# Patient Record
Sex: Female | Born: 1950
Health system: Southern US, Community
[De-identification: ages and names within clinical notes are randomized; demographics above are authoritative.]

## PROBLEM LIST (undated history)

## (undated) DIAGNOSIS — M719 Bursopathy, unspecified: Secondary | ICD-10-CM

## (undated) DIAGNOSIS — M1990 Unspecified osteoarthritis, unspecified site: Secondary | ICD-10-CM

## (undated) DIAGNOSIS — R7303 Prediabetes: Secondary | ICD-10-CM

## (undated) DIAGNOSIS — T7840XA Allergy, unspecified, initial encounter: Secondary | ICD-10-CM

## (undated) DIAGNOSIS — G8929 Other chronic pain: Secondary | ICD-10-CM

## (undated) DIAGNOSIS — K589 Irritable bowel syndrome without diarrhea: Secondary | ICD-10-CM

## (undated) DIAGNOSIS — H269 Unspecified cataract: Secondary | ICD-10-CM

## (undated) DIAGNOSIS — M549 Dorsalgia, unspecified: Secondary | ICD-10-CM

## (undated) DIAGNOSIS — G4700 Insomnia, unspecified: Secondary | ICD-10-CM

## (undated) DIAGNOSIS — R3129 Other microscopic hematuria: Secondary | ICD-10-CM

## (undated) DIAGNOSIS — Z86010 Personal history of colonic polyps: Secondary | ICD-10-CM

## (undated) DIAGNOSIS — K219 Gastro-esophageal reflux disease without esophagitis: Secondary | ICD-10-CM

## (undated) DIAGNOSIS — F411 Generalized anxiety disorder: Secondary | ICD-10-CM

## (undated) DIAGNOSIS — J309 Allergic rhinitis, unspecified: Secondary | ICD-10-CM

## (undated) DIAGNOSIS — R7302 Impaired glucose tolerance (oral): Secondary | ICD-10-CM

## (undated) DIAGNOSIS — G43009 Migraine without aura, not intractable, without status migrainosus: Secondary | ICD-10-CM

## (undated) DIAGNOSIS — I10 Essential (primary) hypertension: Secondary | ICD-10-CM

## (undated) DIAGNOSIS — K5909 Other constipation: Secondary | ICD-10-CM

## (undated) DIAGNOSIS — H3320 Serous retinal detachment, unspecified eye: Secondary | ICD-10-CM

## (undated) DIAGNOSIS — M949 Disorder of cartilage, unspecified: Secondary | ICD-10-CM

## (undated) DIAGNOSIS — E785 Hyperlipidemia, unspecified: Secondary | ICD-10-CM

## (undated) DIAGNOSIS — F329 Major depressive disorder, single episode, unspecified: Secondary | ICD-10-CM

## (undated) DIAGNOSIS — Z9189 Other specified personal risk factors, not elsewhere classified: Secondary | ICD-10-CM

## (undated) DIAGNOSIS — M899 Disorder of bone, unspecified: Secondary | ICD-10-CM

## (undated) DIAGNOSIS — M479 Spondylosis, unspecified: Secondary | ICD-10-CM

## (undated) HISTORY — DX: Hyperlipidemia, unspecified: E78.5

## (undated) HISTORY — DX: Migraine without aura, not intractable, without status migrainosus: G43.009

## (undated) HISTORY — DX: Major depressive disorder, single episode, unspecified: F32.9

## (undated) HISTORY — DX: Allergic rhinitis, unspecified: J30.9

## (undated) HISTORY — PX: WISDOM TOOTH EXTRACTION: SHX21

## (undated) HISTORY — DX: Unspecified cataract: H26.9

## (undated) HISTORY — DX: Unspecified osteoarthritis, unspecified site: M19.90

## (undated) HISTORY — DX: Other microscopic hematuria: R31.29

## (undated) HISTORY — DX: Serous retinal detachment, unspecified eye: H33.20

## (undated) HISTORY — DX: Insomnia, unspecified: G47.00

## (undated) HISTORY — DX: Disorder of cartilage, unspecified: M94.9

## (undated) HISTORY — DX: Other specified personal risk factors, not elsewhere classified: Z91.89

## (undated) HISTORY — DX: Other constipation: K59.09

## (undated) HISTORY — DX: Allergy, unspecified, initial encounter: T78.40XA

## (undated) HISTORY — PX: RETINAL TEAR REPAIR CRYOTHERAPY: SHX5304

## (undated) HISTORY — PX: APPENDECTOMY: SHX54

## (undated) HISTORY — PX: CATARACT EXTRACTION: SUR2

## (undated) HISTORY — DX: Personal history of colonic polyps: Z86.010

## (undated) HISTORY — PX: COLONOSCOPY: SHX174

## (undated) HISTORY — DX: Generalized anxiety disorder: F41.1

## (undated) HISTORY — DX: Spondylosis, unspecified: M47.9

## (undated) HISTORY — DX: Gastro-esophageal reflux disease without esophagitis: K21.9

## (undated) HISTORY — DX: Irritable bowel syndrome without diarrhea: K58.9

## (undated) HISTORY — DX: Disorder of bone, unspecified: M89.9

## (undated) HISTORY — PX: TOTAL ABDOMINAL HYSTERECTOMY: SHX209

## (undated) HISTORY — DX: Impaired glucose tolerance (oral): R73.02

---

## 1998-02-09 ENCOUNTER — Emergency Department (HOSPITAL_COMMUNITY): Admission: EM | Admit: 1998-02-09 | Discharge: 1998-02-09 | Payer: Self-pay | Admitting: Emergency Medicine

## 2000-01-06 ENCOUNTER — Other Ambulatory Visit: Admission: RE | Admit: 2000-01-06 | Discharge: 2000-01-06 | Payer: Self-pay | Admitting: Internal Medicine

## 2002-05-04 ENCOUNTER — Emergency Department (HOSPITAL_COMMUNITY): Admission: EM | Admit: 2002-05-04 | Discharge: 2002-05-04 | Payer: Self-pay | Admitting: Emergency Medicine

## 2002-05-05 ENCOUNTER — Encounter: Payer: Self-pay | Admitting: *Deleted

## 2003-03-17 ENCOUNTER — Emergency Department (HOSPITAL_COMMUNITY): Admission: EM | Admit: 2003-03-17 | Discharge: 2003-03-17 | Payer: Self-pay | Admitting: Emergency Medicine

## 2003-06-28 ENCOUNTER — Encounter: Admission: RE | Admit: 2003-06-28 | Discharge: 2003-06-28 | Payer: Self-pay | Admitting: Internal Medicine

## 2003-06-28 ENCOUNTER — Encounter: Payer: Self-pay | Admitting: Internal Medicine

## 2003-07-07 ENCOUNTER — Encounter: Admission: RE | Admit: 2003-07-07 | Discharge: 2003-07-07 | Payer: Self-pay | Admitting: Internal Medicine

## 2003-10-09 HISTORY — PX: UPPER GASTROINTESTINAL ENDOSCOPY: SHX188

## 2004-09-29 ENCOUNTER — Ambulatory Visit: Payer: Self-pay | Admitting: Internal Medicine

## 2004-10-09 ENCOUNTER — Ambulatory Visit (HOSPITAL_COMMUNITY): Admission: RE | Admit: 2004-10-09 | Discharge: 2004-10-09 | Payer: Self-pay | Admitting: Internal Medicine

## 2004-10-15 ENCOUNTER — Other Ambulatory Visit: Admission: RE | Admit: 2004-10-15 | Discharge: 2004-10-15 | Payer: Self-pay | Admitting: *Deleted

## 2005-02-13 ENCOUNTER — Other Ambulatory Visit: Admission: RE | Admit: 2005-02-13 | Discharge: 2005-02-13 | Payer: Self-pay | Admitting: *Deleted

## 2005-09-22 ENCOUNTER — Ambulatory Visit: Payer: Self-pay | Admitting: Internal Medicine

## 2005-09-23 ENCOUNTER — Ambulatory Visit: Payer: Self-pay | Admitting: Cardiology

## 2006-02-17 ENCOUNTER — Other Ambulatory Visit: Admission: RE | Admit: 2006-02-17 | Discharge: 2006-02-17 | Payer: Self-pay | Admitting: *Deleted

## 2006-06-30 ENCOUNTER — Ambulatory Visit: Payer: Self-pay | Admitting: Internal Medicine

## 2006-07-06 ENCOUNTER — Encounter: Admission: RE | Admit: 2006-07-06 | Discharge: 2006-07-06 | Payer: Self-pay | Admitting: Internal Medicine

## 2006-12-14 ENCOUNTER — Emergency Department (HOSPITAL_COMMUNITY): Admission: EM | Admit: 2006-12-14 | Discharge: 2006-12-14 | Payer: Self-pay | Admitting: Emergency Medicine

## 2006-12-17 ENCOUNTER — Ambulatory Visit (HOSPITAL_COMMUNITY): Admission: RE | Admit: 2006-12-17 | Discharge: 2006-12-17 | Payer: Self-pay | Admitting: Internal Medicine

## 2006-12-17 ENCOUNTER — Ambulatory Visit: Payer: Self-pay | Admitting: Internal Medicine

## 2006-12-24 ENCOUNTER — Encounter: Admission: RE | Admit: 2006-12-24 | Discharge: 2006-12-24 | Payer: Self-pay | Admitting: Internal Medicine

## 2007-02-22 ENCOUNTER — Other Ambulatory Visit: Admission: RE | Admit: 2007-02-22 | Discharge: 2007-02-22 | Payer: Self-pay | Admitting: *Deleted

## 2007-05-13 ENCOUNTER — Encounter: Admission: RE | Admit: 2007-05-13 | Discharge: 2007-05-13 | Payer: Self-pay | Admitting: *Deleted

## 2007-06-22 ENCOUNTER — Ambulatory Visit: Payer: Self-pay | Admitting: Internal Medicine

## 2007-06-22 LAB — CONVERTED CEMR LAB
ALT: 29 units/L (ref 0–35)
AST: 24 units/L (ref 0–37)
Albumin: 4 g/dL (ref 3.5–5.2)
Alkaline Phosphatase: 102 units/L (ref 39–117)
Basophils Absolute: 0.1 10*3/uL (ref 0.0–0.1)
Basophils Relative: 0.7 % (ref 0.0–1.0)
Calcium: 9.4 mg/dL (ref 8.4–10.5)
Chloride: 108 meq/L (ref 96–112)
Crystals: NEGATIVE
Direct LDL: 178.5 mg/dL
GFR calc non Af Amer: 79 mL/min
HCT: 39.7 % (ref 36.0–46.0)
MCHC: 35.8 g/dL (ref 30.0–36.0)
Neutrophils Relative %: 55.8 % (ref 43.0–77.0)
RBC: 4.61 M/uL (ref 3.87–5.11)
RDW: 12.8 % (ref 11.5–14.6)
Specific Gravity, Urine: 1.025 (ref 1.000–1.03)
Total CHOL/HDL Ratio: 6.6
Total Protein, Urine: NEGATIVE mg/dL
Triglycerides: 247 mg/dL (ref 0–149)
VLDL: 49 mg/dL — ABNORMAL HIGH (ref 0–40)
pH: 6 (ref 5.0–8.0)

## 2007-06-23 ENCOUNTER — Encounter: Payer: Self-pay | Admitting: Internal Medicine

## 2007-06-23 DIAGNOSIS — F32A Depression, unspecified: Secondary | ICD-10-CM | POA: Insufficient documentation

## 2007-06-23 DIAGNOSIS — M479 Spondylosis, unspecified: Secondary | ICD-10-CM

## 2007-06-23 DIAGNOSIS — F411 Generalized anxiety disorder: Secondary | ICD-10-CM | POA: Insufficient documentation

## 2007-06-23 DIAGNOSIS — F3289 Other specified depressive episodes: Secondary | ICD-10-CM

## 2007-06-23 DIAGNOSIS — M545 Low back pain, unspecified: Secondary | ICD-10-CM | POA: Insufficient documentation

## 2007-06-23 DIAGNOSIS — E785 Hyperlipidemia, unspecified: Secondary | ICD-10-CM

## 2007-06-23 DIAGNOSIS — G43009 Migraine without aura, not intractable, without status migrainosus: Secondary | ICD-10-CM

## 2007-06-23 DIAGNOSIS — F329 Major depressive disorder, single episode, unspecified: Secondary | ICD-10-CM

## 2007-06-23 DIAGNOSIS — Z9189 Other specified personal risk factors, not elsewhere classified: Secondary | ICD-10-CM | POA: Insufficient documentation

## 2007-06-23 DIAGNOSIS — K219 Gastro-esophageal reflux disease without esophagitis: Secondary | ICD-10-CM | POA: Insufficient documentation

## 2007-06-23 DIAGNOSIS — K589 Irritable bowel syndrome without diarrhea: Secondary | ICD-10-CM | POA: Insufficient documentation

## 2007-06-23 DIAGNOSIS — R1084 Generalized abdominal pain: Secondary | ICD-10-CM | POA: Insufficient documentation

## 2007-06-23 HISTORY — DX: Spondylosis, unspecified: M47.9

## 2007-06-23 HISTORY — DX: Gastro-esophageal reflux disease without esophagitis: K21.9

## 2007-06-23 HISTORY — DX: Major depressive disorder, single episode, unspecified: F32.9

## 2007-06-23 HISTORY — DX: Other specified depressive episodes: F32.89

## 2007-06-23 HISTORY — DX: Other specified personal risk factors, not elsewhere classified: Z91.89

## 2007-06-23 HISTORY — DX: Irritable bowel syndrome, unspecified: K58.9

## 2007-06-23 HISTORY — DX: Generalized anxiety disorder: F41.1

## 2007-06-23 HISTORY — DX: Migraine without aura, not intractable, without status migrainosus: G43.009

## 2007-06-23 HISTORY — DX: Hyperlipidemia, unspecified: E78.5

## 2007-06-28 ENCOUNTER — Encounter: Admission: RE | Admit: 2007-06-28 | Discharge: 2007-06-28 | Payer: Self-pay | Admitting: Internal Medicine

## 2007-06-30 ENCOUNTER — Telehealth: Payer: Self-pay | Admitting: Internal Medicine

## 2007-07-22 ENCOUNTER — Telehealth (INDEPENDENT_AMBULATORY_CARE_PROVIDER_SITE_OTHER): Payer: Self-pay | Admitting: *Deleted

## 2007-07-25 ENCOUNTER — Telehealth (INDEPENDENT_AMBULATORY_CARE_PROVIDER_SITE_OTHER): Payer: Self-pay | Admitting: *Deleted

## 2007-07-25 ENCOUNTER — Encounter: Admission: RE | Admit: 2007-07-25 | Discharge: 2007-07-25 | Payer: Self-pay | Admitting: Internal Medicine

## 2007-08-25 ENCOUNTER — Ambulatory Visit: Payer: Self-pay | Admitting: Internal Medicine

## 2007-08-25 LAB — CONVERTED CEMR LAB
Albumin: 3.7 g/dL (ref 3.5–5.2)
Direct LDL: 129.2 mg/dL
HDL: 40.4 mg/dL (ref >39.0)
Total Bilirubin: 0.6 mg/dL (ref 0.3–1.2)
Triglycerides: 314 mg/dL (ref 0–149)
VLDL: 63 mg/dL — ABNORMAL HIGH (ref 0–40)

## 2007-08-30 ENCOUNTER — Ambulatory Visit: Payer: Self-pay | Admitting: Internal Medicine

## 2007-08-30 DIAGNOSIS — M65849 Other synovitis and tenosynovitis, unspecified hand: Secondary | ICD-10-CM

## 2007-08-30 DIAGNOSIS — R945 Abnormal results of liver function studies: Secondary | ICD-10-CM | POA: Insufficient documentation

## 2007-08-30 DIAGNOSIS — M65839 Other synovitis and tenosynovitis, unspecified forearm: Secondary | ICD-10-CM | POA: Insufficient documentation

## 2007-08-30 DIAGNOSIS — IMO0001 Reserved for inherently not codable concepts without codable children: Secondary | ICD-10-CM | POA: Insufficient documentation

## 2007-11-24 ENCOUNTER — Ambulatory Visit: Payer: Self-pay | Admitting: Internal Medicine

## 2007-11-24 LAB — CONVERTED CEMR LAB
Alkaline Phosphatase: 89 units/L (ref 39–117)
Cholesterol: 158 mg/dL (ref 0–200)
Total Bilirubin: 0.7 mg/dL (ref 0.3–1.2)
Total CHOL/HDL Ratio: 4.4
Total Protein: 6.6 g/dL (ref 6.0–8.3)

## 2007-12-02 ENCOUNTER — Ambulatory Visit: Payer: Self-pay | Admitting: Internal Medicine

## 2007-12-06 ENCOUNTER — Telehealth (INDEPENDENT_AMBULATORY_CARE_PROVIDER_SITE_OTHER): Payer: Self-pay | Admitting: *Deleted

## 2007-12-07 ENCOUNTER — Ambulatory Visit: Payer: Self-pay | Admitting: Internal Medicine

## 2007-12-07 DIAGNOSIS — M79609 Pain in unspecified limb: Secondary | ICD-10-CM | POA: Insufficient documentation

## 2007-12-07 DIAGNOSIS — M549 Dorsalgia, unspecified: Secondary | ICD-10-CM | POA: Insufficient documentation

## 2007-12-16 ENCOUNTER — Encounter: Admission: RE | Admit: 2007-12-16 | Discharge: 2007-12-16 | Payer: Self-pay | Admitting: Internal Medicine

## 2008-01-26 ENCOUNTER — Encounter: Payer: Self-pay | Admitting: Internal Medicine

## 2008-04-24 ENCOUNTER — Other Ambulatory Visit: Admission: RE | Admit: 2008-04-24 | Discharge: 2008-04-24 | Payer: Self-pay | Admitting: Gynecology
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
## 2008-06-20 ENCOUNTER — Ambulatory Visit: Payer: Self-pay | Admitting: Internal Medicine

## 2008-06-20 LAB — CONVERTED CEMR LAB
ALT: 29 units/L (ref 0–35)
Albumin: 3.8 g/dL (ref 3.5–5.2)
Alkaline Phosphatase: 109 units/L (ref 39–117)
BUN: 13 mg/dL (ref 6–23)
Basophils Relative: 0.5 % (ref 0.0–3.0)
Bilirubin Urine: NEGATIVE
CO2: 28 meq/L (ref 19–32)
Calcium: 9.3 mg/dL (ref 8.4–10.5)
Crystals: NEGATIVE
Eosinophils Relative: 1.4 % (ref 0.0–5.0)
GFR calc Af Amer: 84 mL/min
Glucose, Bld: 114 mg/dL — ABNORMAL HIGH (ref 70–99)
HCT: 41.3 % (ref 36.0–46.0)
Hemoglobin: 14.8 g/dL (ref 12.0–15.0)
Monocytes Absolute: 0.4 10*3/uL (ref 0.1–1.0)
Monocytes Relative: 7.2 % (ref 3.0–12.0)
Neutro Abs: 3.4 10*3/uL (ref 1.4–7.7)
Nitrite: NEGATIVE
Potassium: 4.4 meq/L (ref 3.5–5.1)
RBC: 4.79 M/uL (ref 3.87–5.11)
Specific Gravity, Urine: 1.015 (ref 1.000–1.03)
TSH: 1 microintl units/mL (ref 0.35–5.50)
Total Protein, Urine: NEGATIVE mg/dL
Total Protein: 6.9 g/dL (ref 6.0–8.3)
Triglycerides: 326 mg/dL (ref 0–149)
WBC: 6 10*3/uL (ref 4.5–10.5)
pH: 5.5 (ref 5.0–8.0)

## 2008-06-27 ENCOUNTER — Ambulatory Visit: Payer: Self-pay | Admitting: Internal Medicine

## 2008-06-27 DIAGNOSIS — Z860100 Personal history of colon polyps, unspecified: Secondary | ICD-10-CM | POA: Insufficient documentation

## 2008-06-27 DIAGNOSIS — J069 Acute upper respiratory infection, unspecified: Secondary | ICD-10-CM | POA: Insufficient documentation

## 2008-06-27 DIAGNOSIS — E669 Obesity, unspecified: Secondary | ICD-10-CM | POA: Insufficient documentation

## 2008-06-27 DIAGNOSIS — N959 Unspecified menopausal and perimenopausal disorder: Secondary | ICD-10-CM | POA: Insufficient documentation

## 2008-06-27 DIAGNOSIS — E739 Lactose intolerance, unspecified: Secondary | ICD-10-CM | POA: Insufficient documentation

## 2008-06-27 HISTORY — DX: Personal history of colonic polyps: Z86.010

## 2008-06-27 HISTORY — DX: Personal history of colon polyps, unspecified: Z86.0100

## 2008-06-29 ENCOUNTER — Ambulatory Visit: Payer: Self-pay | Admitting: Internal Medicine

## 2008-06-29 ENCOUNTER — Encounter: Payer: Self-pay | Admitting: Internal Medicine

## 2008-07-09 ENCOUNTER — Ambulatory Visit: Payer: Self-pay | Admitting: Gastroenterology

## 2008-07-11 ENCOUNTER — Encounter: Payer: Self-pay | Admitting: Internal Medicine

## 2008-07-11 DIAGNOSIS — M899 Disorder of bone, unspecified: Secondary | ICD-10-CM

## 2008-07-11 DIAGNOSIS — M949 Disorder of cartilage, unspecified: Secondary | ICD-10-CM

## 2008-07-11 HISTORY — DX: Disorder of bone, unspecified: M89.9

## 2008-07-19 ENCOUNTER — Encounter: Payer: Self-pay | Admitting: Gastroenterology

## 2008-07-19 ENCOUNTER — Ambulatory Visit: Payer: Self-pay | Admitting: Gastroenterology

## 2008-07-23 ENCOUNTER — Encounter: Payer: Self-pay | Admitting: Gastroenterology

## 2008-07-25 ENCOUNTER — Encounter: Admission: RE | Admit: 2008-07-25 | Discharge: 2008-07-25 | Payer: Self-pay | Admitting: Internal Medicine

## 2008-08-07 ENCOUNTER — Telehealth: Payer: Self-pay | Admitting: Internal Medicine

## 2009-01-29 ENCOUNTER — Telehealth (INDEPENDENT_AMBULATORY_CARE_PROVIDER_SITE_OTHER): Payer: Self-pay | Admitting: *Deleted

## 2009-04-30 ENCOUNTER — Ambulatory Visit: Payer: Self-pay | Admitting: Internal Medicine

## 2009-04-30 DIAGNOSIS — K5289 Other specified noninfective gastroenteritis and colitis: Secondary | ICD-10-CM | POA: Insufficient documentation

## 2009-05-15 ENCOUNTER — Emergency Department (HOSPITAL_COMMUNITY): Admission: EM | Admit: 2009-05-15 | Discharge: 2009-05-15 | Payer: Self-pay | Admitting: Emergency Medicine

## 2009-05-20 ENCOUNTER — Ambulatory Visit: Payer: Self-pay | Admitting: Internal Medicine

## 2009-05-20 DIAGNOSIS — R42 Dizziness and giddiness: Secondary | ICD-10-CM | POA: Insufficient documentation

## 2009-05-20 DIAGNOSIS — R112 Nausea with vomiting, unspecified: Secondary | ICD-10-CM | POA: Insufficient documentation

## 2009-05-20 DIAGNOSIS — E860 Dehydration: Secondary | ICD-10-CM | POA: Insufficient documentation

## 2009-06-03 ENCOUNTER — Encounter: Payer: Self-pay | Admitting: Internal Medicine

## 2009-06-04 ENCOUNTER — Ambulatory Visit: Payer: Self-pay | Admitting: Internal Medicine

## 2009-06-04 DIAGNOSIS — R110 Nausea: Secondary | ICD-10-CM | POA: Insufficient documentation

## 2009-06-10 ENCOUNTER — Telehealth: Payer: Self-pay | Admitting: Internal Medicine

## 2009-06-11 ENCOUNTER — Telehealth: Payer: Self-pay | Admitting: Internal Medicine

## 2009-06-13 ENCOUNTER — Encounter: Payer: Self-pay | Admitting: Internal Medicine

## 2009-06-21 ENCOUNTER — Ambulatory Visit: Payer: Self-pay | Admitting: Internal Medicine

## 2009-06-21 LAB — CONVERTED CEMR LAB
ALT: 28 units/L (ref 0–35)
AST: 19 units/L (ref 0–37)
Albumin: 4.1 g/dL (ref 3.5–5.2)
Alkaline Phosphatase: 102 units/L (ref 39–117)
Basophils Relative: 0.6 % (ref 0.0–3.0)
Bilirubin, Direct: 0.1 mg/dL (ref 0.0–0.3)
CO2: 26 meq/L (ref 19–32)
Calcium: 9.1 mg/dL (ref 8.4–10.5)
Creatinine, Ser: 0.8 mg/dL (ref 0.4–1.2)
Direct LDL: 178.5 mg/dL
Eosinophils Relative: 1.2 % (ref 0.0–5.0)
GFR calc non Af Amer: 78.74 mL/min (ref >60)
Hemoglobin: 13.8 g/dL (ref 12.0–15.0)
Ketones, ur: NEGATIVE mg/dL
Lymphocytes Relative: 44.3 % (ref 12.0–46.0)
MCHC: 34.9 g/dL (ref 30.0–36.0)
Monocytes Relative: 8 % (ref 3.0–12.0)
Neutro Abs: 2.2 10*3/uL (ref 1.4–7.7)
Neutrophils Relative %: 45.9 % (ref 43.0–77.0)
RBC: 4.55 M/uL (ref 3.87–5.11)
Sodium: 138 meq/L (ref 135–145)
Total CHOL/HDL Ratio: 6
Total Protein, Urine: NEGATIVE mg/dL
Total Protein: 7.1 g/dL (ref 6.0–8.3)
Triglycerides: 196 mg/dL — ABNORMAL HIGH (ref 0.0–149.0)
Urine Glucose: NEGATIVE mg/dL
Urobilinogen, UA: 0.2 (ref 0.0–1.0)
VLDL: 39.2 mg/dL (ref 0.0–40.0)
WBC: 4.9 10*3/uL (ref 4.5–10.5)

## 2009-06-26 ENCOUNTER — Ambulatory Visit: Payer: Self-pay | Admitting: Internal Medicine

## 2009-06-26 DIAGNOSIS — G4700 Insomnia, unspecified: Secondary | ICD-10-CM

## 2009-06-26 DIAGNOSIS — N390 Urinary tract infection, site not specified: Secondary | ICD-10-CM | POA: Insufficient documentation

## 2009-06-26 HISTORY — DX: Insomnia, unspecified: G47.00

## 2009-09-24 ENCOUNTER — Telehealth: Payer: Self-pay | Admitting: Internal Medicine

## 2009-10-03 ENCOUNTER — Telehealth: Payer: Self-pay | Admitting: Internal Medicine

## 2009-10-10 ENCOUNTER — Encounter: Payer: Self-pay | Admitting: Internal Medicine

## 2009-12-21 ENCOUNTER — Emergency Department (HOSPITAL_COMMUNITY): Admission: EM | Admit: 2009-12-21 | Discharge: 2009-12-21 | Payer: Self-pay | Admitting: Emergency Medicine

## 2009-12-21 ENCOUNTER — Encounter: Payer: Self-pay | Admitting: Internal Medicine

## 2009-12-23 ENCOUNTER — Telehealth: Payer: Self-pay | Admitting: Internal Medicine

## 2009-12-24 ENCOUNTER — Ambulatory Visit: Payer: Self-pay | Admitting: Internal Medicine

## 2010-03-14 ENCOUNTER — Ambulatory Visit: Payer: Self-pay | Admitting: Internal Medicine

## 2010-05-14 ENCOUNTER — Ambulatory Visit: Payer: Self-pay | Admitting: Internal Medicine

## 2010-05-14 DIAGNOSIS — L989 Disorder of the skin and subcutaneous tissue, unspecified: Secondary | ICD-10-CM | POA: Insufficient documentation

## 2010-06-27 ENCOUNTER — Ambulatory Visit: Payer: Self-pay | Admitting: Internal Medicine

## 2010-06-27 LAB — CONVERTED CEMR LAB
ALT: 49 units/L — ABNORMAL HIGH (ref 0–35)
AST: 33 units/L (ref 0–37)
Alkaline Phosphatase: 113 units/L (ref 39–117)
Basophils Absolute: 0 10*3/uL (ref 0.0–0.1)
Bilirubin, Direct: 0.1 mg/dL (ref 0.0–0.3)
CO2: 30 meq/L (ref 19–32)
Chloride: 104 meq/L (ref 96–112)
Eosinophils Absolute: 0.1 10*3/uL (ref 0.0–0.7)
Hemoglobin: 14.3 g/dL (ref 12.0–15.0)
Ketones, ur: NEGATIVE mg/dL
Leukocytes, UA: NEGATIVE
Lymphocytes Relative: 40.9 % (ref 12.0–46.0)
MCHC: 35.4 g/dL (ref 30.0–36.0)
Monocytes Relative: 8.3 % (ref 3.0–12.0)
Neutrophils Relative %: 48.9 % (ref 43.0–77.0)
Nitrite: NEGATIVE
Potassium: 4.5 meq/L (ref 3.5–5.1)
RBC: 4.69 M/uL (ref 3.87–5.11)
RDW: 13.5 % (ref 11.5–14.6)
Specific Gravity, Urine: 1.02 (ref 1.000–1.030)
Total Bilirubin: 0.5 mg/dL (ref 0.3–1.2)
Total CHOL/HDL Ratio: 5
Total Protein: 6.8 g/dL (ref 6.0–8.3)
Urobilinogen, UA: 0.2 (ref 0.0–1.0)
VLDL: 79.2 mg/dL — ABNORMAL HIGH (ref 0.0–40.0)

## 2010-07-04 ENCOUNTER — Encounter: Payer: Self-pay | Admitting: Internal Medicine

## 2010-07-04 ENCOUNTER — Ambulatory Visit: Payer: Self-pay | Admitting: Internal Medicine

## 2010-09-10 ENCOUNTER — Ambulatory Visit
Admission: RE | Admit: 2010-09-10 | Discharge: 2010-09-10 | Payer: Self-pay | Source: Home / Self Care | Attending: Internal Medicine | Admitting: Internal Medicine

## 2010-09-10 DIAGNOSIS — R51 Headache: Secondary | ICD-10-CM | POA: Insufficient documentation

## 2010-09-10 DIAGNOSIS — R519 Headache, unspecified: Secondary | ICD-10-CM | POA: Insufficient documentation

## 2010-10-07 NOTE — Assessment & Plan Note (Signed)
Summary: needs referral/cd   Vital Signs:  Patient profile:   60 year old female Height:      64 inches Weight:      144.50 pounds BMI:     24.89 O2 Sat:      98 % on Room air Temp:     98.1 degrees F oral Pulse rate:   83 / minute BP sitting:   132 / 82  (left arm) Cuff size:   regular  Vitals Entered ByZella Ball Ewing (December 24, 2009 11:37 AM)  O2 Flow:  Room air  CC: Stomach problems occurring again, went to ER, followup/RE   Primary Care Provider:  Corwin Levins MD  CC:  Stomach problems occurring again, went to ER, and followup/RE.  History of Present Illness: here after gradual incrased abd pain as per HPI in sept 2010  since friday last - burping, nausea, pain  - seen at urgent care 3 days ago with pos H pylori ab; overall improved today symtpoms;  husband called oncall - talked to Dr Samuel Jester recently who after extended discussion advised going to ER;  did see ER - dr Rennis Chris and convinced him to do tx with PCN/macrolide, adn overall seems improved today;  has hx of chronic constipation, and colon polyps;  last colonscopy 2009.  ;  did not have recent CT scan;  she is requesting f/u with GI;  has been having increased stress recently;  takes of the 24 amitiza to get the bowels to move.  Alprazolam helps the stress - agrees that stress has been a factor for her lately and feels overwhelmed at times, but no worsening depressive symtpoms or suicidal ideation.    Preventive Screening-Counseling & Management      Drug Use:  no.    Problems Prior to Update: 1)  Insomnia-sleep Disorder-unspec  (ICD-780.52) 2)  Uti  (ICD-599.0) 3)  Nausea Alone  (ICD-787.02) 4)  Dehydration  (ICD-276.51) 5)  Nausea and Vomiting  (ICD-787.01) 6)  Intermittent Vertigo  (ICD-780.4) 7)  Gastroenteritis  (ICD-558.9) 8)  Osteopenia  (ICD-733.90) 9)  Menopausal Disorder  (ICD-627.9) 10)  Colonic Polyps, Hx of  (ICD-V12.72) 11)  Glucose Intolerance  (ICD-271.3) 12)  Obesity, Unspecified   (ICD-278.00) 13)  Uri  (ICD-465.9) 14)  Preventive Health Care  (ICD-V70.0) 15)  Leg Pain, Right  (ICD-729.5) 16)  Back Pain  (ICD-724.5) 17)  Liver Function Tests, Abnormal  (ICD-794.8) 18)  Myalgia  (ICD-729.1) 19)  Other Tenosynovitis of Hand and Wrist  (ICD-727.05) 20)  Abdominal Pain, Generalized  (ICD-789.07) 21)  Preventive Health Care  (ICD-V70.0) 22)  Family History of Cad Female 1st Degree Relative <60  (ICD-V16.49) 23)  Osteoarthritis, Lumbar Spine  (ICD-721.90) 24)  Low Back Pain  (ICD-724.2) 25)  Breast Biopsy, Hx of  (ICD-V15.9) 26)  Common Migraine  (ICD-346.10) 27)  Depression  (ICD-311) 28)  Hyperlipidemia  (ICD-272.4) 29)  Gerd  (ICD-530.81) 30)  Anxiety  (ICD-300.00) 31)  Irritable Bowel Syndrome  (ICD-564.1)  Medications Prior to Update: 1)  Omeprazole 20 Mg Cpdr (Omeprazole) .Marland Kitchen.. 1 By Mouth Two Times A Day 2)  Alprazolam 0.5 Mg  Tabs (Alprazolam) .Marland Kitchen.. 1 By Mouth Two Times A Day As Needed 3)  Lipitor 20 Mg  Tabs (Atorvastatin Calcium) .Marland Kitchen.. 1 By Mouth Once Daily 4)  Darvocet-N 100 100-650 Mg Tabs (Propoxyphene N-Apap) .Marland Kitchen.. 1po Three Times A Day As Needed 5)  Amitiza 24 Mcg Caps (Lubiprostone) .Marland Kitchen.. 1po Two Times A Day 6)  Septra  Ds 800-160 Mg Tabs (Sulfamethoxazole-Trimethoprim) .Marland Kitchen.. 1 By Mouth Two Times A Day 7)  Zolpidem Tartrate 10 Mg Tabs (Zolpidem Tartrate) .Marland Kitchen.. 1 By Mouth At Bedtime As Needed For Sleep  Current Medications (verified): 1)  Omeprazole 20 Mg Cpdr (Omeprazole) .Marland Kitchen.. 1 By Mouth Two Times A Day 2)  Alprazolam 1 Mg Tabs (Alprazolam) .Marland Kitchen.. 1 By Mouth Two Times A Day As Needed 3)  Lipitor 20 Mg  Tabs (Atorvastatin Calcium) .Marland Kitchen.. 1 By Mouth Once Daily 4)  Amitiza 24 Mcg Caps (Lubiprostone) .Marland Kitchen.. 1po Two Times A Day 5)  Zolpidem Tartrate 10 Mg Tabs (Zolpidem Tartrate) .Marland Kitchen.. 1 By Mouth At Bedtime As Needed For Sleep 6)  Sertraline Hcl 50 Mg Tabs (Sertraline Hcl) .Marland Kitchen.. 1p O Once Daily  Allergies (verified): 1)  ! Codeine  Past History:  Past Medical  History: Last updated: 07/11/2008 IBS Anxiety GERD chronic constipation Hyperlipidemia Depression migraine Low back pain/LS spine djd Colonic polyps, hx of Osteopenia  Past Surgical History: Last updated: 06/23/2007 left breast biopsy x 2 Hysterectomy Appendectomy  Social History: Last updated: 12/24/2009 Alcohol use-yes - occas to rare Married 2 children quit smoking 04/15/09!!! Drug use-no  Risk Factors: Smoking Status: current (06/27/2008)  Social History: Alcohol use-yes - occas to rare Married 2 children quit smoking 04/15/09!!! Drug use-no Drug Use:  no  Review of Systems       all otherwise negative per pt -    Physical Exam  General:  alert and well-developed.   Head:  normocephalic and atraumatic.   Eyes:  vision grossly intact, pupils equal, and pupils round.   Ears:  R ear normal and L ear normal.   Nose:  no external deformity and no nasal discharge.   Mouth:  no gingival abnormalities and pharynx pink and moist.   Neck:  supple and no masses.   Lungs:  normal respiratory effort and normal breath sounds.   Heart:  normal rate and regular rhythm.   Abdomen:  soft, non-tender, and normal bowel sounds.   Extremities:  no edema, no erythema  Psych:  not depressed appearing and moderately anxious.     Impression & Recommendations:  Problem # 1:  ABDOMINAL PAIN, GENERALIZED (ICD-789.07)  Her updated medication list for this problem includes:    Amitiza 24 Mcg Caps (Lubiprostone) .Marland Kitchen... 1po two times a day Continue all previous medications as before this visit , I suspect prob functional ,  doubt h pylori recurrence but already startedon antibx and seems to be improved; will hold on GI further eval such as breath test, finish antibx, f/u any worsening symptoms  Problem # 2:  GERD (ICD-530.81)  Her updated medication list for this problem includes:    Omeprazole 20 Mg Cpdr (Omeprazole) .Marland Kitchen... 1 by mouth two times a day stable overall by hx and exam,  ok to continue meds/tx as is   Problem # 3:  ANXIETY (ICD-300.00)  Her updated medication list for this problem includes:    Alprazolam 1 Mg Tabs (Alprazolam) .Marland Kitchen... 1 by mouth two times a day as needed    Sertraline Hcl 50 Mg Tabs (Sertraline hcl) .Marland Kitchen... 1p o once daily treat as above, f/u any worsening signs or symptoms   Complete Medication List: 1)  Omeprazole 20 Mg Cpdr (Omeprazole) .Marland Kitchen.. 1 by mouth two times a day 2)  Alprazolam 1 Mg Tabs (Alprazolam) .Marland Kitchen.. 1 by mouth two times a day as needed 3)  Lipitor 20 Mg Tabs (Atorvastatin calcium) .Marland Kitchen.. 1 by mouth once daily  4)  Amitiza 24 Mcg Caps (Lubiprostone) .Marland Kitchen.. 1po two times a day 5)  Zolpidem Tartrate 10 Mg Tabs (Zolpidem tartrate) .Marland Kitchen.. 1 by mouth at bedtime as needed for sleep 6)  Sertraline Hcl 50 Mg Tabs (Sertraline hcl) .Marland Kitchen.. 1p o once daily  Patient Instructions: 1)  Continue all previous medications as before this visit 2)  increse the alprazolam to 1 mg two times a day as needed  3)  start the sertraline 50 mg per day 4)  call in 3 to 4 wks to incresae the sertraline if not working well enough 5)  call if the abd symtpoms return to consider GI referrral  6)  Please schedule a follow-up appointment in 6 months with CPX labs  Prescriptions: SERTRALINE HCL 50 MG TABS (SERTRALINE HCL) 1p o once daily  #30 x 11   Entered and Authorized by:   Corwin Levins MD   Signed by:   Corwin Levins MD on 12/24/2009   Method used:   Print then Give to Patient   RxID:   [id number] ALPRAZOLAM 1 MG TABS (ALPRAZOLAM) 1 by mouth two times a day as needed  #60 x 1   Entered and Authorized by:   Corwin Levins MD   Signed by:   Corwin Levins MD on 12/24/2009   Method used:   Print then Give to Patient   RxID:   [id number]

## 2010-10-07 NOTE — Letter (Signed)
Summary: [facility]  [facility]   Imported By: Sherian Rein 10/14/2009 13:25:34  _____________________________________________________________________  External Attachment:    Type:   Image     Comment:   External Document

## 2010-10-07 NOTE — Assessment & Plan Note (Signed)
Summary: CPX/ NWS  #   Vital Signs:  Patient profile:   60 year old female Height:      65 inches Weight:      169.75 pounds BMI:     28.35 O2 Sat:      98 % on Room air Temp:     98.7 degrees F oral Pulse rate:   75 / minute BP sitting:   146 / 80  (left arm) Cuff size:   large  Vitals Entered By: Margaret Pyle, CMA (July 04, 2010 1:05 PM)  O2 Flow:  Room air  Preventive Care Screening     will get next dxa with GYN and mammogram next mo  CC: CPX - cold sxs x 1 week, ST Cough nasal and chest congestion   Primary Care Provider:  Corwin Levins MD  CC:  CPX - cold sxs x 1 week and ST Cough nasal and chest congestion.  History of Present Illness: here for wellness, incidetnly with acute onst midl to mod 1-2 days marked ST, slight cough, fever, without  productivity and Pt denies CP, worsening sob, doe, wheezing, orthopnea, pnd, worsening LE edema, palps, dizziness or syncope  Pt denies new neuro symptoms such as headache, facial or extremity weakness  Pt denies polydipsia, polyuria.  Overall good compliance with meds, trying to follow low chol diet, wt stable and overall increase from a yr ago when she quit smoking.  Quite frustrated at her devotion to diet adn excericse at the gym dialy and cant seem to lose further wt.  Pt states good ability with ADL's, low fall risk, home safety reviewed and adequate, no significant change in hearing or vision.  Problems Prior to Update: 1)  Pharyngitis-acute  (ICD-462) 2)  Skin Lesion  (ICD-709.9) 3)  Back Pain  (ICD-724.5) 4)  Insomnia-sleep Disorder-unspec  (ICD-780.52) 5)  Uti  (ICD-599.0) 6)  Nausea Alone  (ICD-787.02) 7)  Dehydration  (ICD-276.51) 8)  Nausea and Vomiting  (ICD-787.01) 9)  Intermittent Vertigo  (ICD-780.4) 10)  Gastroenteritis  (ICD-558.9) 11)  Osteopenia  (ICD-733.90) 12)  Menopausal Disorder  (ICD-627.9) 13)  Colonic Polyps, Hx of  (ICD-V12.72) 14)  Glucose Intolerance  (ICD-271.3) 15)  Obesity,  Unspecified  (ICD-278.00) 16)  Uri  (ICD-465.9) 17)  Preventive Health Care  (ICD-V70.0) 18)  Leg Pain, Right  (ICD-729.5) 19)  Back Pain  (ICD-724.5) 20)  Liver Function Tests, Abnormal  (ICD-794.8) 21)  Myalgia  (ICD-729.1) 22)  Other Tenosynovitis of Hand and Wrist  (ICD-727.05) 23)  Abdominal Pain, Generalized  (ICD-789.07) 24)  Preventive Health Care  (ICD-V70.0) 25)  Family History of Cad Female 1st Degree Relative <60  (ICD-V16.49) 26)  Osteoarthritis, Lumbar Spine  (ICD-721.90) 27)  Low Back Pain  (ICD-724.2) 28)  Breast Biopsy, Hx of  (ICD-V15.9) 29)  Common Migraine  (ICD-346.10) 30)  Depression  (ICD-311) 31)  Hyperlipidemia  (ICD-272.4) 32)  Gerd  (ICD-530.81) 33)  Anxiety  (ICD-300.00) 34)  Irritable Bowel Syndrome  (ICD-564.1)  Medications Prior to Update: 1)  Omeprazole 20 Mg Cpdr (Omeprazole) .Marland Kitchen.. 1 By Mouth Two Times A Day 2)  Alprazolam 1 Mg Tabs (Alprazolam) .Marland Kitchen.. 1 By Mouth Two Times A Day As Needed 3)  Lipitor 20 Mg  Tabs (Atorvastatin Calcium) .Marland Kitchen.. 1 By Mouth Once Daily 4)  Zolpidem Tartrate 10 Mg Tabs (Zolpidem Tartrate) .Marland Kitchen.. 1 By Mouth At Bedtime As Needed For Sleep 5)  Hydrocodone-Acetaminophen 7.5-325 Mg Tabs (Hydrocodone-Acetaminophen) .Marland Kitchen.. 1po Q 6 Hrs As Needed Pain 6)  Flexeril 5 Mg Tabs (Cyclobenzaprine Hcl) .Marland Kitchen.. 1po Three Times A Day As Needed Pain 7)  Prednisone 10 Mg Tabs (Prednisone) .... 3po Qd For 3days, Then 2po Qd For 3days, Then 1po Qd For 3days, Then Stop  Current Medications (verified): 1)  Omeprazole 20 Mg Cpdr (Omeprazole) .Marland Kitchen.. 1 By Mouth Two Times A Day 2)  Alprazolam 1 Mg Tabs (Alprazolam) .Marland Kitchen.. 1 By Mouth Two Times A Day As Needed 3)  Lipitor 20 Mg  Tabs (Atorvastatin Calcium) .Marland Kitchen.. 1 By Mouth Once Daily - Generic 4)  Zolpidem Tartrate 10 Mg Tabs (Zolpidem Tartrate) .Marland Kitchen.. 1 By Mouth At Bedtime As Needed For Sleep 5)  Azithromycin 250 Mg Tabs (Azithromycin) .... 2po Qd For 1 Day, Then 1po Qd For 4days, Then Stop 6)  Aspir-Low 81 Mg Tbec  (Aspirin) .Marland Kitchen.. 1 By Mouth Once Daily 7)  Sertraline Hcl 50 Mg Tabs (Sertraline Hcl) .Marland Kitchen.. 1 By Mouth Once Daily 8)  Amitiza 24 Mcg Caps (Lubiprostone) .Marland Kitchen.. 1 By Mouth Two Times A Day  Allergies (verified): 1)  ! Codeine  Past History:  Past Medical History: Last updated: 07/11/2008 IBS Anxiety GERD chronic constipation Hyperlipidemia Depression migraine Low back pain/LS spine djd Colonic polyps, hx of Osteopenia  Past Surgical History: Last updated: 06/23/2007 left breast biopsy x 2 Hysterectomy Appendectomy  Family History: Last updated: 06/27/2008 father with MI in early 48's, HTN mother with GBMF/brain tumor grandmother died with brain tumor  Social History: Last updated: 12/24/2009 Alcohol use-yes - occas to rare Married 2 children quit smoking 04/15/09!!! Drug use-no  Risk Factors: Smoking Status: current (06/27/2008)  Review of Systems  The patient denies anorexia, fever, vision loss, decreased hearing, hoarseness, chest pain, syncope, dyspnea on exertion, peripheral edema, prolonged cough, headaches, hemoptysis, abdominal pain, melena, hematochezia, severe indigestion/heartburn, hematuria, muscle weakness, suspicious skin lesions, transient blindness, difficulty walking, depression, unusual weight change, abnormal bleeding, enlarged lymph nodes, and angioedema.         all otherwise negative per pt -  has signficant work stress, not sure of job security overall , anxious, with hot flashes; also with ongoing chronic constipation  Physical Exam  General:  alert and overweight-appearing  Head:  normocephalic and atraumatic.   Eyes:  vision grossly intact, pupils equal, and pupils round.   Ears:  R ear normal and L ear normal.   Nose:  no external deformity and no nasal discharge.   Mouth:  pharyngeal erythema and fair dentition.   Neck:  supple and no masses.   Lungs:  normal respiratory effort and normal breath sounds.   Heart:  normal rate and regular  rhythm.   Abdomen:  soft, non-tender, and normal bowel sounds.   Msk:  no joint tenderness and no joint swelling.   Extremities:  no edema, no erythema  Neurologic:  cranial nerves II-XII intact and strength normal in all extremities.   Skin:  color normal and no rashes.   Psych:  not depressed appearing and slightly anxious.     Impression & Recommendations:  Problem # 1:  Preventive Health Care (ICD-V70.0) Overall doing well, age appropriate education and counseling updated, referral for preventive services and immunizations addressed, dietary counseling and smoking status adressed , most recent labs reviewed, ecg reviewed or declined I have personally reviewed and have noted 1.The patient's medical and social history 2.Their use of alcohol, tobacco or illicit drugs 3.Their current medications and supplements  4.The patient's functional ability including ADL's, fall risks, home safety risks and hearing or visual  impairment. 5.Diet and physical activities 6.Evidence for depression or mood disorders The patients weight, height, BMI  have been recorded in the chart I have made referrals, counseling and provided education to the patient based review of the above  I have personally reviewed the Medicare Annual Wellness questionnaire and have noted 1.   The patient's medical and social history 2.   Their use of alcohol, tobacco or illicit drugs 3.   Their current medications and supplements 4.   The patient's functional ability including ADL's, fall risks, home safety risks and hearing or visual             impairment. 5.   Diet and physical activities 6.   Evidence for depression or mood disorders  Problem # 2:  PHARYNGITIS-ACUTE (ICD-462)  Her updated medication list for this problem includes:    Azithromycin 250 Mg Tabs (Azithromycin) .Marland Kitchen... 2po qd for 1 day, then 1po qd for 4days, then stop    Aspir-low 81 Mg Tbec (Aspirin) .Marland Kitchen... 1 by mouth once daily treat as above,  f/u any worsening signs or symptoms   Problem # 3:  HYPERLIPIDEMIA (ICD-272.4)  Her updated medication list for this problem includes:    Lipitor 20 Mg Tabs (Atorvastatin calcium) .Marland Kitchen... 1 by mouth once daily - generic  Labs Reviewed: SGOT: 33 (06/27/2010)   SGPT: 49 (06/27/2010)   HDL:43.50 (06/27/2010), 43.10 (06/21/2009)  LDL:DEL (06/20/2008), 83 (65/78/4696)  Chol:198 (06/27/2010), 249 (06/21/2009)  Trig:396.0 (06/27/2010), 196.0 (06/21/2009) treat as above, f/u any worsening signs or symptoms . Pt to continue diet efforts,  - goal LDL less than 100  Problem # 4:  MENOPAUSAL DISORDER (ICD-627.9) with hot flashes and anxiety - for zoloft 50 once daily   Problem # 5:  IRRITABLE BOWEL SYNDROME (ICD-564.1) with chronic constipation  - pt requests re-start amitiza  Complete Medication List: 1)  Omeprazole 20 Mg Cpdr (Omeprazole) .Marland Kitchen.. 1 by mouth two times a day 2)  Alprazolam 1 Mg Tabs (Alprazolam) .Marland Kitchen.. 1 by mouth two times a day as needed 3)  Lipitor 20 Mg Tabs (Atorvastatin calcium) .Marland Kitchen.. 1 by mouth once daily - generic 4)  Zolpidem Tartrate 10 Mg Tabs (Zolpidem tartrate) .Marland Kitchen.. 1 by mouth at bedtime as needed for sleep 5)  Azithromycin 250 Mg Tabs (Azithromycin) .... 2po qd for 1 day, then 1po qd for 4days, then stop 6)  Aspir-low 81 Mg Tbec (Aspirin) .Marland Kitchen.. 1 by mouth once daily 7)  Sertraline Hcl 50 Mg Tabs (Sertraline hcl) .Marland Kitchen.. 1 by mouth once daily 8)  Amitiza 24 Mcg Caps (Lubiprostone) .Marland Kitchen.. 1 by mouth two times a day  Patient Instructions: 1)  Please take all new medications as prescribed - the generic zoloft (sertraline), amitiza, and z-pack (antibiotic) 2)  Continue all previous medications as before this visit  3)  Please schedule a follow-up appointment in 1 year, or sooner if needed Prescriptions: AMITIZA 24 MCG CAPS (LUBIPROSTONE) 1 by mouth two times a day  #180 x 3   Entered and Authorized by:   Corwin Levins MD   Signed by:   Corwin Levins MD on 07/04/2010   Method used:    Print then Give to Patient   RxID:   [id number] SERTRALINE HCL 50 MG TABS (SERTRALINE HCL) 1 by mouth once daily  #90 x 3   Entered and Authorized by:   Corwin Levins MD   Signed by:   Corwin Levins MD on 07/04/2010   Method used:   Print  then Give to Patient   RxID:   [id number] ZOLPIDEM TARTRATE 10 MG TABS (ZOLPIDEM TARTRATE) 1 by mouth at bedtime as needed for sleep  #90 x 1   Entered and Authorized by:   Corwin Levins MD   Signed by:   Corwin Levins MD on 07/04/2010   Method used:   Print then Give to Patient   RxID:   [id number] LIPITOR 20 MG  TABS (ATORVASTATIN CALCIUM) 1 by mouth once daily - generic  #90 x 3   Entered and Authorized by:   Corwin Levins MD   Signed by:   Corwin Levins MD on 07/04/2010   Method used:   Print then Give to Patient   RxID:   [id number] ALPRAZOLAM 1 MG TABS (ALPRAZOLAM) 1 by mouth two times a day as needed  #60 x 1   Entered and Authorized by:   Corwin Levins MD   Signed by:   Corwin Levins MD on 07/04/2010   Method used:   Print then Give to Patient   RxID:   [id number] OMEPRAZOLE 20 MG CPDR (OMEPRAZOLE) 1 by mouth two times a day  #180 x 3   Entered and Authorized by:   Corwin Levins MD   Signed by:   Corwin Levins MD on 07/04/2010   Method used:   Print then Give to Patient   RxID:   [id number] AZITHROMYCIN 250 MG TABS (AZITHROMYCIN) 2po qd for 1 day, then 1po qd for 4days, then stop  #6 x 1   Entered and Authorized by:   Corwin Levins MD   Signed by:   Corwin Levins MD on 07/04/2010   Method used:   Print then Give to Patient   RxID:   [id number]    Orders Added: 1)  Est. Patient 40-64 years [20254]

## 2010-10-07 NOTE — Progress Notes (Signed)
  Phone Note From Pharmacy  on October 03, 2009 3:19 PM  Refills Requested: Medication #1:  OMEPRAZOLE 20 MG CPDR 1 by mouth two times a day   Dosage confirmed as above?Dosage Confirmed   Notes: [facility] Summary of Call: To Clarify Omeprazole prescription to quantity to #180 Initial call taken by: Scharlene Gloss,  October 03, 2009 3:21 PM    Prescriptions: OMEPRAZOLE 20 MG CPDR (OMEPRAZOLE) 1 by mouth two times a day  #180 x 3   Entered by:   Scharlene Gloss   Authorized by:   Corwin Levins MD   Signed by:   Scharlene Gloss on 10/03/2009   Method used:   Faxed to ...       [facility] Environmental education officer)       [address] [address]       [place], [place]  [address]       Ph: [phone]       Fax: [phone]   RxID:   [id number]   Appended Document:  pharmacy called again to verify quantity. spoke with Rinaldo Cloud and gave above dose and quantity. Ref # [id number] phone # [phone]

## 2010-10-07 NOTE — Progress Notes (Signed)
Summary: Alt  Phone Note From Pharmacy   Caller: [facility] [phone] ref [id number] Summary of Call: pharmacy called stating that pt requested Omeprazole Rx from them. pt also requested a cheaper alt to Lipitor. pharmacy would like to know which med to switch to, please advise Initial call taken by: Margaret Pyle, CMA,  September 24, 2009 1:38 PM  Follow-up for Phone Call        please verify with pt her request Follow-up by: Corwin Levins MD,  September 24, 2009 1:45 PM  Additional Follow-up for Phone Call Additional follow up Details #1::        pt did request Rx through [facility] but does NOT want to change Lipitor, I will send Rx Additional Follow-up by: Margaret Pyle, CMA,  September 24, 2009 2:41 PM    Additional Follow-up for Phone Call Additional follow up Details #2::    that 's what I suspect - appears [facility] is being "[facility]" - ok to refill meds per office protocol - to [facility]  Follow-up by: Corwin Levins MD,  September 24, 2009 2:44 PM  Prescriptions: LIPITOR 20 MG  TABS (ATORVASTATIN CALCIUM) 1 by mouth once daily  #90 x 3   Entered by:   Scharlene Gloss   Authorized by:   Corwin Levins MD   Signed by:   Scharlene Gloss on 09/24/2009   Method used:   Faxed to ...       [facility])             , [place]         Ph: [phone]       Fax: [phone]   RxID:   [id number] OMEPRAZOLE 20 MG CPDR (OMEPRAZOLE) 1 by mouth two times a day  #90 x 3   Entered by:   Scharlene Gloss   Authorized by:   Corwin Levins MD   Signed by:   Scharlene Gloss on 09/24/2009   Method used:   Faxed to ...       [facility])             , [place]         Ph: [phone]       Fax: [phone]   RxID:   [id number]

## 2010-10-07 NOTE — Assessment & Plan Note (Signed)
Summary: BACK PAIN---STC   Vital Signs:  Patient profile:   60 year old female Height:      65 inches Weight:      168.25 pounds BMI:     28.10 O2 Sat:      96 % on Room air Temp:     98.5 degrees F oral Pulse rate:   66 / minute BP sitting:   144 / 84  (left arm) Cuff size:   regular  Vitals Entered By: Zella Ball Ewing CMA Duncan Dull) (May 14, 2010 11:46 AM)  O2 Flow:  Room air CC: Back Pain for 1 week/RE   Primary Care Provider:  Corwin Levins MD  CC:  Back Pain for 1 week/RE.  History of Present Illness: here with constant aching pain to lower back with sharp flares of radiation to the left upper buttock area, now mod ot severe, cant sleep at night, despite quite a bit of ibupren as needed;  no bowel changes, has had some incr urinary freq but no dysuria, urgency or hemauturia.  No LE pain, weakness, or numbness.  Worse with twisting at the wiast , getting into a car, and standing from sitting position. No falls or other injury.  Goes to the gym daily prior to pain starting.  No fever, wt loss, night sweats, loss of appetite or other constitutional symptoms  No prior hx of this pain;  no prior xrays or MRI, no TP or ortho eval in the psat.  no obvious inciting factor. sucha s heavy lifting.    also with a sore spot/bump to the crown of the head for weeks, makes the whole scalp hurt and requests derm eval and tx   Pt denies CP, worsening sob, doe, wheezing, orthopnea, pnd, worsening LE edema, palps, dizziness or syncope  Pt denies new neuro symptoms such as other headache, facial or extremity weakness   Problems Prior to Update: 1)  Skin Lesion  (ICD-709.9) 2)  Back Pain  (ICD-724.5) 3)  Insomnia-sleep Disorder-unspec  (ICD-780.52) 4)  Uti  (ICD-599.0) 5)  Nausea Alone  (ICD-787.02) 6)  Dehydration  (ICD-276.51) 7)  Nausea and Vomiting  (ICD-787.01) 8)  Intermittent Vertigo  (ICD-780.4) 9)  Gastroenteritis  (ICD-558.9) 10)  Osteopenia  (ICD-733.90) 11)  Menopausal Disorder   (ICD-627.9) 12)  Colonic Polyps, Hx of  (ICD-V12.72) 13)  Glucose Intolerance  (ICD-271.3) 14)  Obesity, Unspecified  (ICD-278.00) 15)  Uri  (ICD-465.9) 16)  Preventive Health Care  (ICD-V70.0) 17)  Leg Pain, Right  (ICD-729.5) 18)  Back Pain  (ICD-724.5) 19)  Liver Function Tests, Abnormal  (ICD-794.8) 20)  Myalgia  (ICD-729.1) 21)  Other Tenosynovitis of Hand and Wrist  (ICD-727.05) 22)  Abdominal Pain, Generalized  (ICD-789.07) 23)  Preventive Health Care  (ICD-V70.0) 24)  Family History of Cad Female 1st Degree Relative <60  (ICD-V16.49) 25)  Osteoarthritis, Lumbar Spine  (ICD-721.90) 26)  Low Back Pain  (ICD-724.2) 27)  Breast Biopsy, Hx of  (ICD-V15.9) 28)  Common Migraine  (ICD-346.10) 29)  Depression  (ICD-311) 30)  Hyperlipidemia  (ICD-272.4) 31)  Gerd  (ICD-530.81) 32)  Anxiety  (ICD-300.00) 33)  Irritable Bowel Syndrome  (ICD-564.1)  Medications Prior to Update: 1)  Omeprazole 20 Mg Cpdr (Omeprazole) .Marland Kitchen.. 1 By Mouth Two Times A Day 2)  Alprazolam 1 Mg Tabs (Alprazolam) .Marland Kitchen.. 1 By Mouth Two Times A Day As Needed 3)  Lipitor 20 Mg  Tabs (Atorvastatin Calcium) .Marland Kitchen.. 1 By Mouth Once Daily 4)  Zolpidem Tartrate 10  Mg Tabs (Zolpidem Tartrate) .Marland Kitchen.. 1 By Mouth At Bedtime As Needed For Sleep 5)  Azithromycin 250 Mg Tabs (Azithromycin) .... 2po Qd For 1 Day, Then 1po Qd For 4days, Then Stop  Current Medications (verified): 1)  Omeprazole 20 Mg Cpdr (Omeprazole) .Marland Kitchen.. 1 By Mouth Two Times A Day 2)  Alprazolam 1 Mg Tabs (Alprazolam) .Marland Kitchen.. 1 By Mouth Two Times A Day As Needed 3)  Lipitor 20 Mg  Tabs (Atorvastatin Calcium) .Marland Kitchen.. 1 By Mouth Once Daily 4)  Zolpidem Tartrate 10 Mg Tabs (Zolpidem Tartrate) .Marland Kitchen.. 1 By Mouth At Bedtime As Needed For Sleep 5)  Hydrocodone-Acetaminophen 7.5-325 Mg Tabs (Hydrocodone-Acetaminophen) .Marland Kitchen.. 1po Q 6 Hrs As Needed Pain 6)  Flexeril 5 Mg Tabs (Cyclobenzaprine Hcl) .Marland Kitchen.. 1po Three Times A Day As Needed Pain 7)  Prednisone 10 Mg Tabs (Prednisone) .... 3po  Qd For 3days, Then 2po Qd For 3days, Then 1po Qd For 3days, Then Stop  Allergies (verified): 1)  ! Codeine  Past History:  Past Medical History: Last updated: 07/11/2008 IBS Anxiety GERD chronic constipation Hyperlipidemia Depression migraine Low back pain/LS spine djd Colonic polyps, hx of Osteopenia  Past Surgical History: Last updated: 06/23/2007 left breast biopsy x 2 Hysterectomy Appendectomy  Social History: Last updated: 12/24/2009 Alcohol use-yes - occas to rare Married 2 children quit smoking 04/15/09!!! Drug use-no  Risk Factors: Smoking Status: current (06/27/2008)  Review of Systems       all otherwise negative per pt -  denies polydipsia, polyuria  Physical Exam  General:  alert and overweight-appearing but more toned it seems with more recent excercise Head:  normocephalic and atraumatic.   Eyes:  vision grossly intact, pupils equal, and pupils round.   Ears:  R ear normal and L ear normal.   Nose:  no external deformity and no nasal discharge.   Mouth:  no gingival abnormalities and pharynx pink and moist.   Neck:  supple and no masses.   Lungs:  normal respiratory effort and normal breath sounds.   Heart:  normal rate and regular rhythm.   Abdomen:  soft, non-tender, and normal bowel sounds.   Msk:  no spine tender, and no paravertebral tender except for left lower lumbar area mild to mod spasm noted;  also more "pinpoint" localized tender noted lower lumbar paravertebral area that reproduces her pain as well  Extremities:  no edema, no erythema  Neurologic:  cranial nerves II-XII intact, strength normal in all extremities, sensation intact to light touch, and gait normal.   Skin:  color normal and no rashes.  , did have one lesion fleshy pink/red and slight raised , mild tender approx 8 mm in size, non ulcerated to crown area Psych:  not depressed appearing and slightly anxious.     Impression & Recommendations:  Problem # 1:  BACK PAIN  (ICD-724.5)  Her updated medication list for this problem includes:    Hydrocodone-acetaminophen 7.5-325 Mg Tabs (Hydrocodone-acetaminophen) .Marland Kitchen... 1po q 6 hrs as needed pain    Flexeril 5 Mg Tabs (Cyclobenzaprine hcl) .Marland Kitchen... 1po three times a day as needed pain exam at least in part c/w muscle spasm, and possibly some exacerbation of underlying lumber DJD or DDD;  treat as above, f/u any worsening signs or symptoms  , as well as pred pack for home  Problem # 2:  SKIN LESION (ICD-709.9)  ? localize dermatitis - for derm referral   Orders: Dermatology Referral (Derma)  Problem # 3:  GLUCOSE INTOLERANCE (ICD-271.3) asympt - pt  to call for onset polys or cbg > 200  Complete Medication List: 1)  Omeprazole 20 Mg Cpdr (Omeprazole) .Marland Kitchen.. 1 by mouth two times a day 2)  Alprazolam 1 Mg Tabs (Alprazolam) .Marland Kitchen.. 1 by mouth two times a day as needed 3)  Lipitor 20 Mg Tabs (Atorvastatin calcium) .Marland Kitchen.. 1 by mouth once daily 4)  Zolpidem Tartrate 10 Mg Tabs (Zolpidem tartrate) .Marland Kitchen.. 1 by mouth at bedtime as needed for sleep 5)  Hydrocodone-acetaminophen 7.5-325 Mg Tabs (Hydrocodone-acetaminophen) .Marland Kitchen.. 1po q 6 hrs as needed pain 6)  Flexeril 5 Mg Tabs (Cyclobenzaprine hcl) .Marland Kitchen.. 1po three times a day as needed pain 7)  Prednisone 10 Mg Tabs (Prednisone) .... 3po qd for 3days, then 2po qd for 3days, then 1po qd for 3days, then stop  Patient Instructions: 1)  Please take all new medications as prescribed 2)  Continue all previous medications as before this visit  3)  You will be contacted about the referral(s) to: dermatology 4)  Please schedule a follow-up appointment as needed. Prescriptions: PREDNISONE 10 MG TABS (PREDNISONE) 3po qd for 3days, then 2po qd for 3days, then 1po qd for 3days, then stop  #18 x 0   Entered and Authorized by:   Corwin Levins MD   Signed by:   Corwin Levins MD on 05/14/2010   Method used:   Print then Give to Patient   RxID:   [id number] FLEXERIL 5 MG TABS  (CYCLOBENZAPRINE HCL) 1po three times a day as needed pain  #60 x 1   Entered and Authorized by:   Corwin Levins MD   Signed by:   Corwin Levins MD on 05/14/2010   Method used:   Print then Give to Patient   RxID:   [id number] HYDROCODONE-ACETAMINOPHEN 7.5-325 MG TABS (HYDROCODONE-ACETAMINOPHEN) 1po q 6 hrs as needed pain  #60 x 1   Entered and Authorized by:   Corwin Levins MD   Signed by:   Corwin Levins MD on 05/14/2010   Method used:   Print then Give to Patient   RxID:   [id number]

## 2010-10-07 NOTE — Progress Notes (Signed)
Summary: Call Report  Phone Note Other Incoming   Caller: [facility] Call Report Summary of Call: Triage Call Report Triage Record Num: [id number] Operator: Revonda Humphrey Patient Name: Molly Lewis Call Date & Time: 12/21/2009 6:10:47PM Patient Phone: [phone] PCP: [place] [facility] Patient Gender: Female PCP Fax : [phone] Patient DOB: July 30, 1951 Practice Name: [facility] Reason for Call: Patient calling about abd pain that started over bladder yesterday am 12/20/09. Had frequency, abd became distended, constant pressure and discomfort below umbilicus in center. Went to [facility] today, had labs drawn, urinalysis and test for H.Pylori after telling them she had this with similar Sx about 1 yr ago and became very ill...told her urine did not show infection and H. Pylori was positive but was told "they could not treat her. Told her to follow up w/PMD on Monday". Says constant discomfort, pressure over area at 5-6/10 level today, is distended and getting worse all the time. Wanting antibiotic called in to start Tx...doesn't think she can wait until Monday since she is afraid of becoming very ill like last time. Spoke with Dr Tawanna Cooler about Sx and test results....wants patient to go to ER for evaluation. Brian/husband notified, agrees to take her to ER. Protocol(s) Used: Abdominal Pain / Discomfort Recommended Outcome per Protocol: See Provider within 24 hours Reason for Outcome: Pain/discomfort over bladder AND urinary tract symptoms New onset constant mild or aching lower abdominal pain Care Advice:  ~ 12/21/2009 6:48:35PM Page 1 of 1 [email or web address] Initial call taken by: Margaret Pyle, CMA,  December 23, 2009 9:06 AM  Follow-up for Phone Call        why was this sent to me? Follow-up by: Etta Grandchild MD,  December 23, 2009 9:20 AM  Additional Follow-up for Phone Call Additional follow up Details #1::        Call report said PC Dr. Yetta Barre. sent to Dr. Jonny Ruiz Additional  Follow-up by: Margaret Pyle, CMA,  December 23, 2009 11:18 AM    Additional Follow-up for Phone Call Additional follow up Details #2::    the antibody test for H pylori can remain positive for years even though the patient has been treated (as she was in oct 2010);  we can refer to GI if she would like to determine by other means if she has recurrent or persistent infection;  there is no indication for acute antibx at this time based on this hx  o/w , this hx is most c/w a flare of her chronic constipation, for which she has been tx with amitiza in the past.  I can refer to GI if this is not working as well, to look into alternative diagnoses or treatment;  please let me know Follow-up by: Corwin Levins MD,  December 23, 2009 12:52 PM  Additional Follow-up for Phone Call Additional follow up Details #3:: Details for Additional Follow-up Action Taken: left message on machine for pt to return my call. Margaret Pyle, CMA  December 23, 2009 3:21 PM  pt has appt scheduled with JWJ today. Margaret Pyle, CMA  December 24, 2009 10:49 AM

## 2010-10-07 NOTE — Assessment & Plan Note (Signed)
Summary: EAR ACHE  STC   Vital Signs:  Patient profile:   60 year old female Height:      64 inches Weight:      164 pounds BMI:     28.25 O2 Sat:      97 % on Room air Temp:     97.4 degrees F oral Pulse rate:   70 / minute BP sitting:   124 / 72  (left arm) Cuff size:   regular  Vitals Entered By: Bill Salinas CMA (March 14, 2010 4:20 PM)  O2 Flow:  Room air CC: pt here with c/o of ears aching and sore throat/ab Comments Pt states she is no longer taking Amitiza or sertraline   Primary Care Provider:  Corwin Levins MD  CC:  pt here with c/o of ears aching and sore throat/ab.  History of Present Illness: here with acute visit, unfort gained 20 lbs in the past 2 mo and is quite frustrated and she states she is quite dilitgent with attention to diet and excerciase ; quit smoking aug 2010;  today c/o marked ST, fever , headache, general weakness and malaise but no sinus congestion, cough and Pt denies CP, sob, doe, wheezing, orthopnea, pnd, worsening LE edema, palps, dizziness or syncope   Chronic constipation does seem much improved as of late wtih increased excercise and daily fiber bar - was able to stop the amitiza.  Also overall stress level and sleep at night improved as well.  Denies worsening depressive symptoms, suicidal ideation or panic.  Not currently taking the sertraline as well - did not start.    Problems Prior to Update: 1)  Pharyngitis-acute  (ICD-462) 2)  Insomnia-sleep Disorder-unspec  (ICD-780.52) 3)  Uti  (ICD-599.0) 4)  Nausea Alone  (ICD-787.02) 5)  Dehydration  (ICD-276.51) 6)  Nausea and Vomiting  (ICD-787.01) 7)  Intermittent Vertigo  (ICD-780.4) 8)  Gastroenteritis  (ICD-558.9) 9)  Osteopenia  (ICD-733.90) 10)  Menopausal Disorder  (ICD-627.9) 11)  Colonic Polyps, Hx of  (ICD-V12.72) 12)  Glucose Intolerance  (ICD-271.3) 13)  Obesity, Unspecified  (ICD-278.00) 14)  Uri  (ICD-465.9) 15)  Preventive Health Care  (ICD-V70.0) 16)  Leg Pain, Right   (ICD-729.5) 17)  Back Pain  (ICD-724.5) 18)  Liver Function Tests, Abnormal  (ICD-794.8) 19)  Myalgia  (ICD-729.1) 20)  Other Tenosynovitis of Hand and Wrist  (ICD-727.05) 21)  Abdominal Pain, Generalized  (ICD-789.07) 22)  Preventive Health Care  (ICD-V70.0) 23)  Family History of Cad Female 1st Degree Relative <60  (ICD-V16.49) 24)  Osteoarthritis, Lumbar Spine  (ICD-721.90) 25)  Low Back Pain  (ICD-724.2) 26)  Breast Biopsy, Hx of  (ICD-V15.9) 27)  Common Migraine  (ICD-346.10) 28)  Depression  (ICD-311) 29)  Hyperlipidemia  (ICD-272.4) 30)  Gerd  (ICD-530.81) 31)  Anxiety  (ICD-300.00) 32)  Irritable Bowel Syndrome  (ICD-564.1)  Medications Prior to Update: 1)  Omeprazole 20 Mg Cpdr (Omeprazole) .Marland Kitchen.. 1 By Mouth Two Times A Day 2)  Alprazolam 1 Mg Tabs (Alprazolam) .Marland Kitchen.. 1 By Mouth Two Times A Day As Needed 3)  Lipitor 20 Mg  Tabs (Atorvastatin Calcium) .Marland Kitchen.. 1 By Mouth Once Daily 4)  Amitiza 24 Mcg Caps (Lubiprostone) .Marland Kitchen.. 1po Two Times A Day 5)  Zolpidem Tartrate 10 Mg Tabs (Zolpidem Tartrate) .Marland Kitchen.. 1 By Mouth At Bedtime As Needed For Sleep 6)  Sertraline Hcl 50 Mg Tabs (Sertraline Hcl) .Marland Kitchen.. 1p O Once Daily  Current Medications (verified): 1)  Omeprazole 20 Mg Cpdr (  Omeprazole) .Marland Kitchen.. 1 By Mouth Two Times A Day 2)  Alprazolam 1 Mg Tabs (Alprazolam) .Marland Kitchen.. 1 By Mouth Two Times A Day As Needed 3)  Lipitor 20 Mg  Tabs (Atorvastatin Calcium) .Marland Kitchen.. 1 By Mouth Once Daily 4)  Zolpidem Tartrate 10 Mg Tabs (Zolpidem Tartrate) .Marland Kitchen.. 1 By Mouth At Bedtime As Needed For Sleep 5)  Azithromycin 250 Mg Tabs (Azithromycin) .... 2po Qd For 1 Day, Then 1po Qd For 4days, Then Stop  Allergies (verified): 1)  ! Codeine  Past History:  Past Medical History: Last updated: 07/11/2008 IBS Anxiety GERD chronic constipation Hyperlipidemia Depression migraine Low back pain/LS spine djd Colonic polyps, hx of Osteopenia  Past Surgical History: Last updated: 06/23/2007 left breast biopsy x  2 Hysterectomy Appendectomy  Social History: Last updated: 12/24/2009 Alcohol use-yes - occas to rare Married 2 children quit smoking 04/15/09!!! Drug use-no  Risk Factors: Smoking Status: current (06/27/2008)  Review of Systems       all otherwise negative per pt -    Physical Exam  General:  alert and overweight-appearing but mild.  , and mild ill  Head:  normocephalic and atraumatic.   Eyes:  vision grossly intact, pupils equal, and pupils round.   Ears:  bilat tm's mild erythema, sinus nontender Nose:  nasal dischargemucosal pallor and mucosal edema.   Mouth:  pharyngeal erythema and pharyngeal exudate.   Neck:  supple and cervical lymphadenopathy.   Lungs:  normal respiratory effort and normal breath sounds.   Heart:  normal rate and regular rhythm.   Abdomen:  soft, non-tender, and normal bowel sounds.   Extremities:  no edema, no erythema  Psych:  not depressed appearing and moderately anxious.     Impression & Recommendations:  Problem # 1:  PHARYNGITIS-ACUTE (ICD-462)  Her updated medication list for this problem includes:    Azithromycin 250 Mg Tabs (Azithromycin) .Marland Kitchen... 2po qd for 1 day, then 1po qd for 4days, then stop treat as above, f/u any worsening signs or symptoms   Problem # 2:  IRRITABLE BOWEL SYNDROME (ICD-564.1) constipation predom  - improved, ok to d/c amitiza  Problem # 3:  INSOMNIA-SLEEP DISORDER-UNSPEC (ICD-780.52)  Her updated medication list for this problem includes:    Zolpidem Tartrate 10 Mg Tabs (Zolpidem tartrate) .Marland Kitchen... 1 by mouth at bedtime as needed for sleep treat as above, f/u any worsening signs or symptoms , improved  Problem # 4:  DEPRESSION (ICD-311)  The following medications were removed from the medication list:    Sertraline Hcl 50 Mg Tabs (Sertraline hcl) .Marland Kitchen... 1p o once daily Her updated medication list for this problem includes:    Alprazolam 1 Mg Tabs (Alprazolam) .Marland Kitchen... 1 by mouth two times a day as needed stable  overall by hx and exam, ok to continue meds/tx as is   Complete Medication List: 1)  Omeprazole 20 Mg Cpdr (Omeprazole) .Marland Kitchen.. 1 by mouth two times a day 2)  Alprazolam 1 Mg Tabs (Alprazolam) .Marland Kitchen.. 1 by mouth two times a day as needed 3)  Lipitor 20 Mg Tabs (Atorvastatin calcium) .Marland Kitchen.. 1 by mouth once daily 4)  Zolpidem Tartrate 10 Mg Tabs (Zolpidem tartrate) .Marland Kitchen.. 1 by mouth at bedtime as needed for sleep 5)  Azithromycin 250 Mg Tabs (Azithromycin) .... 2po qd for 1 day, then 1po qd for 4days, then stop  Patient Instructions: 1)  stop the amitiza and sertraline as you have 2)  Please take all new medications as prescribed - the antibiotic 3)  Continue  all previous medications as before this visit 4)  Please schedule a follow-up appointment in 3 months with CPX labs Prescriptions: AZITHROMYCIN 250 MG TABS (AZITHROMYCIN) 2po qd for 1 day, then 1po qd for 4days, then stop  #6 x 1   Entered and Authorized by:   Corwin Levins MD   Signed by:   Corwin Levins MD on 03/14/2010   Method used:   Print then Give to Patient   RxID:   [id number]

## 2010-10-07 NOTE — Miscellaneous (Signed)
Summary: Orders Update  Clinical Lists Changes  Orders: Added new Service order of EKG w/ Interpretation (93000) - Signed 

## 2010-10-09 NOTE — Assessment & Plan Note (Signed)
Summary: HEADACHES--VOMITING  STC   Vital Signs:  Patient profile:   60 year old female Height:      64 inches Weight:      169.50 pounds BMI:     29.20 O2 Sat:      99 % on Room air Temp:     98 degrees F oral Pulse rate:   73 / minute BP sitting:   140 / 82  (left arm) Cuff size:   regular  Vitals Entered By: Zella Ball Ewing CMA (AAMA) (September 10, 2010 9:27 AM)  O2 Flow:  Room air CC: Headaches and nausea for 1 week/RE   Primary Care Provider:  Corwin Levins MD  CC:  Headaches and nausea for 1 week/RE.  History of Present Illness: here with acute visit - c/o 1 wk mild to mod pain to the mid forehead and nasal area with tenderness , throbbing and nausea and mild swelling but without blurred vision, other headache or sinus pain, pressure ;  does have some mild nasal congesiton but not sure of color, no blood though.  Some slight ST and non prod cough as well;  all some better with OTC decongestant for a few hrs but then returns,  has also felt some warm and feverish with general weakness and malaise. Pt denies CP, worsening sob, doe, wheezing, orthopnea, pnd, worsening LE edema, palps, dizziness or syncope  Pt denies new neuro symptoms such as  facial or extremity weakness  Pt denies polydipsia, polyuria.  Overall good compliance with meds, trying to follow low chol diet, wt stable, little excercise however .  She is very worried about every Headache these days as her Mother and Anette Guarneri both died related to brain tumors in their 52's, though she was told the aggressive tumors they had were not hereditary.  No recent wt loss, night sweats, loss of appetite or other constitutional symptoms Denies worsening depressive symptoms, suicidal ideation, or panic. , though has some ongoing anxiety and ongoing social stressors.    Problems Prior to Update: 1)  Headache  (ICD-784.0) 2)  Skin Lesion  (ICD-709.9) 3)  Back Pain  (ICD-724.5) 4)  Insomnia-sleep Disorder-unspec  (ICD-780.52) 5)  Uti   (ICD-599.0) 6)  Nausea Alone  (ICD-787.02) 7)  Dehydration  (ICD-276.51) 8)  Nausea and Vomiting  (ICD-787.01) 9)  Intermittent Vertigo  (ICD-780.4) 10)  Gastroenteritis  (ICD-558.9) 11)  Osteopenia  (ICD-733.90) 12)  Menopausal Disorder  (ICD-627.9) 13)  Colonic Polyps, Hx of  (ICD-V12.72) 14)  Glucose Intolerance  (ICD-271.3) 15)  Obesity, Unspecified  (ICD-278.00) 16)  Uri  (ICD-465.9) 17)  Preventive Health Care  (ICD-V70.0) 18)  Leg Pain, Right  (ICD-729.5) 19)  Back Pain  (ICD-724.5) 20)  Liver Function Tests, Abnormal  (ICD-794.8) 21)  Myalgia  (ICD-729.1) 22)  Other Tenosynovitis of Hand and Wrist  (ICD-727.05) 23)  Abdominal Pain, Generalized  (ICD-789.07) 24)  Preventive Health Care  (ICD-V70.0) 25)  Family History of Cad Female 1st Degree Relative <60  (ICD-V16.49) 26)  Osteoarthritis, Lumbar Spine  (ICD-721.90) 27)  Low Back Pain  (ICD-724.2) 28)  Breast Biopsy, Hx of  (ICD-V15.9) 29)  Common Migraine  (ICD-346.10) 30)  Depression  (ICD-311) 31)  Hyperlipidemia  (ICD-272.4) 32)  Gerd  (ICD-530.81) 33)  Anxiety  (ICD-300.00) 34)  Irritable Bowel Syndrome  (ICD-564.1)  Medications Prior to Update: 1)  Omeprazole 20 Mg Cpdr (Omeprazole) .Marland Kitchen.. 1 By Mouth Two Times A Day 2)  Alprazolam 1 Mg Tabs (Alprazolam) .Marland Kitchen.. 1 By  Mouth Two Times A Day As Needed 3)  Lipitor 20 Mg  Tabs (Atorvastatin Calcium) .Marland Kitchen.. 1 By Mouth Once Daily - Generic 4)  Zolpidem Tartrate 10 Mg Tabs (Zolpidem Tartrate) .Marland Kitchen.. 1 By Mouth At Bedtime As Needed For Sleep 5)  Azithromycin 250 Mg Tabs (Azithromycin) .... 2po Qd For 1 Day, Then 1po Qd For 4days, Then Stop 6)  Aspir-Low 81 Mg Tbec (Aspirin) .Marland Kitchen.. 1 By Mouth Once Daily 7)  Sertraline Hcl 50 Mg Tabs (Sertraline Hcl) .Marland Kitchen.. 1 By Mouth Once Daily 8)  Amitiza 24 Mcg Caps (Lubiprostone) .Marland Kitchen.. 1 By Mouth Two Times A Day  Current Medications (verified): 1)  Omeprazole 20 Mg Cpdr (Omeprazole) .Marland Kitchen.. 1 By Mouth Two Times A Day 2)  Alprazolam 1 Mg Tabs  (Alprazolam) .Marland Kitchen.. 1 By Mouth Two Times A Day As Needed 3)  Lipitor 20 Mg  Tabs (Atorvastatin Calcium) .Marland Kitchen.. 1 By Mouth Once Daily - Generic 4)  Zolpidem Tartrate 10 Mg Tabs (Zolpidem Tartrate) .Marland Kitchen.. 1 By Mouth At Bedtime As Needed For Sleep 5)  Aspir-Low 81 Mg Tbec (Aspirin) .Marland Kitchen.. 1 By Mouth Once Daily 6)  Sertraline Hcl 50 Mg Tabs (Sertraline Hcl) .Marland Kitchen.. 1 By Mouth Once Daily 7)  Amitiza 24 Mcg Caps (Lubiprostone) .Marland Kitchen.. 1 By Mouth Two Times A Day 8)  Calcium 1200 1200-1000 Mg-Unit Chew (Calcium Carbonate-Vit D-Min) .Marland Kitchen.. 1 By Mouth Once Daily 9)  Levofloxacin 500 Mg Tabs (Levofloxacin) .Marland Kitchen.. 1po Two Times A Day 10)  Sudatex G 40-400 Mg Tabs (Pseudoephedrine-Guaifenesin) .Marland Kitchen.. 1 By Mouth Two Times A Day As Needed 11)  Sumatriptan Succinate 100 Mg Tabs (Sumatriptan Succinate) .Marland Kitchen.. 1po Every Other Day As Needed 12)  Promethazine Hcl 25 Mg Tabs (Promethazine Hcl) .Marland Kitchen.. 1po Q 6 Hrs As Needed Nausea  Allergies (verified): 1)  ! Codeine  Past History:  Past Medical History: Last updated: 07/11/2008 IBS Anxiety GERD chronic constipation Hyperlipidemia Depression migraine Low back pain/LS spine djd Colonic polyps, hx of Osteopenia  Past Surgical History: Last updated: 06/23/2007 left breast biopsy x 2 Hysterectomy Appendectomy  Social History: Last updated: 12/24/2009 Alcohol use-yes - occas to rare Married 2 children quit smoking 04/15/09!!! Drug use-no  Risk Factors: Smoking Status: current (06/27/2008)  Review of Systems       all otherwise negative per pt -    Physical Exam  General:  alert and overweight-appearing., mild ill  Head:  normocephalic and atraumatic.   Eyes:  vision grossly intact, pupils equal, and pupils round.   Ears:  bilat tm's mild red, sinus tender bilat Nose:  nasal dischargemucosal pallor and mucosal edema.   Mouth:  pharyngeal erythema and fair dentition.   Neck:  supple and no masses.   Lungs:  normal respiratory effort and normal breath sounds.     Heart:  normal rate and regular rhythm.   Abdomen:  soft, non-tender, and normal bowel sounds.   Extremities:  no edema, no erythema  Neurologic:  cranial nerves II-XII intact, strength normal in all extremities, and gait normal.     Impression & Recommendations:  Problem # 1:  HEADACHE (ICD-784.0)  Her updated medication list for this problem includes:    Aspir-low 81 Mg Tbec (Aspirin) .Marland Kitchen... 1 by mouth once daily    Sumatriptan Succinate 100 Mg Tabs (Sumatriptan succinate) .Marland Kitchen... 1po every other day as needed suspect low grade sinusitis, vs allergy related vs migraine, doubt CNS mass for which she is most concerned but cannot compeltly r/o this as well;  will tx with course  antibx and sudahist asd,  as well as rx for sumatriptan if needed, but consider [facility] imgaging, or even [facility] if not responding to tx  Problem # 2:  COMMON MIGRAINE (ICD-346.10)  Her updated medication list for this problem includes:    Aspir-low 81 Mg Tbec (Aspirin) .Marland Kitchen... 1 by mouth once daily    Sumatriptan Succinate 100 Mg Tabs (Sumatriptan succinate) .Marland Kitchen... 1po every other day as needed treat as above, f/u any worsening signs or symptoms  - stable overall by hx and exam, ok to continue meds/tx as is   Problem # 3:  DEPRESSION (ICD-311)  Her updated medication list for this problem includes:    Alprazolam 1 Mg Tabs (Alprazolam) .Marland Kitchen... 1 by mouth two times a day as needed    Sertraline Hcl 50 Mg Tabs (Sertraline hcl) .Marland Kitchen... 1 by mouth once daily stable overall by hx and exam, ok to continue meds/tx as is, declines change in med need  Problem # 4:  ANXIETY (ICD-300.00)  Her updated medication list for this problem includes:    Alprazolam 1 Mg Tabs (Alprazolam) .Marland Kitchen... 1 by mouth two times a day as needed    Sertraline Hcl 50 Mg Tabs (Sertraline hcl) .Marland Kitchen... 1 by mouth once daily stable overall by hx and exam, ok to continue meds/tx as is , declines need for counseling  Complete Medication List: 1)   Omeprazole 20 Mg Cpdr (Omeprazole) .Marland Kitchen.. 1 by mouth two times a day 2)  Alprazolam 1 Mg Tabs (Alprazolam) .Marland Kitchen.. 1 by mouth two times a day as needed 3)  Lipitor 20 Mg Tabs (Atorvastatin calcium) .Marland Kitchen.. 1 by mouth once daily - generic 4)  Zolpidem Tartrate 10 Mg Tabs (Zolpidem tartrate) .Marland Kitchen.. 1 by mouth at bedtime as needed for sleep 5)  Aspir-low 81 Mg Tbec (Aspirin) .Marland Kitchen.. 1 by mouth once daily 6)  Sertraline Hcl 50 Mg Tabs (Sertraline hcl) .Marland Kitchen.. 1 by mouth once daily 7)  Amitiza 24 Mcg Caps (Lubiprostone) .Marland Kitchen.. 1 by mouth two times a day 8)  Calcium 1200 1200-1000 Mg-unit Chew (Calcium carbonate-vit d-min) .Marland Kitchen.. 1 by mouth once daily 9)  Levofloxacin 500 Mg Tabs (Levofloxacin) .Marland Kitchen.. 1po two times a day 10)  Sudatex G 40-400 Mg Tabs (Pseudoephedrine-guaifenesin) .Marland Kitchen.. 1 by mouth two times a day as needed 11)  Sumatriptan Succinate 100 Mg Tabs (Sumatriptan succinate) .Marland Kitchen.. 1po every other day as needed 12)  Promethazine Hcl 25 Mg Tabs (Promethazine hcl) .Marland Kitchen.. 1po q 6 hrs as needed nausea  Other Orders: Flu Vaccine 35yrs + (40981) Admin 1st Vaccine (19147)  Patient Instructions: 1)  You had the flu shot today 2)  Please take all new medications as prescribed 3)  Continue all previous medications as before this visit  4)  Please schedule a follow-up appointment in oct 2012 for CPX with labs , or sooner if needed Prescriptions: PROMETHAZINE HCL 25 MG TABS (PROMETHAZINE HCL) 1po q 6 hrs as needed nausea  #40 x 1   Entered and Authorized by:   Corwin Levins MD   Signed by:   Corwin Levins MD on 09/10/2010   Method used:   Print then Give to Patient   RxID:   [id number] SUMATRIPTAN SUCCINATE 100 MG TABS (SUMATRIPTAN SUCCINATE) 1po every other day as needed  #9 x 5   Entered and Authorized by:   Corwin Levins MD   Signed by:   Corwin Levins MD on 09/10/2010   Method used:   Print then Give  to Patient   RxID:   [id number] SUDATEX G 40-400 MG TABS (PSEUDOEPHEDRINE-GUAIFENESIN) 1 by mouth two times a  day as needed  #30 x 1   Entered and Authorized by:   Corwin Levins MD   Signed by:   Corwin Levins MD on 09/10/2010   Method used:   Print then Give to Patient   RxID:   [id number] LEVOFLOXACIN 500 MG TABS (LEVOFLOXACIN) 1po two times a day  #10 x 0   Entered and Authorized by:   Corwin Levins MD   Signed by:   Corwin Levins MD on 09/10/2010   Method used:   Print then Give to Patient   RxID:   [id number]    Orders Added: 1)  Flu Vaccine 5yrs + [53664] 2)  Admin 1st Vaccine [90471] 3)  Est. Patient Level IV [40347]   Immunizations Administered:  Influenza Vaccine # 1:    Vaccine Type: Fluvax 3+    Site: left deltoid    Mfr: Sanofi Pasteur    Dose: 0.5 ml    Route: IM    Given by: Zella Ball Ewing CMA (AAMA)    Exp. Date: 03/07/2011    Lot #: [id number]    VIS given: 04/01/10 version given September 10, 2010.   Immunizations Administered:  Influenza Vaccine # 1:    Vaccine Type: Fluvax 3+    Site: left deltoid    Mfr: Sanofi Pasteur    Dose: 0.5 ml    Route: IM    Given by: Zella Ball Ewing CMA (AAMA)    Exp. Date: 03/07/2011    Lot #: [id number]    VIS given: 04/01/10 version given September 10, 2010.

## 2010-12-12 LAB — DIFFERENTIAL
Basophils Absolute: 0 10*3/uL (ref 0.0–0.1)
Basophils Relative: 0 % (ref 0–1)
Monocytes Absolute: 0.3 10*3/uL (ref 0.1–1.0)
Neutro Abs: 3.7 10*3/uL (ref 1.7–7.7)

## 2010-12-12 LAB — URINE MICROSCOPIC-ADD ON

## 2010-12-12 LAB — URINALYSIS, ROUTINE W REFLEX MICROSCOPIC
Glucose, UA: NEGATIVE mg/dL
Leukocytes, UA: NEGATIVE
pH: 7.5 (ref 5.0–8.0)

## 2010-12-12 LAB — URINE CULTURE

## 2010-12-12 LAB — COMPREHENSIVE METABOLIC PANEL
AST: 26 U/L (ref 0–37)
Albumin: 3.9 g/dL (ref 3.5–5.2)
Calcium: 9.8 mg/dL (ref 8.4–10.5)
Chloride: 104 mEq/L (ref 96–112)
Creatinine, Ser: 0.88 mg/dL (ref 0.4–1.2)
Total Bilirubin: 0.6 mg/dL (ref 0.3–1.2)

## 2010-12-12 LAB — CBC
Hemoglobin: 13.5 g/dL (ref 12.0–15.0)
MCHC: 35 g/dL (ref 30.0–36.0)
Platelets: 114 10*3/uL — ABNORMAL LOW (ref 150–400)
RDW: 13.3 % (ref 11.5–15.5)

## 2011-02-16 ENCOUNTER — Encounter: Payer: Self-pay | Admitting: Internal Medicine

## 2011-02-16 ENCOUNTER — Ambulatory Visit (INDEPENDENT_AMBULATORY_CARE_PROVIDER_SITE_OTHER): Payer: BC Managed Care – PPO | Admitting: Internal Medicine

## 2011-02-16 VITALS — BP 134/82 | HR 84 | Temp 98.6°F | Ht 64.0 in | Wt 157.2 lb

## 2011-02-16 DIAGNOSIS — M79605 Pain in left leg: Secondary | ICD-10-CM

## 2011-02-16 DIAGNOSIS — M79604 Pain in right leg: Secondary | ICD-10-CM | POA: Insufficient documentation

## 2011-02-16 DIAGNOSIS — Z0000 Encounter for general adult medical examination without abnormal findings: Secondary | ICD-10-CM

## 2011-02-16 DIAGNOSIS — J019 Acute sinusitis, unspecified: Secondary | ICD-10-CM

## 2011-02-16 DIAGNOSIS — E119 Type 2 diabetes mellitus without complications: Secondary | ICD-10-CM | POA: Insufficient documentation

## 2011-02-16 DIAGNOSIS — M79609 Pain in unspecified limb: Secondary | ICD-10-CM

## 2011-02-16 DIAGNOSIS — R7302 Impaired glucose tolerance (oral): Secondary | ICD-10-CM

## 2011-02-16 HISTORY — DX: Impaired glucose tolerance (oral): R73.02

## 2011-02-16 MED ORDER — LEVOFLOXACIN 250 MG PO TABS: 250.0000 mg | ORAL_TABLET | Freq: Every day | ORAL | Status: AC

## 2011-02-16 NOTE — Assessment & Plan Note (Signed)
Mild to mod, for antibx course,  to f/u any worsening symptoms or concerns 

## 2011-02-16 NOTE — Assessment & Plan Note (Addendum)
Former smoker (quit x 2 yrs) , does regular exercise 5x/wk, but with unsual discomfort ? Worse with exercise, for LE art dopplers; if not signficant, may consider N[facility]/EM[facility]

## 2011-02-16 NOTE — Patient Instructions (Addendum)
Take all new medications as prescribed Continue all other medications as before You will be contacted regarding the referral for: leg arterial dopplers Please return in 3 mo with Lab testing done 3-5 days before

## 2011-02-16 NOTE — Progress Notes (Signed)
  Subjective:    Patient ID: Molly Lewis, female    DOB: Sep 05, 1951, 60 y.o.   MRN: [id number]  HPI  Here with 3 days acute onset fever, facial pain, pressure, general weakness and malaise, and greenish d/c, with slight ST, but little to no cough and Pt denies chest pain, increased sob or doe, wheezing, orthopnea, PND, increased LE swelling, palpitations, dizziness or syncope.  Pt denies new neurological symptoms such as new headache, or facial or extremity weakness or numbness   Pt denies polydipsia, polyuria  Pt states overall good compliance with meds, trying to follow lower cholesterol  diet, wt overall down over 10 lbs with more exercise.   Does also have 3 episodes of LE pain, coolness to touch intermittent over the past weeks, ? Increased with activity.  Quit smoking x 2 yrs.   Pt denies fever, wt loss, night sweats, loss of appetite, or other constitutional symptoms  Overall good compliance with treatment, and good medicine tolerability.   No back pain. Past Medical History  Diagnosis Date  . Impaired glucose tolerance 02/16/2011  . ANXIETY 06/23/2007  . DEPRESSION 06/23/2007  . GERD 06/23/2007  . HYPERLIPIDEMIA 06/23/2007  . BREAST BIOPSY, HX OF 06/23/2007  . COLONIC POLYPS, HX OF 06/27/2008  . COMMON MIGRAINE 06/23/2007  . INSOMNIA-SLEEP DISORDER-UNSPEC 06/26/2009  . Irritable bowel syndrome 06/23/2007  . OSTEOARTHRITIS, LUMBAR SPINE 06/23/2007  . OSTEOPENIA 07/11/2008   No past surgical history on file.  reports that she has quit smoking. She does not have any smokeless tobacco history on file. She reports that she drinks alcohol. She reports that she does not use illicit drugs. family history is not on file. Allergies  Allergen Reactions  . Codeine     REACTION: violentl nausea and vomiting   No current outpatient prescriptions on file prior to visit.      Review of Systems All otherwise neg per pt     Objective:   Physical Exam BP 134/82  Pulse 84  Temp(Src) 98.6  F (37 C) (Oral)  Ht 5\' 4"  (1.626 m)  Wt 157 lb 4 oz (71.328 kg)  BMI 26.99 kg/m2  SpO2 96% Physical Exam  VS noted, mild ill Constitutional: Pt appears well-developed and well-nourished.  HENT: Head: Normocephalic.  Right Ear: External ear normal.  Left Ear: External ear normal.  Bilat tm's mild erythema.  Sinus tender left > right.  Pharynx mild erythema Eyes: Conjunctivae and EOM are normal. Pupils are equal, round, and reactive to light.  Neck: Normal range of motion. Neck supple.  Cardiovascular: Normal rate and regular rhythm.   Pulmonary/Chest: Effort normal and breath sounds normal.  Neurological: Pt is alert. No cranial nerve deficit.  Skin: Skin is warm. No erythema.  Psychiatric: Pt behavior is normal. Thought content normal.  LE dorsalis pedis trace to 1+ bilat       Assessment & Plan:

## 2011-02-19 ENCOUNTER — Other Ambulatory Visit: Payer: Self-pay | Admitting: Internal Medicine

## 2011-02-19 DIAGNOSIS — I739 Peripheral vascular disease, unspecified: Secondary | ICD-10-CM

## 2011-02-19 DIAGNOSIS — M79609 Pain in unspecified limb: Secondary | ICD-10-CM

## 2011-02-23 ENCOUNTER — Encounter: Payer: Self-pay | Admitting: Internal Medicine

## 2011-02-23 ENCOUNTER — Ambulatory Visit (INDEPENDENT_AMBULATORY_CARE_PROVIDER_SITE_OTHER): Payer: BC Managed Care – PPO | Admitting: *Deleted

## 2011-02-23 DIAGNOSIS — I739 Peripheral vascular disease, unspecified: Secondary | ICD-10-CM

## 2011-02-23 DIAGNOSIS — M79605 Pain in left leg: Secondary | ICD-10-CM

## 2011-02-23 DIAGNOSIS — M79604 Pain in right leg: Secondary | ICD-10-CM

## 2011-02-27 NOTE — Progress Notes (Signed)
Quick Note:  Voice message left on [employer] system - lab is negative, normal or otherwise stable, pt to continue same tx ______ 

## 2011-03-21 ENCOUNTER — Other Ambulatory Visit: Payer: Self-pay | Admitting: Internal Medicine

## 2011-03-23 ENCOUNTER — Encounter: Payer: Self-pay | Admitting: Internal Medicine

## 2011-03-23 ENCOUNTER — Other Ambulatory Visit: Payer: Self-pay | Admitting: *Deleted

## 2011-03-23 MED ORDER — ZOLPIDEM TARTRATE 10 MG PO TABS: 10.0000 mg | ORAL_TABLET | Freq: Every evening | ORAL | Status: DC | PRN

## 2011-03-23 NOTE — Telephone Encounter (Signed)
Printed Rx faxed to [facility] [phone]

## 2011-07-02 ENCOUNTER — Other Ambulatory Visit: Payer: Self-pay

## 2011-07-09 ENCOUNTER — Encounter: Payer: Self-pay | Admitting: Internal Medicine

## 2011-07-10 ENCOUNTER — Other Ambulatory Visit: Payer: Self-pay | Admitting: *Deleted

## 2011-07-10 ENCOUNTER — Ambulatory Visit: Payer: BC Managed Care – PPO

## 2011-07-10 ENCOUNTER — Other Ambulatory Visit: Payer: Self-pay

## 2011-07-10 DIAGNOSIS — Z0000 Encounter for general adult medical examination without abnormal findings: Secondary | ICD-10-CM

## 2011-07-10 LAB — LIPID PANEL
Cholesterol: 200 mg/dL (ref 0–200)
HDL: 57.8 mg/dL (ref >39.00)
LDL Cholesterol: 103 mg/dL — ABNORMAL HIGH (ref 0–99)
Triglycerides: 195 mg/dL — ABNORMAL HIGH (ref 0.0–149.0)
VLDL: 39 mg/dL (ref 0.0–40.0)

## 2011-07-10 LAB — BASIC METABOLIC PANEL
BUN: 18 mg/dL (ref 6–23)
Creatinine, Ser: 0.8 mg/dL (ref 0.4–1.2)
GFR: 79.31 mL/min (ref >60.00)
Potassium: 4.2 mEq/L (ref 3.5–5.1)

## 2011-07-10 LAB — CBC WITH DIFFERENTIAL/PLATELET
Basophils Relative: 0.5 % (ref 0.0–3.0)
Eosinophils Absolute: 0.1 10*3/uL (ref 0.0–0.7)
MCHC: 34.3 g/dL (ref 30.0–36.0)
MCV: 87.5 fl (ref 78.0–100.0)
Monocytes Absolute: 0.3 10*3/uL (ref 0.1–1.0)
Neutrophils Relative %: 46.8 % (ref 43.0–77.0)
RBC: 4.76 Mil/uL (ref 3.87–5.11)

## 2011-07-10 LAB — HEPATIC FUNCTION PANEL
ALT: 24 U/L (ref 0–35)
Bilirubin, Direct: 0 mg/dL (ref 0.0–0.3)
Total Bilirubin: 0.6 mg/dL (ref 0.3–1.2)

## 2011-07-10 LAB — URINALYSIS, ROUTINE W REFLEX MICROSCOPIC
Ketones, ur: NEGATIVE
Total Protein, Urine: NEGATIVE
Urine Glucose: NEGATIVE
Urobilinogen, UA: 0.2 (ref 0.0–1.0)

## 2011-07-10 LAB — TSH: TSH: 0.86 u[IU]/mL (ref 0.35–5.50)

## 2011-07-17 ENCOUNTER — Encounter: Payer: Self-pay | Admitting: Internal Medicine

## 2011-07-17 ENCOUNTER — Ambulatory Visit (INDEPENDENT_AMBULATORY_CARE_PROVIDER_SITE_OTHER): Payer: BC Managed Care – PPO | Admitting: Internal Medicine

## 2011-07-17 VITALS — BP 138/78 | HR 69 | Temp 98.7°F | Ht 64.0 in | Wt 147.1 lb

## 2011-07-17 DIAGNOSIS — Z0000 Encounter for general adult medical examination without abnormal findings: Secondary | ICD-10-CM

## 2011-07-17 DIAGNOSIS — G43009 Migraine without aura, not intractable, without status migrainosus: Secondary | ICD-10-CM

## 2011-07-17 DIAGNOSIS — J309 Allergic rhinitis, unspecified: Secondary | ICD-10-CM

## 2011-07-17 DIAGNOSIS — G4700 Insomnia, unspecified: Secondary | ICD-10-CM

## 2011-07-17 DIAGNOSIS — F411 Generalized anxiety disorder: Secondary | ICD-10-CM

## 2011-07-17 DIAGNOSIS — R7302 Impaired glucose tolerance (oral): Secondary | ICD-10-CM

## 2011-07-17 DIAGNOSIS — E785 Hyperlipidemia, unspecified: Secondary | ICD-10-CM

## 2011-07-17 DIAGNOSIS — Z79899 Other long term (current) drug therapy: Secondary | ICD-10-CM

## 2011-07-17 MED ORDER — ALPRAZOLAM 1 MG PO TABS: 1.0000 mg | ORAL_TABLET | Freq: Two times a day (BID) | ORAL | Status: DC | PRN

## 2011-07-17 MED ORDER — FEXOFENADINE HCL 180 MG PO TABS: 180.0000 mg | ORAL_TABLET | Freq: Every day | ORAL | Status: DC

## 2011-07-17 MED ORDER — LUBIPROSTONE 24 MCG PO CAPS: 24.0000 ug | ORAL_CAPSULE | Freq: Two times a day (BID) | ORAL | Status: DC

## 2011-07-17 MED ORDER — OMEPRAZOLE 20 MG PO CPDR: 20.0000 mg | DELAYED_RELEASE_CAPSULE | Freq: Two times a day (BID) | ORAL | Status: DC

## 2011-07-17 MED ORDER — SUMATRIPTAN SUCCINATE 100 MG PO TABS: 100.0000 mg | ORAL_TABLET | ORAL | Status: DC | PRN

## 2011-07-17 MED ORDER — TEMAZEPAM 30 MG PO CAPS: 30.0000 mg | ORAL_CAPSULE | Freq: Every evening | ORAL | Status: AC | PRN

## 2011-07-17 MED ORDER — ATORVASTATIN CALCIUM 40 MG PO TABS: 40.0000 mg | ORAL_TABLET | Freq: Every day | ORAL | Status: DC

## 2011-07-17 NOTE — Patient Instructions (Signed)
Please increase the lipitor to 40 mg per day Take all new medications as prescribed - the allegra, and the temazepam for sleep at night OK to stop the [place] Continue all other medications as before, including the imitrex Please return in 6 months, or sooner if needed

## 2011-07-17 NOTE — Assessment & Plan Note (Signed)

## 2011-07-18 ENCOUNTER — Encounter: Payer: Self-pay | Admitting: Internal Medicine

## 2011-07-18 DIAGNOSIS — J309 Allergic rhinitis, unspecified: Secondary | ICD-10-CM

## 2011-07-18 HISTORY — DX: Allergic rhinitis, unspecified: J30.9

## 2011-07-18 NOTE — Assessment & Plan Note (Signed)
Marked increased recently,  Declines zoloft or other ssri, cont xanax prn,  to f/u any worsening symptoms or concerns, verified nonsuicidal and no significant depressvie symptoms

## 2011-07-18 NOTE — Assessment & Plan Note (Signed)
Uncontroleld, goal ldl < 100 - to increase the lipitor to 40 mg, follow lower chol diet

## 2011-07-18 NOTE — Progress Notes (Signed)
Subjective:    Patient ID: Molly Lewis, female    DOB: [birth year], 60 y.o.   MRN: [id number]  HPI  Here for wellness and f/u;  Overall doing ok;  Pt denies CP, worsening SOB, DOE, wheezing, orthopnea, PND, worsening LE edema, palpitations, dizziness or syncope.  Pt denies neurological change such as  facial or extremity weakness.  Pt denies polydipsia, polyuria, or low sugar symptoms. Pt states overall good compliance with treatment and medications, good tolerability, and trying to follow lower cholesterol diet.  Pt denies worsening depressive symptoms, suicidal ideation or panic. No fever, night sweats, loss of appetite, or other constitutional symptoms, except wt down from 169 to 147 over the past yr.  Pt states good ability with ADL's, low fall risk, home safety reviewed and adequate, no significant changes in hearing or vision, and occasionally active with exercise.  Has mult social stressors, and increased insomnia with difficulty getting and staying asleep.  Does have several wks ongoing nasal allergy symptoms with clear congestion, itch and sneeze, without fever, pain, ST, cough or wheezing.  Also with daily [facility] for 2 mo, migrainous type seems to happen about 4pm daily, now out of imitrex, does not want to see [facility] at this time.   Past Medical History  Diagnosis Date  . Impaired glucose tolerance 02/16/2011  . ANXIETY 06/23/2007  . DEPRESSION 06/23/2007  . GERD 06/23/2007  . HYPERLIPIDEMIA 06/23/2007  . BREAST BIOPSY, HX OF 06/23/2007  . COLONIC POLYPS, HX OF 06/27/2008  . COMMON MIGRAINE 06/23/2007  . INSOMNIA-SLEEP DISORDER-UNSPEC 06/26/2009  . Irritable bowel syndrome 06/23/2007  . OSTEOARTHRITIS, LUMBAR SPINE 06/23/2007  . OSTEOPENIA 07/11/2008  . Chronic constipation    Past Surgical History  Procedure Date  . Total abdominal hysterectomy   . Appendectomy     reports that she has quit smoking. She does not have any smokeless tobacco history on file. She reports that she  drinks alcohol. She reports that she does not use illicit drugs. family history includes Brain cancer in her maternal grandmother and mother; Heart disease in her father; and Hypertension in her father. Allergies  Allergen Reactions  . Codeine     REACTION: violentl nausea and vomiting   Current Outpatient Prescriptions on File Prior to Visit  Medication Sig Dispense Refill  . Calcium Carbonate-Vit D-Min (CALCIUM 1200) 1200-1000 MG-UNIT CHEW Chew by mouth daily.          Review of Systems Review of Systems  Constitutional: Negative for diaphoresis, activity change, appetite change and unexpected weight change.  HENT: Negative for hearing loss, ear pain, facial swelling, mouth sores and neck stiffness.   Eyes: Negative for pain, redness and visual disturbance.  Respiratory: Negative for shortness of breath and wheezing.   Cardiovascular: Negative for chest pain and palpitations.  Gastrointestinal: Negative for diarrhea, blood in stool, abdominal distention and rectal pain.  Genitourinary: Negative for hematuria, flank pain and decreased urine volume.  Musculoskeletal: Negative for myalgias and joint swelling.  Skin: Negative for color change and wound.  Neurological: Negative for syncope and numbness.  Hematological: Negative for adenopathy.  Psychiatric/Behavioral: Negative for hallucinations, self-injury, decreased concentration and agitation.      Objective:   Physical Exam BP 138/78  Pulse 69  Temp(Src) 98.7 F (37.1 C) (Oral)  Ht 5\' 4"  (1.626 m)  Wt 147 lb 2 oz (66.735 kg)  BMI 25.25 kg/m2  SpO2 97% Physical Exam  VS noted Constitutional: Pt is oriented to person, place, and time. Appears well-developed  and well-nourished.  HENT:  Head: Normocephalic and atraumatic.  Right Ear: External ear normal.  Left Ear: External ear normal.  Nose: Nose normal.  Mouth/Throat: Oropharynx is clear and moist.  Bilat tm's mild erythema.  Sinus nontender.  Pharynx mild  erythema Eyes: Conjunctivae and EOM are normal. Pupils are equal, round, and reactive to light.  Neck: Normal range of motion. Neck supple. No JVD present. No tracheal deviation present.  Cardiovascular: Normal rate, regular rhythm, normal heart sounds and intact distal pulses.   Pulmonary/Chest: Effort normal and breath sounds normal.  Abdominal: Soft. Bowel sounds are normal. There is no tenderness.  Musculoskeletal: Normal range of motion. Exhibits no edema.  Lymphadenopathy:  Has no cervical adenopathy.  Neurological: Pt is alert and oriented to person, place, and time. Pt has normal reflexes. No cranial nerve deficit.  Skin: Skin is warm and dry. No rash noted.  Psychiatric:  Has  normal mood and affect. Behavior is normal. 2+ nervous    Assessment & Plan:

## 2011-07-18 NOTE — Assessment & Plan Note (Signed)
Asympt, for a1c check next visit,  to f/u any worsening symptoms or concerns

## 2011-07-18 NOTE — Assessment & Plan Note (Signed)
Ok to d/c Agency, for temazepam 30 qhs prn,  to f/u any worsening symptoms or concerns

## 2011-07-18 NOTE — Assessment & Plan Note (Signed)
Ok for imitrex refill, has daily [facility] but delcines [facility] at this time,  to f/u any worsening symptoms or concerns

## 2011-07-18 NOTE — Assessment & Plan Note (Signed)
Mild, for allegra asd daily prn,  to f/u any worsening symptoms or concerns

## 2011-12-14 ENCOUNTER — Other Ambulatory Visit: Payer: Self-pay

## 2011-12-14 MED ORDER — OMEPRAZOLE 20 MG PO CPDR: 20.0000 mg | DELAYED_RELEASE_CAPSULE | Freq: Two times a day (BID) | ORAL | Status: DC

## 2011-12-14 MED ORDER — ATORVASTATIN CALCIUM 40 MG PO TABS: 40.0000 mg | ORAL_TABLET | Freq: Every day | ORAL | Status: DC

## 2012-01-06 ENCOUNTER — Other Ambulatory Visit (INDEPENDENT_AMBULATORY_CARE_PROVIDER_SITE_OTHER): Payer: BC Managed Care – PPO

## 2012-01-06 DIAGNOSIS — E785 Hyperlipidemia, unspecified: Secondary | ICD-10-CM

## 2012-01-06 DIAGNOSIS — Z79899 Other long term (current) drug therapy: Secondary | ICD-10-CM

## 2012-01-06 DIAGNOSIS — R7302 Impaired glucose tolerance (oral): Secondary | ICD-10-CM

## 2012-01-06 DIAGNOSIS — R7309 Other abnormal glucose: Secondary | ICD-10-CM

## 2012-01-06 LAB — BASIC METABOLIC PANEL
CO2: 26 mEq/L (ref 19–32)
Chloride: 107 mEq/L (ref 96–112)
Creatinine, Ser: 0.9 mg/dL (ref 0.4–1.2)

## 2012-01-06 LAB — HEPATIC FUNCTION PANEL
AST: 35 U/L (ref 0–37)
Albumin: 4 g/dL (ref 3.5–5.2)
Alkaline Phosphatase: 96 U/L (ref 39–117)
Total Bilirubin: 0.5 mg/dL (ref 0.3–1.2)

## 2012-01-06 LAB — LIPID PANEL
Cholesterol: 181 mg/dL (ref 0–200)
Total CHOL/HDL Ratio: 3

## 2012-01-06 LAB — LDL CHOLESTEROL, DIRECT: Direct LDL: 104 mg/dL

## 2012-01-15 ENCOUNTER — Ambulatory Visit (INDEPENDENT_AMBULATORY_CARE_PROVIDER_SITE_OTHER): Payer: BC Managed Care – PPO | Admitting: Internal Medicine

## 2012-01-15 ENCOUNTER — Encounter: Payer: Self-pay | Admitting: Internal Medicine

## 2012-01-15 VITALS — BP 130/80 | HR 72 | Temp 97.9°F | Resp 16 | Wt 161.0 lb

## 2012-01-15 DIAGNOSIS — F3289 Other specified depressive episodes: Secondary | ICD-10-CM

## 2012-01-15 DIAGNOSIS — K219 Gastro-esophageal reflux disease without esophagitis: Secondary | ICD-10-CM

## 2012-01-15 DIAGNOSIS — R7309 Other abnormal glucose: Secondary | ICD-10-CM

## 2012-01-15 DIAGNOSIS — F329 Major depressive disorder, single episode, unspecified: Secondary | ICD-10-CM

## 2012-01-15 DIAGNOSIS — Z0000 Encounter for general adult medical examination without abnormal findings: Secondary | ICD-10-CM

## 2012-01-15 DIAGNOSIS — E785 Hyperlipidemia, unspecified: Secondary | ICD-10-CM

## 2012-01-15 DIAGNOSIS — R7302 Impaired glucose tolerance (oral): Secondary | ICD-10-CM

## 2012-01-15 NOTE — Patient Instructions (Signed)
Continue all other medications as before Please continue your efforts at being more active, low cholesterol diet, and weight control. Please return in 6 mo with Lab testing done 3-5 days before

## 2012-01-16 ENCOUNTER — Encounter: Payer: Self-pay | Admitting: Internal Medicine

## 2012-01-16 NOTE — Progress Notes (Signed)
Subjective:    Patient ID: Molly Lewis, female    DOB: June 23, 1951, 61 y.o.   MRN: [id number]  HPI  Here to f/u; overall doing ok,  Pt denies chest pain, increased sob or doe, wheezing, orthopnea, PND, increased LE swelling, palpitations, dizziness or syncope.  Pt denies new neurological symptoms such as new headache, or facial or extremity weakness or numbness   Pt denies polydipsia, polyuria, or low sugar symptoms such as weakness or confusion improved with po intake.  Pt states overall good compliance with meds, trying to follow lower cholesterol, diabetic diet, wt overall stable and frustrating as she tries to exercise several times per wk. Denies worsening depressive symptoms, suicidal ideation, or panic, though has ongoing anxiety, not increased recently.  Denies worsening reflux, dysphagia, abd pain, n/v, bowel change or blood. Past Medical History  Diagnosis Date  . Impaired glucose tolerance 02/16/2011  . ANXIETY 06/23/2007  . DEPRESSION 06/23/2007  . GERD 06/23/2007  . HYPERLIPIDEMIA 06/23/2007  . BREAST BIOPSY, HX OF 06/23/2007  . COLONIC POLYPS, HX OF 06/27/2008  . COMMON MIGRAINE 06/23/2007  . INSOMNIA-SLEEP DISORDER-UNSPEC 06/26/2009  . Irritable bowel syndrome 06/23/2007  . OSTEOARTHRITIS, LUMBAR SPINE 06/23/2007  . OSTEOPENIA 07/11/2008  . Chronic constipation   . Allergic rhinitis, cause unspecified 07/18/2011   Past Surgical History  Procedure Date  . Total abdominal hysterectomy   . Appendectomy     reports that she has quit smoking. She does not have any smokeless tobacco history on file. She reports that she drinks alcohol. She reports that she does not use illicit drugs. family history includes Brain cancer in her maternal grandmother and mother; Heart disease in her father; and Hypertension in her father. Allergies  Allergen Reactions  . Codeine     REACTION: violentl nausea and vomiting   Current Outpatient Prescriptions on File Prior to Visit  Medication  Sig Dispense Refill  . ALPRAZolam (XANAX) 1 MG tablet Take 1 tablet (1 mg total) by mouth 2 (two) times daily as needed.  60 tablet  5  . atorvastatin (LIPITOR) 40 MG tablet Take 1 tablet (40 mg total) by mouth daily.  90 tablet  1  . Calcium Carbonate-Vit D-Min (CALCIUM 1200) 1200-1000 MG-UNIT CHEW Chew by mouth daily.        . fexofenadine (ALLEGRA) 180 MG tablet Take 1 tablet (180 mg total) by mouth daily.  90 tablet  3  . lubiprostone (AMITIZA) 24 MCG capsule Take 1 capsule (24 mcg total) by mouth 2 (two) times daily.  180 capsule  3  . omeprazole (PRILOSEC) 20 MG capsule Take 1 capsule (20 mg total) by mouth 2 (two) times daily.  180 capsule  1  . SUMAtriptan (IMITREX) 100 MG tablet Take 1 tablet (100 mg total) by mouth every 2 (two) hours as needed for migraine. 1 by mouth every other day as needed  10 tablet  5   Review of Systems Review of Systems  Constitutional: Negative for diaphoresis and unexpected weight change.  Eyes: Negative for photophobia and visual disturbance.  Respiratory: Negative for choking and stridor.   Gastrointestinal: Negative for vomiting and blood in stool.  Genitourinary: Negative for hematuria and decreased urine volume.  Musculoskeletal: Negative for gait problem.  Skin: Negative for color change and wound.  Neurological: Negative for tremors and numbness.  Psychiatric/Behavioral: Negative for decreased concentration. The patient is not hyperactive.       Objective:   Physical Exam BP 130/80  Pulse 72  Temp(Src) 97.9  F (36.6 C) (Oral)  Resp 16  Wt 161 lb (73.029 kg)  SpO2 95% Physical Exam  VS noted Constitutional: Pt appears well-developed and well-nourished.  HENT: Head: Normocephalic.  Right Ear: External ear normal.  Left Ear: External ear normal.  Eyes: Conjunctivae and EOM are normal. Pupils are equal, round, and reactive to light.  Neck: Normal range of motion. Neck supple.  Cardiovascular: Normal rate and regular rhythm.     Pulmonary/Chest: Effort normal and breath sounds normal. Abd: soft, NT, + BS  Neurological: Pt is alert. Not confused Skin: Skin is warm. No erythema.  Psychiatric: Pt behavior is normal. Thought content normal.1+ nervous     Assessment & Plan:

## 2012-01-16 NOTE — Assessment & Plan Note (Signed)
stable overall by hx and exam, and pt to continue medical treatment as before 

## 2012-01-16 NOTE — Assessment & Plan Note (Signed)
stable overall by hx and exam, most recent data reviewed with pt, and pt to continue medical treatment as before Lab Results  Component Value Date   WBC 4.9 07/10/2011   HGB 14.3 07/10/2011   HCT 41.7 07/10/2011   PLT 133.0* 07/10/2011   GLUCOSE 115* 01/06/2012   CHOL 181 01/06/2012   TRIG 212.0* 01/06/2012   HDL 55.90 01/06/2012   LDLDIRECT 104.0 01/06/2012   LDLCALC 103* 07/10/2011   ALT 37* 01/06/2012   AST 35 01/06/2012   NA 142 01/06/2012   K 4.2 01/06/2012   CL 107 01/06/2012   CREATININE 0.9 01/06/2012   BUN 15 01/06/2012   CO2 26 01/06/2012   TSH 0.86 07/10/2011   HGBA1C 5.7 01/06/2012

## 2012-01-16 NOTE — Assessment & Plan Note (Signed)
stable overall by hx and exam, most recent data reviewed with pt, and pt to continue medical treatment as before Lab Results  Component Value Date   HGBA1C 5.7 01/06/2012

## 2012-01-16 NOTE — Assessment & Plan Note (Signed)
stable overall by hx and exam, most recent data reviewed with pt, and pt to continue medical treatment as before Lab Results  Component Value Date   LDLCALC 103* 07/10/2011

## 2012-03-21 ENCOUNTER — Other Ambulatory Visit: Payer: Self-pay

## 2012-03-21 MED ORDER — ALPRAZOLAM 1 MG PO TABS: 1.0000 mg | ORAL_TABLET | Freq: Two times a day (BID) | ORAL | Status: DC | PRN

## 2012-03-21 NOTE — Telephone Encounter (Signed)
Please call pt to verify pharmacy to send refill.

## 2012-03-21 NOTE — Telephone Encounter (Signed)
Called the patient faxed hardcopy to [facility] per pt. Request.

## 2012-03-21 NOTE — Telephone Encounter (Signed)
Done hardcopy to robin  

## 2012-06-13 ENCOUNTER — Ambulatory Visit (INDEPENDENT_AMBULATORY_CARE_PROVIDER_SITE_OTHER): Payer: BC Managed Care – PPO | Admitting: Internal Medicine

## 2012-06-13 ENCOUNTER — Ambulatory Visit (INDEPENDENT_AMBULATORY_CARE_PROVIDER_SITE_OTHER)
Admission: RE | Admit: 2012-06-13 | Discharge: 2012-06-13 | Disposition: A | Discharge status: Home / Self Care | Payer: BC Managed Care – PPO | Source: Ambulatory Visit | Attending: Internal Medicine | Admitting: Internal Medicine

## 2012-06-13 ENCOUNTER — Encounter: Payer: Self-pay | Admitting: Internal Medicine

## 2012-06-13 ENCOUNTER — Other Ambulatory Visit (INDEPENDENT_AMBULATORY_CARE_PROVIDER_SITE_OTHER): Payer: BC Managed Care – PPO

## 2012-06-13 VITALS — BP 120/82 | HR 72 | Temp 98.6°F | Ht 64.0 in | Wt 145.5 lb

## 2012-06-13 DIAGNOSIS — R109 Unspecified abdominal pain: Secondary | ICD-10-CM

## 2012-06-13 DIAGNOSIS — G43009 Migraine without aura, not intractable, without status migrainosus: Secondary | ICD-10-CM

## 2012-06-13 DIAGNOSIS — E785 Hyperlipidemia, unspecified: Secondary | ICD-10-CM

## 2012-06-13 DIAGNOSIS — R7302 Impaired glucose tolerance (oral): Secondary | ICD-10-CM

## 2012-06-13 DIAGNOSIS — R7309 Other abnormal glucose: Secondary | ICD-10-CM

## 2012-06-13 DIAGNOSIS — Z23 Encounter for immunization: Secondary | ICD-10-CM

## 2012-06-13 LAB — LIPID PANEL
Cholesterol: 146 mg/dL (ref 0–200)
HDL: 54.5 mg/dL (ref >39.00)
Triglycerides: 96 mg/dL (ref 0.0–149.0)
VLDL: 19.2 mg/dL (ref 0.0–40.0)

## 2012-06-13 LAB — CBC WITH DIFFERENTIAL/PLATELET
Basophils Relative: 0.5 % (ref 0.0–3.0)
Eosinophils Absolute: 0 10*3/uL (ref 0.0–0.7)
Hemoglobin: 13.3 g/dL (ref 12.0–15.0)
MCHC: 33.4 g/dL (ref 30.0–36.0)
MCV: 87.5 fl (ref 78.0–100.0)
Monocytes Absolute: 0.3 10*3/uL (ref 0.1–1.0)
Neutro Abs: 2.3 10*3/uL (ref 1.4–7.7)
RBC: 4.55 Mil/uL (ref 3.87–5.11)
RDW: 12.8 % (ref 11.5–14.6)

## 2012-06-13 LAB — URINALYSIS, ROUTINE W REFLEX MICROSCOPIC
Bilirubin Urine: NEGATIVE
Ketones, ur: NEGATIVE
Urine Glucose: NEGATIVE
Urobilinogen, UA: 0.2 (ref 0.0–1.0)

## 2012-06-13 LAB — BASIC METABOLIC PANEL
Calcium: 10 mg/dL (ref 8.4–10.5)
Chloride: 107 mEq/L (ref 96–112)
Creatinine, Ser: 0.7 mg/dL (ref 0.4–1.2)
Sodium: 141 mEq/L (ref 135–145)

## 2012-06-13 LAB — HEMOGLOBIN A1C: Hgb A1c MFr Bld: 5.5 % (ref 4.6–6.5)

## 2012-06-13 LAB — HEPATIC FUNCTION PANEL
ALT: 27 U/L (ref 0–35)
Alkaline Phosphatase: 59 U/L (ref 39–117)
Bilirubin, Direct: 0.1 mg/dL (ref 0.0–0.3)
Total Bilirubin: 0.6 mg/dL (ref 0.3–1.2)

## 2012-06-13 MED ORDER — SUMATRIPTAN SUCCINATE 100 MG PO TABS: 100.0000 mg | ORAL_TABLET | ORAL | Status: DC | PRN

## 2012-06-13 MED ORDER — LINACLOTIDE 145 MCG PO CAPS: 145.0000 ug | ORAL_CAPSULE | Freq: Every day | ORAL | Status: DC

## 2012-06-13 NOTE — Patient Instructions (Addendum)
OK to stop the Amitiza Please try the Linzess at 145 mcg - 1 per day Please go to XRAY in the Basement for the x-ray test Please go to LAB in the Basement for the blood and/or urine tests to be done today You will be contacted by phone if any changes need to be made immediately.  Otherwise, you will receive a letter about your results with an explanation. Please remember to sign up for My Chart at your earliest convenience, as this will be important to you in the future with finding out test results. You will be contacted regarding the referral for: Abd ultrasound Your imitrex was refilled today You had the flu shot today

## 2012-06-16 ENCOUNTER — Encounter: Payer: Self-pay | Admitting: Internal Medicine

## 2012-06-17 ENCOUNTER — Encounter: Payer: Self-pay | Admitting: Internal Medicine

## 2012-06-17 ENCOUNTER — Ambulatory Visit
Admission: RE | Admit: 2012-06-17 | Discharge: 2012-06-17 | Disposition: A | Discharge status: Home / Self Care | Payer: BC Managed Care – PPO | Source: Ambulatory Visit | Attending: Internal Medicine | Admitting: Internal Medicine

## 2012-06-17 DIAGNOSIS — R109 Unspecified abdominal pain: Secondary | ICD-10-CM

## 2012-06-19 ENCOUNTER — Encounter: Payer: Self-pay | Admitting: Internal Medicine

## 2012-06-19 NOTE — Assessment & Plan Note (Signed)
stable overall by hx and exam, most recent data reviewed with pt, and pt to continue medical treatment as before Lab Results  Component Value Date   HGBA1C 5.5 06/13/2012

## 2012-06-19 NOTE — Assessment & Plan Note (Addendum)
Unclear etiology, but I suspect exacerbation of her chronic constipation with recent tx not working well; for labs and film today to further assess, but ok to change amitiza to linzess, also for abd u/s to r/o less likely GB issue

## 2012-06-19 NOTE — Progress Notes (Signed)
Subjective:    Patient ID: Molly Lewis, female    DOB: Oct 16, 1950, 61 y.o.   MRN: [id number]  HPI  Here with 2 wks of abd pain, intermittent and diffuse without radiation but worse lower yesterday and woke her up which prompted her to make an appt, but  Denies urinary symptoms such as dysuria, frequency, urgency,or hematuria.  Denies worsening reflux, dysphagia, fever, n/v, bowel change or blood, though she has ongoing constipation not always responding to the amitiza.  Has lost several lbs recently but doing this intentionally by simply reducing calories. Nothing seems to make better or worse such as eating, position.  Pt denies chest pain, increased sob or doe, wheezing, orthopnea, PND, increased LE swelling, palpitations, dizziness or syncope.  Pt denies new neurological symptoms such as new headache, or facial or extremity weakness or numbness.  Pt denies polydipsia, polyuria. Past Medical History  Diagnosis Date  . Impaired glucose tolerance 02/16/2011  . ANXIETY 06/23/2007  . DEPRESSION 06/23/2007  . GERD 06/23/2007  . HYPERLIPIDEMIA 06/23/2007  . BREAST BIOPSY, HX OF 06/23/2007  . COLONIC POLYPS, HX OF 06/27/2008  . COMMON MIGRAINE 06/23/2007  . INSOMNIA-SLEEP DISORDER-UNSPEC 06/26/2009  . Irritable bowel syndrome 06/23/2007  . OSTEOARTHRITIS, LUMBAR SPINE 06/23/2007  . OSTEOPENIA 07/11/2008  . Chronic constipation   . Allergic rhinitis, cause unspecified 07/18/2011   Past Surgical History  Procedure Date  . Total abdominal hysterectomy   . Appendectomy     reports that she has quit smoking. She does not have any smokeless tobacco history on file. She reports that she drinks alcohol. She reports that she does not use illicit drugs. family history includes Brain cancer in her maternal grandmother and mother; Heart disease in her father; and Hypertension in her father. Allergies  Allergen Reactions  . Codeine     REACTION: violentl nausea and vomiting   Current Outpatient  Prescriptions on File Prior to Visit  Medication Sig Dispense Refill  . ALPRAZolam (XANAX) 1 MG tablet Take 1 tablet (1 mg total) by mouth 2 (two) times daily as needed.  60 tablet  5  . atorvastatin (LIPITOR) 40 MG tablet Take 1 tablet (40 mg total) by mouth daily.  90 tablet  1  . Calcium Carbonate-Vit D-Min (CALCIUM 1200) 1200-1000 MG-UNIT CHEW Chew by mouth daily.        . fexofenadine (ALLEGRA) 180 MG tablet Take 1 tablet (180 mg total) by mouth daily.  90 tablet  3  . omeprazole (PRILOSEC) 20 MG capsule Take 1 capsule (20 mg total) by mouth 2 (two) times daily.  180 capsule  1  . SUMAtriptan (IMITREX) 100 MG tablet Take 1 tablet (100 mg total) by mouth every 2 (two) hours as needed for migraine. 1 by mouth every other day as needed  10 tablet  5  . Linaclotide (LINZESS) 145 MCG CAPS Take 1 capsule (145 mcg total) by mouth daily.  30 capsule  11   Review of Systems  Constitutional: Negative for diaphoresis and unexpected weight change.  HENT: Negative for tinnitus.   Eyes: Negative for photophobia and visual disturbance.  Respiratory: Negative for choking and stridor.   Gastrointestinal: Negative for vomiting and blood in stool.  Genitourinary: Negative for hematuria and decreased urine volume.  Musculoskeletal: Negative for gait problem.  Skin: Negative for color change and wound.  Neurological: Negative for tremors and numbness.  Psychiatric/Behavioral: Negative for decreased concentration. The patient is not hyperactive.      Objective:   Physical Exam  BP 120/82  Pulse 72  Temp 98.6 F (37 C) (Oral)  Ht 5\' 4"  (1.626 m)  Wt 145 lb 8 oz (65.998 kg)  BMI 24.97 kg/m2  SpO2 97% Physical Exam  VS noted Constitutional: Pt appears well-developed and well-nourished.  HENT: Head: Normocephalic.  Right Ear: External ear normal.  Left Ear: External ear normal.  Eyes: Conjunctivae and EOM are normal. Pupils are equal, round, and reactive to light.  Neck: Normal range of motion. Neck  supple.  Cardiovascular: Normal rate and regular rhythm.   Pulmonary/Chest: Effort normal and breath sounds normal.  Abd:  Soft, non-distended, + BS with mid epigastric tender without guarding or rebound,  No lower abd tender on today's exam Neurological: Pt is alert. Not confused  Skin: Skin is warm. No erythema.  Psychiatric: Pt behavior is normal. Thought content normal.     Assessment & Plan:

## 2012-06-19 NOTE — Assessment & Plan Note (Signed)
For imitrex refill, pt states overall no change in freq and severity of symtpoms, and med works well

## 2012-06-19 NOTE — Assessment & Plan Note (Signed)
Doubt current issue related to lipitor, but may need to hold to see if helps if constipation persists

## 2012-06-27 ENCOUNTER — Other Ambulatory Visit: Payer: Self-pay | Admitting: Internal Medicine

## 2012-07-06 ENCOUNTER — Other Ambulatory Visit: Payer: Self-pay

## 2012-07-06 MED ORDER — OMEPRAZOLE 20 MG PO CPDR: 20.0000 mg | DELAYED_RELEASE_CAPSULE | Freq: Two times a day (BID) | ORAL | Status: DC

## 2012-07-18 ENCOUNTER — Encounter: Payer: Self-pay | Admitting: Internal Medicine

## 2012-07-18 ENCOUNTER — Ambulatory Visit (INDEPENDENT_AMBULATORY_CARE_PROVIDER_SITE_OTHER): Payer: BC Managed Care – PPO | Admitting: Internal Medicine

## 2012-07-18 VITALS — BP 114/70 | HR 65 | Temp 98.1°F | Ht 64.0 in | Wt 142.1 lb

## 2012-07-18 DIAGNOSIS — Z0000 Encounter for general adult medical examination without abnormal findings: Secondary | ICD-10-CM

## 2012-07-18 MED ORDER — ALPRAZOLAM 1 MG PO TABS: 1.0000 mg | ORAL_TABLET | Freq: Two times a day (BID) | ORAL | Status: DC | PRN

## 2012-07-18 MED ORDER — FEXOFENADINE HCL 180 MG PO TABS: 180.0000 mg | ORAL_TABLET | Freq: Every day | ORAL | Status: DC

## 2012-07-18 MED ORDER — SUMATRIPTAN SUCCINATE 100 MG PO TABS: 100.0000 mg | ORAL_TABLET | ORAL | Status: DC | PRN

## 2012-07-18 NOTE — Patient Instructions (Addendum)
Please remember to followup with your GYN for the yearly pap smear and/or mammogram Please continue your efforts at being more active, low cholesterol diet, and weight control. Continue all other medications as before Your refills were done today as requested Please have the pharmacy call with any other refills you may need. Please return in 1 year for your yearly visit, or sooner if needed, with Lab testing done 3-5 days before

## 2012-07-18 NOTE — Assessment & Plan Note (Signed)

## 2012-07-18 NOTE — Progress Notes (Signed)
Subjective:    Patient ID: Molly Lewis, female    DOB: 04/22/1951, 61 y.o.   MRN: [id number]  HPI  Here for wellness and f/u;  Overall doing ok;  Pt denies CP, worsening SOB, DOE, wheezing, orthopnea, PND, worsening LE edema, palpitations, dizziness or syncope.  Pt denies neurological change such as new Headache, facial or extremity weakness.  Pt denies polydipsia, polyuria, or low sugar symptoms. Pt states overall good compliance with treatment and medications, good tolerability, and trying to follow lower cholesterol diet.  Pt denies worsening depressive symptoms, suicidal ideation or panic. No fever, wt loss, night sweats, loss of appetite, or other constitutional symptoms.  Pt states good ability with ADL's, low fall risk, home safety reviewed and adequate, no significant changes in hearing or vision, and occasionally active with exercise.  Constipation has improved, and now takes an apple per day to help, not using the linzess.  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 Impaired glucose tolerance 02/16/2011  . ANXIETY 06/23/2007  . DEPRESSION 06/23/2007  . GERD 06/23/2007  . HYPERLIPIDEMIA 06/23/2007  . BREAST BIOPSY, HX OF 06/23/2007  . COLONIC POLYPS, HX OF 06/27/2008  . COMMON MIGRAINE 06/23/2007  . INSOMNIA-SLEEP DISORDER-UNSPEC 06/26/2009  . Irritable bowel syndrome 06/23/2007  . OSTEOARTHRITIS, LUMBAR SPINE 06/23/2007  . OSTEOPENIA 07/11/2008  . Chronic constipation   . Allergic rhinitis, cause unspecified 07/18/2011   Past Surgical History  Procedure Date  . Total abdominal hysterectomy   . Appendectomy     reports that she has quit smoking. She does not have any smokeless tobacco history on file. She reports that she drinks alcohol. She reports that she does not use illicit drugs. family history includes Brain cancer in her maternal grandmother and mother; Heart disease in her father; and Hypertension in her father. Allergies    Allergen Reactions  . Codeine     REACTION: violentl nausea and vomiting   Current Outpatient Prescriptions on File Prior to Visit  Medication Sig Dispense Refill  . Calcium Carbonate-Vit D-Min (CALCIUM 1200) 1200-1000 MG-UNIT CHEW Chew by mouth daily.        Marland Kitchen omeprazole (PRILOSEC) 20 MG capsule Take 1 capsule (20 mg total) by mouth 2 (two) times daily.  180 capsule  1  . [DISCONTINUED] SUMAtriptan (IMITREX) 100 MG tablet Take 1 tablet (100 mg total) by mouth every 2 (two) hours as needed for migraine. 1 by mouth every other day as needed  10 tablet  5  . atorvastatin (LIPITOR) 40 MG tablet TAKE 1 TABLET DAILY  90 tablet  3  . Linaclotide (LINZESS) 145 MCG CAPS Take 1 capsule (145 mcg total) by mouth daily.  30 capsule  11  . [DISCONTINUED] fexofenadine (ALLEGRA) 180 MG tablet Take 1 tablet (180 mg total) by mouth daily.  90 tablet  3   Review of Systems Review of Systems  Constitutional: Negative for diaphoresis, activity change, appetite change and unexpected weight change.  HENT: Negative for hearing loss, ear pain, facial swelling, mouth sores and neck stiffness.   Eyes: Negative for pain, redness and visual disturbance.  Respiratory: Negative for shortness of breath and wheezing.   Cardiovascular: Negative for chest pain and palpitations.  Gastrointestinal: Negative for diarrhea, blood in stool, abdominal distention and rectal pain.  Genitourinary: Negative for hematuria, flank pain and decreased urine volume.  Musculoskeletal: Negative for myalgias and joint swelling.  Skin: Negative for color change and wound.  Neurological: Negative for syncope  and numbness.  Hematological: Negative for adenopathy.  Psychiatric/Behavioral: Negative for hallucinations, self-injury, decreased concentration and agitation.      Objective:   Physical Exam BP 114/70  Pulse 65  Temp 98.1 F (36.7 C) (Oral)  Ht 5\' 4"  (1.626 m)  Wt 142 lb 2 oz (64.467 kg)  BMI 24.40 kg/m2  SpO2 96% Physical  Exam  VS noted Constitutional: Pt is oriented to person, place, and time. Appears well-developed and well-nourished.  HENT:  Head: Normocephalic and atraumatic.  Right Ear: External ear normal.  Left Ear: External ear normal.  Nose: Nose normal.  Mouth/Throat: Oropharynx is clear and moist.  Eyes: Conjunctivae and EOM are normal. Pupils are equal, round, and reactive to light.  Neck: Normal range of motion. Neck supple. No JVD present. No tracheal deviation present.  Cardiovascular: Normal rate, regular rhythm, normal heart sounds and intact distal pulses.   Pulmonary/Chest: Effort normal and breath sounds normal.  Abdominal: Soft. Bowel sounds are normal. There is no tenderness.  Musculoskeletal: Normal range of motion. Exhibits no edema.  Lymphadenopathy:  Has no cervical adenopathy.  Neurological: Pt is alert and oriented to person, place, and time. Pt has normal reflexes. No cranial nerve deficit.  Skin: Skin is warm and dry. No rash noted.  Psychiatric:  Has  normal mood and affect. Behavior is normal.      Assessment & Plan:

## 2012-07-19 ENCOUNTER — Other Ambulatory Visit: Payer: Self-pay

## 2012-07-19 MED ORDER — SUMATRIPTAN SUCCINATE 100 MG PO TABS: 100.0000 mg | ORAL_TABLET | ORAL | Status: DC | PRN

## 2012-09-26 ENCOUNTER — Other Ambulatory Visit: Payer: Self-pay | Admitting: Internal Medicine

## 2012-09-26 DIAGNOSIS — Z1231 Encounter for screening mammogram for malignant neoplasm of breast: Secondary | ICD-10-CM

## 2012-10-23 ENCOUNTER — Ambulatory Visit (INDEPENDENT_AMBULATORY_CARE_PROVIDER_SITE_OTHER): Payer: BC Managed Care – PPO | Admitting: Physician Assistant

## 2012-10-23 VITALS — BP 125/81 | HR 94 | Temp 100.7°F | Resp 18 | Wt 148.0 lb

## 2012-10-23 DIAGNOSIS — J111 Influenza due to unidentified influenza virus with other respiratory manifestations: Secondary | ICD-10-CM

## 2012-10-23 DIAGNOSIS — R6889 Other general symptoms and signs: Secondary | ICD-10-CM

## 2012-10-23 LAB — POCT CBC
Lymph, poc: 1.5 (ref 0.6–3.4)
MCH, POC: 29 pg (ref 27–31.2)
MCHC: 32.6 g/dL (ref 31.8–35.4)
MID (cbc): 0.5 (ref 0–0.9)
MPV: 7.6 fL (ref 0–99.8)
POC MID %: 5.2 %M (ref 0–12)
Platelet Count, POC: 159 10*3/uL (ref 142–424)
RBC: 4.59 M/uL (ref 4.04–5.48)
RDW, POC: 13.3 %
WBC: 8.9 10*3/uL (ref 4.6–10.2)

## 2012-10-23 MED ORDER — OSELTAMIVIR PHOSPHATE 75 MG PO CAPS: 75.0000 mg | ORAL_CAPSULE | Freq: Two times a day (BID) | ORAL | Status: DC

## 2012-10-23 MED ORDER — IPRATROPIUM BROMIDE 0.03 % NA SOLN: 2.0000 | Freq: Two times a day (BID) | NASAL | Status: DC

## 2012-10-23 NOTE — Progress Notes (Signed)
Subjective:    Patient ID: Molly Lewis, female    DOB: [birth year], 62 y.o.   MRN: [id number]   HPI   Molly Lewis is a pleasant 62 yr old female here with concern for illness.  "It hit me like ton of bricks in the last couple of hours."  Feels weak and achy.  Has HA and nasal congestion.  Sore throat and ear pain.  Fever to 100.65F at triage.  Denies cough, SOB, wheezing.  NO abd pain or NVD.  Some back pain - but just kind of aching all over.  Denies urinary symptoms.  Not aware of any sick contacts.  She did take the flu vaccine in October.      Review of Systems  Constitutional: Positive for fever and chills.  HENT: Positive for congestion, sore throat, rhinorrhea and neck pain.   Respiratory: Negative for cough, shortness of breath and wheezing.   Cardiovascular: Negative.   Gastrointestinal: Negative.   Musculoskeletal: Positive for myalgias, back pain and arthralgias.  Neurological: Positive for headaches. Negative for dizziness and light-headedness.       Objective:   Physical Exam  Vitals reviewed. Constitutional: She is oriented to person, place, and time. She appears well-developed and well-nourished. No distress.  HENT:  Head: Normocephalic and atraumatic.  Right Ear: Ear canal normal. Tympanic membrane is injected.  Left Ear: Ear canal normal. Tympanic membrane is injected.  Nose: Right sinus exhibits no maxillary sinus tenderness and no frontal sinus tenderness. Left sinus exhibits no maxillary sinus tenderness and no frontal sinus tenderness.  Mouth/Throat: Uvula is midline, oropharynx is clear and moist and mucous membranes are normal.  Eyes: Conjunctivae are normal. No scleral icterus.  Neck: Neck supple.  Cardiovascular: Normal heart sounds.  Tachycardia present.  Exam reveals no gallop and no friction rub.   No murmur heard. Pulmonary/Chest: Effort normal and breath sounds normal. She has no wheezes. She has no rales.  Lymphadenopathy:    She has no cervical  adenopathy.  Neurological: She is alert and oriented to person, place, and time.  Skin: Skin is warm and dry.  Psychiatric: She has a normal mood and affect. Her behavior is normal.      Filed Vitals:   10/23/12 1520  BP: 125/81  Pulse: 94  Temp: 100.7 F (38.2 C)  Resp: 18     Results for orders placed in visit on 10/23/12  POCT INFLUENZA A/B      Result Value Range   Influenza A, POC Negative     Influenza B, POC Negative    POCT CBC      Result Value Range   WBC 8.9  4.6 - 10.2 K/uL   Lymph, poc 1.5  0.6 - 3.4   POC LYMPH PERCENT 16.6  10 - 50 %L   MID (cbc) 0.5  0 - 0.9   POC MID % 5.2  0 - 12 %M   POC Granulocyte 7.0 (*) 2 - 6.9   Granulocyte percent 78.2  37 - 80 %G   RBC 4.59  4.04 - 5.48 M/uL   Hemoglobin 13.3  12.2 - 16.2 g/dL   HCT, POC 30.8  65.7 - 47.9 %   MCV 88.8  80 - 97 fL   MCH, POC 29.0  27 - 31.2 pg   MCHC 32.6  31.8 - 35.4 g/dL   RDW, POC 84.6     Platelet Count, POC 159  142 - 424 K/uL   MPV 7.6  0 -  99.8 fL       Assessment & Plan:  Flu-like symptoms - Plan: POCT Influenza A/B, POCT CBC, ipratropium (ATROVENT) 0.03 % nasal spray, oseltamivir (TAMIFLU) 75 MG capsule    Molly Lewis is a very pleasant 62 yr old female with flu-like symptoms that began suddenly today.  Rapid flu is negative.  CBC is normal.  The history is certainly suggestive of influenza.  Given that symptoms just started within the last several hours, there is potentially great benefit of antiviral treatment.  Will start Tamiflu today.  Atrovent for nasal symptoms.  Discussed RTC precautions with pt who understands and is in agreement

## 2012-10-23 NOTE — Patient Instructions (Addendum)
Begin taking the Tamiflu today.  Be sure to finish the full course.  Plenty of fluids (water is best!) and rest.  You may alternate Tylenol and Advil every 4 hours for pain relief, fever reduction.  Atrovent nasal spray for relief of nasal symptoms.  Please let us know if you are worsening, not improving, or if new symptoms develop.   Influenza Facts Flu (influenza) is a contagious respiratory illness caused by the influenza viruses. It can cause mild to severe illness. While most healthy people recover from the flu without specific treatment and without complications, older people, young children, and people with certain health conditions are at higher risk for serious complications from the flu, including death. CAUSES   The flu virus is spread from person to person by respiratory droplets from coughing and sneezing.  A person can also become infected by touching an object or surface with a virus on it and then touching their mouth, eye or nose.  Adults may be able to infect others from 1 day before symptoms occur and up to 7 days after getting sick. So it is possible to give someone the flu even before you know you are sick and continue to infect others while you are sick. SYMPTOMS   Fever (usually high).  Headache.  Tiredness (can be extreme).  Cough.  Sore throat.  Runny or stuffy nose.  Body aches.  Diarrhea and vomiting may also occur, particularly in children.  These symptoms are referred to as "flu-like symptoms". A lot of different illnesses, including the common cold, can have similar symptoms. DIAGNOSIS   There are tests that can determine if you have the flu as long you are tested within the first 2 or 3 days of illness.  A doctor's exam and additional tests may be needed to identify if you have a disease that is a complicating the flu. RISKS AND COMPLICATIONS  Some of the complications caused by the flu include:  Bacterial pneumonia or progressive pneumonia caused  by the flu virus.  Loss of body fluids (dehydration).  Worsening of chronic medical conditions, such as heart failure, asthma, or diabetes.  Sinus problems and ear infections. HOME CARE INSTRUCTIONS   Seek medical care early on.  If you are at high risk from complications of the flu, consult your health-care provider as soon as you develop flu-like symptoms. Those at high risk for complications include:  People 65 years or older.  People with chronic medical conditions, including diabetes.  Pregnant women.  Young children.  Your caregiver may recommend use of an antiviral medication to help treat the flu.  If you get the flu, get plenty of rest, drink a lot of liquids, and avoid using alcohol and tobacco.  You can take over-the-counter medications to relieve the symptoms of the flu if your caregiver approves. (Never give aspirin to children or teenagers who have flu-like symptoms, particularly fever). PREVENTION  The single best way to prevent the flu is to get a flu vaccine each fall. Other measures that can help protect against the flu are:  Antiviral Medications  A number of antiviral drugs are approved for use in preventing the flu. These are prescription medications, and a doctor should be consulted before they are used.  Habits for Good Health  Cover your nose and mouth with a tissue when you cough or sneeze, throw the tissue away after you use it.  Wash your hands often with soap and water, especially after you cough or sneeze. If  you are not near water, use an alcohol-based hand cleaner.  Avoid people who are sick.  If you get the flu, stay home from work or school. Avoid contact with other people so that you do not make them sick, too.  Try not to touch your eyes, nose, or mouth as germs ore often spread this way. IN CHILDREN, EMERGENCY WARNING SIGNS THAT NEED URGENT MEDICAL ATTENTION:  Fast breathing or trouble breathing.  Bluish skin color.  Not drinking  enough fluids.  Not waking up or not interacting.  Being so irritable that the child does not want to be held.  Flu-like symptoms improve but then return with fever and worse cough.  Fever with a rash. IN ADULTS, EMERGENCY WARNING SIGNS THAT NEED URGENT MEDICAL ATTENTION:  Difficulty breathing or shortness of breath.  Pain or pressure in the chest or abdomen.  Sudden dizziness.  Confusion.  Severe or persistent vomiting. SEEK IMMEDIATE MEDICAL CARE IF:  You or someone you know is experiencing any of the symptoms above. When you arrive at the emergency center,report that you think you have the flu. You may be asked to wear a mask and/or sit in a secluded area to protect others from getting sick. MAKE SURE YOU:   Understand these instructions.  Monitor your condition.  Seek medical care if you are getting worse, or not improving. Document Released: 08/27/2003 Document Revised: 11/16/2011 Document Reviewed: 05/23/2009 [facility] Patient Information 2013 [place], [place].

## 2012-10-26 ENCOUNTER — Ambulatory Visit: Payer: BC Managed Care – PPO

## 2012-12-19 ENCOUNTER — Telehealth: Payer: Self-pay | Admitting: *Deleted

## 2012-12-19 MED ORDER — ALPRAZOLAM 1 MG PO TABS: 1.0000 mg | ORAL_TABLET | Freq: Two times a day (BID) | ORAL | Status: DC | PRN

## 2012-12-19 NOTE — Telephone Encounter (Signed)
Faxed hardcopy to [facility]. 

## 2012-12-19 NOTE — Telephone Encounter (Signed)
Done hardcopy to robin  

## 2012-12-19 NOTE — Telephone Encounter (Signed)
Pt needs refill of Alprazolam sent to [facility]-current prescription has expired-last written 07/18/2012 #60 with 5 refills-please advise.

## 2012-12-30 ENCOUNTER — Other Ambulatory Visit: Payer: Self-pay | Admitting: Dermatology

## 2013-06-21 ENCOUNTER — Encounter: Payer: Self-pay | Admitting: Gastroenterology

## 2013-06-24 ENCOUNTER — Other Ambulatory Visit: Payer: Self-pay | Admitting: Internal Medicine

## 2013-07-13 ENCOUNTER — Other Ambulatory Visit: Payer: Self-pay

## 2013-07-19 ENCOUNTER — Other Ambulatory Visit (INDEPENDENT_AMBULATORY_CARE_PROVIDER_SITE_OTHER): Payer: BC Managed Care – PPO

## 2013-07-19 ENCOUNTER — Ambulatory Visit (INDEPENDENT_AMBULATORY_CARE_PROVIDER_SITE_OTHER): Payer: BC Managed Care – PPO | Admitting: Internal Medicine

## 2013-07-19 ENCOUNTER — Encounter: Payer: Self-pay | Admitting: Internal Medicine

## 2013-07-19 VITALS — BP 122/82 | HR 77 | Temp 97.9°F | Ht 64.0 in | Wt 155.0 lb

## 2013-07-19 DIAGNOSIS — Z0000 Encounter for general adult medical examination without abnormal findings: Secondary | ICD-10-CM

## 2013-07-19 DIAGNOSIS — M722 Plantar fascial fibromatosis: Secondary | ICD-10-CM

## 2013-07-19 DIAGNOSIS — Z23 Encounter for immunization: Secondary | ICD-10-CM

## 2013-07-19 DIAGNOSIS — M7710 Lateral epicondylitis, unspecified elbow: Secondary | ICD-10-CM

## 2013-07-19 DIAGNOSIS — M7711 Lateral epicondylitis, right elbow: Secondary | ICD-10-CM

## 2013-07-19 LAB — BASIC METABOLIC PANEL
BUN: 17 mg/dL (ref 6–23)
Calcium: 10.2 mg/dL (ref 8.4–10.5)
GFR: 76.53 mL/min (ref >60.00)
Potassium: 4.4 mEq/L (ref 3.5–5.1)

## 2013-07-19 LAB — CBC WITH DIFFERENTIAL/PLATELET
Basophils Relative: 0.5 % (ref 0.0–3.0)
Eosinophils Relative: 0.9 % (ref 0.0–5.0)
HCT: 41.9 % (ref 36.0–46.0)
Lymphs Abs: 2.5 10*3/uL (ref 0.7–4.0)
MCV: 84.8 fl (ref 78.0–100.0)
Monocytes Absolute: 0.4 10*3/uL (ref 0.1–1.0)
Monocytes Relative: 7.3 % (ref 3.0–12.0)
Neutrophils Relative %: 44.5 % (ref 43.0–77.0)
RBC: 4.94 Mil/uL (ref 3.87–5.11)
WBC: 5.3 10*3/uL (ref 4.5–10.5)

## 2013-07-19 LAB — HEPATIC FUNCTION PANEL
ALT: 31 U/L (ref 0–35)
AST: 26 U/L (ref 0–37)
Bilirubin, Direct: 0.1 mg/dL (ref 0.0–0.3)
Total Protein: 7.7 g/dL (ref 6.0–8.3)

## 2013-07-19 LAB — URINALYSIS, ROUTINE W REFLEX MICROSCOPIC
Bilirubin Urine: NEGATIVE
Ketones, ur: NEGATIVE
Leukocytes, UA: NEGATIVE
Urobilinogen, UA: 0.2 (ref 0.0–1.0)

## 2013-07-19 LAB — LIPID PANEL
Cholesterol: 192 mg/dL (ref 0–200)
VLDL: 41.2 mg/dL — ABNORMAL HIGH (ref 0.0–40.0)

## 2013-07-19 MED ORDER — ALPRAZOLAM 1 MG PO TABS: 1.0000 mg | ORAL_TABLET | Freq: Two times a day (BID) | ORAL | Status: DC | PRN

## 2013-07-19 NOTE — Patient Instructions (Addendum)
You had the flu shot today Please remember to followup with your GYN for the yearly pap smear and/or mammogram You will be contacted regarding the referral for: colonoscopy Please use the OTC Forearm band for the right elbow during the day Please use the foot braces for both feet at night until pain improved You can also use Alleve as needed Please continue all other medications as before, and refills have been done if requested - the xanax  Please go to the LAB in the Basement (turn left off the elevator) for the tests to be done today You will be contacted by phone if any changes need to be made immediately.  Otherwise, you will receive a letter about your results with an explanation, but please check with MyChart first.  Please return in 1 year for your yearly visit, or sooner if needed, with Lab testing done 3-5 days before

## 2013-07-19 NOTE — Assessment & Plan Note (Signed)

## 2013-07-19 NOTE — Progress Notes (Signed)
Pre-visit discussion using our clinic review tool. No additional management support is needed unless otherwise documented below in the visit note.  

## 2013-07-19 NOTE — Progress Notes (Signed)
Subjective:    Patient ID: Molly Lewis, female    DOB: 1950/09/20, 62 y.o.   MRN: [id number]  HPI Here for wellness and f/u;  Overall doing ok;  Pt denies CP, worsening SOB, DOE, wheezing, orthopnea, PND, worsening LE edema, palpitations, dizziness or syncope.  Pt denies neurological change such as new headache, facial or extremity weakness.  Pt denies polydipsia, polyuria, or low sugar symptoms. Pt states overall good compliance with treatment and medications, good tolerability, and has been trying to follow lower cholesterol diet.  Pt denies worsening depressive symptoms, suicidal ideation or panic. No fever, night sweats, wt loss, loss of appetite, or other constitutional symptoms.  Pt states good ability with ADL's, has low fall risk, home safety reviewed and adequate, no other significant changes in hearing or vision, and only occasionally active with exercise.  Has some mild right lateral elbow tender without swelling or reduced function.  Has also bilat plantar fasciitis . Denies worsening depressive symptoms, suicidal ideation, or panic; has ongoing anxiety Past Medical History  Diagnosis Date  . Impaired glucose tolerance 02/16/2011  . ANXIETY 06/23/2007  . DEPRESSION 06/23/2007  . GERD 06/23/2007  . HYPERLIPIDEMIA 06/23/2007  . BREAST BIOPSY, HX OF 06/23/2007  . COLONIC POLYPS, HX OF 06/27/2008  . COMMON MIGRAINE 06/23/2007  . INSOMNIA-SLEEP DISORDER-UNSPEC 06/26/2009  . Irritable bowel syndrome 06/23/2007  . OSTEOARTHRITIS, LUMBAR SPINE 06/23/2007  . OSTEOPENIA 07/11/2008  . Chronic constipation   . Allergic rhinitis, cause unspecified 07/18/2011   Past Surgical History  Procedure Laterality Date  . Total abdominal hysterectomy    . Appendectomy      reports that she has quit smoking. She does not have any smokeless tobacco history on file. She reports that she does not drink alcohol or use illicit drugs. family history includes Brain cancer in her maternal grandmother and  mother; Heart disease in her father; Hypertension in her father. Allergies  Allergen Reactions  . Codeine     REACTION: violentl nausea and vomiting   Current Outpatient Prescriptions on File Prior to Visit  Medication Sig Dispense Refill  . aspirin 81 MG tablet Take 81 mg by mouth daily.      Marland Kitchen atorvastatin (LIPITOR) 40 MG tablet TAKE 1 TABLET DAILY  90 tablet  2  . Calcium Carbonate-Vit D-Min (CALCIUM 1200) 1200-1000 MG-UNIT CHEW Chew by mouth daily.        Marland Kitchen ipratropium (ATROVENT) 0.03 % nasal spray Place 2 sprays into the nose 2 (two) times daily.  30 mL  1  . omeprazole (PRILOSEC) 20 MG capsule Take 1 capsule (20 mg total) by mouth 2 (two) times daily.  180 capsule  1  . fexofenadine (ALLEGRA) 180 MG tablet Take 1 tablet (180 mg total) by mouth daily.  90 tablet  3   No current facility-administered medications on file prior to visit.   Review of Systems Constitutional: Negative for diaphoresis, activity change, appetite change or unexpected weight change.  HENT: Negative for hearing loss, ear pain, facial swelling, mouth sores and neck stiffness.   Eyes: Negative for pain, redness and visual disturbance.  Respiratory: Negative for shortness of breath and wheezing.   Cardiovascular: Negative for chest pain and palpitations.  Gastrointestinal: Negative for diarrhea, blood in stool, abdominal distention or other pain Genitourinary: Negative for hematuria, flank pain or change in urine volume.  Musculoskeletal: Negative for myalgias and joint swelling.  Skin: Negative for color change and wound.  Neurological: Negative for syncope and numbness. other than noted  Hematological: Negative for adenopathy.  Psychiatric/Behavioral: Negative for hallucinations, self-injury, decreased concentration and agitation.      Objective:   Physical Exam BP 122/82  Pulse 77  Temp(Src) 97.9 F (36.6 C) (Oral)  Ht 5\' 4"  (1.626 m)  Wt 155 lb (70.308 kg)  BMI 26.59 kg/m2  SpO2 99% VS noted,   Constitutional: Pt is oriented to person, place, and time. Appears well-developed and well-nourished.  Head: Normocephalic and atraumatic.  Right Ear: External ear normal.  Left Ear: External ear normal.  Nose: Nose normal.  Mouth/Throat: Oropharynx is clear and moist.  Eyes: Conjunctivae and EOM are normal. Pupils are equal, round, and reactive to light.  Neck: Normal range of motion. Neck supple. No JVD present. No tracheal deviation present.  Cardiovascular: Normal rate, regular rhythm, normal heart sounds and intact distal pulses.   Pulmonary/Chest: Effort normal and breath sounds normal.  Abdominal: Soft. Bowel sounds are normal. There is no tenderness. No HSM  Musculoskeletal: Normal range of motion. Exhibits no edema.  Lymphadenopathy:  Has no cervical adenopathy.  Neurological: Pt is alert and oriented to person, place, and time. Pt has normal reflexes. No cranial nerve deficit.  Skin: Skin is warm and dry. No rash noted.  Psychiatric:  Has  normal mood and affect. Behavior is normal. 1+ nervous Tender area right elbow lateral epicondylar Bilat instep/heel tender       Assessment & Plan:

## 2013-07-23 DIAGNOSIS — M722 Plantar fascial fibromatosis: Secondary | ICD-10-CM | POA: Insufficient documentation

## 2013-07-23 DIAGNOSIS — M7711 Lateral epicondylitis, right elbow: Secondary | ICD-10-CM | POA: Insufficient documentation

## 2013-07-23 NOTE — Assessment & Plan Note (Signed)
Mild, for alleve prn, forearm band during work hours

## 2013-07-23 NOTE — Assessment & Plan Note (Signed)
For nsaid, foot brace, soft soled shoes

## 2013-07-24 ENCOUNTER — Encounter: Payer: Self-pay | Admitting: Gastroenterology

## 2013-09-15 ENCOUNTER — Ambulatory Visit (AMBULATORY_SURGERY_CENTER): Payer: Self-pay

## 2013-09-15 VITALS — Ht 65.0 in | Wt 164.6 lb

## 2013-09-15 DIAGNOSIS — Z86010 Personal history of colonic polyps: Secondary | ICD-10-CM

## 2013-09-15 MED ORDER — NA SULFATE-K SULFATE-MG SULF 17.5-3.13-1.6 GM/177ML PO SOLN: ORAL | Status: DC

## 2013-09-21 ENCOUNTER — Encounter: Payer: Self-pay | Admitting: Gastroenterology

## 2013-09-29 ENCOUNTER — Encounter: Payer: Self-pay | Admitting: Gastroenterology

## 2013-09-29 ENCOUNTER — Ambulatory Visit (AMBULATORY_SURGERY_CENTER): Payer: BC Managed Care – PPO | Admitting: Gastroenterology

## 2013-09-29 VITALS — BP 101/37 | HR 69 | Temp 98.6°F | Resp 17 | Ht 65.0 in | Wt 164.0 lb

## 2013-09-29 DIAGNOSIS — D126 Benign neoplasm of colon, unspecified: Secondary | ICD-10-CM

## 2013-09-29 DIAGNOSIS — Z86010 Personal history of colonic polyps: Secondary | ICD-10-CM

## 2013-09-29 DIAGNOSIS — K5730 Diverticulosis of large intestine without perforation or abscess without bleeding: Secondary | ICD-10-CM

## 2013-09-29 MED ORDER — SODIUM CHLORIDE 0.9 % IV SOLN: 500.0000 mL | INTRAVENOUS | Status: DC

## 2013-09-29 NOTE — Patient Instructions (Signed)
Impressions/reommendations:  Polyp (handout given) Diverticulosis (handout given) High Fiber diet (handout given)  YOU HAD AN ENDOSCOPIC PROCEDURE TODAY AT [facility]: Refer to the procedure report that was given to you for any specific questions about what was found during the examination.  If the procedure report does not answer your questions, please call your gastroenterologist to clarify.  If you requested that your care partner not be given the details of your procedure findings, then the procedure report has been included in a sealed envelope for you to review at your convenience later.  YOU SHOULD EXPECT: Some feelings of bloating in the abdomen. Passage of more gas than usual.  Walking can help get rid of the air that was put into your GI tract during the procedure and reduce the bloating. If you had a lower endoscopy (such as a colonoscopy or flexible sigmoidoscopy) you may notice spotting of blood in your stool or on the toilet paper. If you underwent a bowel prep for your procedure, then you may not have a normal bowel movement for a few days.  DIET: Your first meal following the procedure should be a light meal and then it is ok to progress to your normal diet.  A half-sandwich or bowl of soup is an example of a good first meal.  Heavy or fried foods are harder to digest and may make you feel nauseous or bloated.  Likewise meals heavy in dairy and vegetables can cause extra gas to form and this can also increase the bloating.  Drink plenty of fluids but you should avoid alcoholic beverages for 24 hours.  ACTIVITY: Your care partner should take you home directly after the procedure.  You should plan to take it easy, moving slowly for the rest of the day.  You can resume normal activity the day after the procedure however you should NOT DRIVE or use heavy machinery for 24 hours (because of the sedation medicines used during the test).    SYMPTOMS TO REPORT IMMEDIATELY: A  gastroenterologist can be reached at any hour.  During normal business hours, 8:30 AM to 5:00 PM Monday through Friday, call [phone].  After hours and on weekends, please call the GI answering service at [phone] who will take a message and have the physician on call contact you.   Following lower endoscopy (colonoscopy or flexible sigmoidoscopy):  Excessive amounts of blood in the stool  Significant tenderness or worsening of abdominal pains  Swelling of the abdomen that is new, acute  Fever of 100F or higher  FOLLOW UP: If any biopsies were taken you will be contacted by phone or by letter within the next 1-3 weeks.  Call your gastroenterologist if you have not heard about the biopsies in 3 weeks.  Our staff will call the home number listed on your records the next business day following your procedure to check on you and address any questions or concerns that you may have at that time regarding the information given to you following your procedure. This is a courtesy call and so if there is no answer at the home number and we have not heard from you through the emergency physician on call, we will assume that you have returned to your regular daily activities without incident.  SIGNATURES/CONFIDENTIALITY: You and/or your care partner have signed paperwork which will be entered into your electronic medical record.  These signatures attest to the fact that that the information above on your After Visit Summary  has been reviewed and is understood.  Full responsibility of the confidentiality of this discharge information lies with you and/or your care-partner. 

## 2013-09-29 NOTE — Progress Notes (Signed)
Called to room to assist during endoscopic procedure.  Patient ID and intended procedure confirmed with present staff. Received instructions for my participation in the procedure from the performing physician.  

## 2013-09-29 NOTE — Op Note (Signed)
[facility]  [address]. [place], [id number]   COLONOSCOPY PROCEDURE REPORT  PATIENT: Molly Lewis, Molly Lewis  MR#: [id number] BIRTHDATE: [birth year] , 62  yrs. old GENDER: Female ENDOSCOPIST: Inda Castle, MD REFERRED BY: PROCEDURE DATE:  09/29/2013 PROCEDURE:   Colonoscopy with snare polypectomy and Colonoscopy with cold biopsy polypectomy First Screening Colonoscopy - Avg.  risk and is 50 yrs.  old or older - No.  Prior Negative Screening - Now for repeat screening. N/A  History of Adenoma - Now for follow-up colonoscopy & has been > or = to 3 yrs.  Yes hx of adenoma.  Has been 3 or more years since last colonoscopy.  Polyps Removed Today? Yes. ASA CLASS:   Class II INDICATIONS:Patient's personal history of adenomatous colon polyps. 2009 MEDICATIONS: MAC sedation, administered by CRNA and propofol (Diprivan) 400mg  IV  DESCRIPTION OF PROCEDURE:   After the risks benefits and alternatives of the procedure were thoroughly explained, informed consent was obtained.  A digital rectal exam revealed no abnormalities of the rectum.   The LB [id number]  endoscope was introduced through the anus and advanced to the cecum, which was identified by both the appendix and ileocecal valve. No adverse events experienced.   The quality of the prep was excellent using Suprep  The instrument was then slowly withdrawn as the colon was fully examined.      COLON FINDINGS: A flat polyp measuring 2 mm in size was found at the cecum.  A polypectomy was performed with cold forceps.   A sessile polyp measuring 3 mm in size was found at the splenic flexure.  A polypectomy was performed with cold forceps.  The resection was complete and the polyp tissue was completely retrieved.   Mild diverticulosis was noted in the sigmoid colon.   The colon was otherwise normal.  There was no diverticulosis, inflammation, polyps or cancers unless previously stated.  Retroflexed  views revealed no abnormalities. The time to cecum=5 minutes 46 seconds. Withdrawal time=12 minutes 33 seconds.  The scope was withdrawn and the procedure completed. COMPLICATIONS: There were no complications.  ENDOSCOPIC IMPRESSION: 1.   Flat polyp measuring 2 mm in size was found at the cecum; polypectomy was performed with cold forceps 2.   Sessile polyp measuring 3 mm in size was found at the splenic flexure; polypectomy was performed with cold forceps 3.   Mild diverticulosis was noted in the sigmoid colon 4.   The colon was otherwise normal  RECOMMENDATIONS: If the polyp(s) removed today are proven to be adenomatous (pre-cancerous) polyps, you will need a repeat colonoscopy in 5 years.  Otherwise you should continue to follow colorectal cancer screening guidelines for "routine risk" patients with colonoscopy in 10 years.  You will receive a letter within 1-2 weeks with the results of your biopsy as well as final recommendations.  Please call my office if you have not received a letter after 3 weeks.   eSigned:  Inda Castle, MD 09/29/2013 2:08 PM   cc: Biagio Borg, MD   PATIENT NAME:  Molly Lewis, Molly Lewis MR#: [id number]

## 2013-10-02 ENCOUNTER — Telehealth: Payer: Self-pay | Admitting: *Deleted

## 2013-10-02 NOTE — Telephone Encounter (Signed)
  Follow up Call-  Call back number 09/29/2013  Post procedure Call Back phone  # [phone]  Permission to leave phone message Yes     Patient questions:  Do you have a fever, pain , or abdominal swelling? no Pain Score  0 *  Have you tolerated food without any problems? yes  Have you been able to return to your normal activities? yes  Do you have any questions about your discharge instructions: Diet   no Medications  no Follow up visit  no  Do you have questions or concerns about your Care? no  Actions: * If pain score is 4 or above: No action needed, pain <4.

## 2013-10-03 ENCOUNTER — Encounter: Payer: BC Managed Care – PPO | Admitting: Gastroenterology

## 2013-10-05 ENCOUNTER — Encounter: Payer: Self-pay | Admitting: Gastroenterology

## 2013-11-03 ENCOUNTER — Other Ambulatory Visit: Payer: Self-pay | Admitting: Dermatology

## 2013-12-12 ENCOUNTER — Other Ambulatory Visit: Payer: Self-pay

## 2013-12-12 MED ORDER — OMEPRAZOLE 20 MG PO CPDR: 20.0000 mg | DELAYED_RELEASE_CAPSULE | Freq: Two times a day (BID) | ORAL | Status: DC

## 2014-01-11 ENCOUNTER — Other Ambulatory Visit: Payer: Self-pay

## 2014-01-11 MED ORDER — ALPRAZOLAM 1 MG PO TABS: 1.0000 mg | ORAL_TABLET | Freq: Two times a day (BID) | ORAL | Status: DC | PRN

## 2014-01-11 NOTE — Telephone Encounter (Signed)
Done hardcopy to robin  

## 2014-01-11 NOTE — Telephone Encounter (Signed)
Faxed hardcopy to [facility]

## 2014-02-09 ENCOUNTER — Ambulatory Visit (INDEPENDENT_AMBULATORY_CARE_PROVIDER_SITE_OTHER): Payer: BC Managed Care – PPO | Admitting: Family Medicine

## 2014-02-09 VITALS — BP 126/80 | HR 83 | Temp 98.4°F | Resp 17 | Ht 65.0 in | Wt 163.0 lb

## 2014-02-09 DIAGNOSIS — H6120 Impacted cerumen, unspecified ear: Secondary | ICD-10-CM

## 2014-02-09 NOTE — Patient Instructions (Signed)
You had an ear wax blockage today- now resolved.  Let me know if you continue to have any difficulty with your ear!

## 2014-02-09 NOTE — Progress Notes (Signed)
[facility] [address], [place] [address] [phone]  Date:  02/09/2014   Name:  Molly Lewis   DOB:  03/23/1951   MRN:  [id number]  PCP:  Cathlean Cower, MD    Chief Complaint: Otalgia   History of Present Illness:  Molly Lewis is a 63 y.o. very pleasant female patient who presents with the following:  She is here today with a problem with her right ear.  She got some water in her ear yesterday and cannot seem to get it out.  She was just taking a shower.  She does note reduced hearing in the right ear.  The left ear is ok.  Otherwise she is feeling well, no fever or cough.   Patient Active Problem List   Diagnosis Date Noted  . Bilateral plantar fasciitis 07/23/2013  . Right lateral epicondylitis 07/23/2013  . Allergic rhinitis, cause unspecified 07/18/2011  . Encounter for long-term (current) use of high-risk medication 07/17/2011  . Preventative health care 02/16/2011  . Impaired glucose tolerance 02/16/2011  . INSOMNIA-SLEEP DISORDER-UNSPEC 06/26/2009  . INTERMITTENT VERTIGO 05/20/2009  . OSTEOPENIA 07/11/2008  . Obesity, unspecified 06/27/2008  . MENOPAUSAL DISORDER 06/27/2008  . COLONIC POLYPS, HX OF 06/27/2008  . LIVER FUNCTION TESTS, ABNORMAL 08/30/2007  . HYPERLIPIDEMIA 06/23/2007  . ANXIETY 06/23/2007  . DEPRESSION 06/23/2007  . COMMON MIGRAINE 06/23/2007  . GERD 06/23/2007  . Irritable bowel syndrome 06/23/2007  . OSTEOARTHRITIS, LUMBAR SPINE 06/23/2007  . LOW BACK PAIN 06/23/2007  . BREAST BIOPSY, HX OF 06/23/2007    Past Medical History  Diagnosis Date  . Impaired glucose tolerance 02/16/2011  . ANXIETY 06/23/2007  . DEPRESSION 06/23/2007  . GERD 06/23/2007  . HYPERLIPIDEMIA 06/23/2007  . BREAST BIOPSY, HX OF 06/23/2007    benign  . COLONIC POLYPS, HX OF 06/27/2008  . COMMON MIGRAINE 06/23/2007  . INSOMNIA-SLEEP DISORDER-UNSPEC 06/26/2009  . Irritable bowel syndrome 06/23/2007  . OSTEOARTHRITIS, LUMBAR SPINE  06/23/2007  . OSTEOPENIA 07/11/2008  . Chronic constipation   . Allergic rhinitis, cause unspecified 07/18/2011  . Cataract     Past Surgical History  Procedure Laterality Date  . Total abdominal hysterectomy    . Appendectomy    . Cataract extraction      BIL    History  Substance Use Topics  . Smoking status: Former Smoker    Types: Cigarettes    Quit date: 04/16/2009  . Smokeless tobacco: Never Used     Comment: Quit smoking 04/15/09  . Alcohol Use: No     Comment: rare,occasional    Family History  Problem Relation Age of Onset  . Heart disease Father     MI in early 39's  . Hypertension Father   . Brain cancer Mother   . Brain cancer Maternal Grandmother     Allergies  Allergen Reactions  . Codeine     REACTION: violentl nausea and vomiting    Medication list has been reviewed and updated.  Current Outpatient Prescriptions on File Prior to Visit  Medication Sig Dispense Refill  . ALPRAZolam (XANAX) 1 MG tablet Take 1 tablet (1 mg total) by mouth 2 (two) times daily as needed.  60 tablet  5  . aspirin 81 MG tablet Take 81 mg by mouth daily.      Marland Kitchen atorvastatin (LIPITOR) 40 MG tablet TAKE 1 TABLET DAILY  90 tablet  2  . Calcium Carbonate-Vit D-Min (CALCIUM 1200) 1200-1000 MG-UNIT CHEW Chew by mouth daily.        Marland Kitchen  fexofenadine (ALLEGRA) 180 MG tablet Take 1 tablet (180 mg total) by mouth daily.  90 tablet  3  . Multiple Vitamin (MULTIVITAMIN) tablet Take 1 tablet by mouth daily.      Marland Kitchen omeprazole (PRILOSEC) 20 MG capsule Take 1 capsule (20 mg total) by mouth 2 (two) times daily.  180 capsule  1   No current facility-administered medications on file prior to visit.    Review of Systems:  As per HPI- otherwise negative.   Physical Examination: Filed Vitals:   02/09/14 1644  BP: 126/80  Pulse: 83  Temp: 98.4 F (36.9 C)  Resp: 17   Filed Vitals:   02/09/14 1644  Height: 5\' 5"  (1.651 m)  Weight: 163 lb (73.936 kg)   Body mass index is 27.12  kg/(m^2). Ideal Body Weight: Weight in (lb) to have BMI = 25: 149.9   GEN: WDWN, NAD, Non-toxic, A & O x 3 HEENT: Atraumatic, Normocephalic. Neck supple. No masses, No LAD. Ears and Nose: No external deformity. CV: RRR, No M/G/R. No JVD. No thrill. No extra heart sounds. PULM: CTA B, no wheezes, crackles, rhonchi. No retractions. No resp. distress. No accessory muscle use. EXTR: No c/c/e NEURO Normal gait.  PSYCH: Normally interactive. Conversant. Not depressed or anxious appearing.  Calm demeanor.  She has a cerumen impaction of the right ear only.  Left ear is normal  Irrigated and removed wax, and TM and canal appeared normal  Assessment and Plan: Cerumen impaction  Resolved with irrigation.  Follow-up as needed   Signed Lamar Blinks, MD

## 2014-02-23 ENCOUNTER — Other Ambulatory Visit: Payer: Self-pay

## 2014-02-23 MED ORDER — ATORVASTATIN CALCIUM 40 MG PO TABS: 40.0000 mg | ORAL_TABLET | Freq: Every day | ORAL | Status: DC

## 2014-07-26 ENCOUNTER — Other Ambulatory Visit: Payer: BC Managed Care – PPO

## 2014-07-27 ENCOUNTER — Encounter: Payer: Self-pay | Admitting: Internal Medicine

## 2014-07-27 ENCOUNTER — Other Ambulatory Visit (INDEPENDENT_AMBULATORY_CARE_PROVIDER_SITE_OTHER): Payer: BC Managed Care – PPO

## 2014-07-27 ENCOUNTER — Ambulatory Visit (INDEPENDENT_AMBULATORY_CARE_PROVIDER_SITE_OTHER): Payer: BC Managed Care – PPO | Admitting: Internal Medicine

## 2014-07-27 VITALS — BP 142/82 | HR 83 | Temp 98.3°F | Ht 64.0 in | Wt 168.4 lb

## 2014-07-27 DIAGNOSIS — Z0000 Encounter for general adult medical examination without abnormal findings: Secondary | ICD-10-CM

## 2014-07-27 DIAGNOSIS — Z23 Encounter for immunization: Secondary | ICD-10-CM

## 2014-07-27 LAB — BASIC METABOLIC PANEL
BUN: 14 mg/dL (ref 6–23)
CO2: 29 mEq/L (ref 19–32)
Calcium: 9.4 mg/dL (ref 8.4–10.5)
Chloride: 106 mEq/L (ref 96–112)
Creatinine, Ser: 0.8 mg/dL (ref 0.4–1.2)
GFR: 73.13 mL/min (ref >60.00)
GLUCOSE: 103 mg/dL — AB (ref 70–99)
POTASSIUM: 4.5 meq/L (ref 3.5–5.1)
Sodium: 140 mEq/L (ref 135–145)

## 2014-07-27 LAB — CBC WITH DIFFERENTIAL/PLATELET
BASOS PCT: 0.4 % (ref 0.0–3.0)
Basophils Absolute: 0 10*3/uL (ref 0.0–0.1)
EOS ABS: 0.1 10*3/uL (ref 0.0–0.7)
Eosinophils Relative: 1.5 % (ref 0.0–5.0)
HCT: 42.4 % (ref 36.0–46.0)
Hemoglobin: 14.2 g/dL (ref 12.0–15.0)
LYMPHS ABS: 2.3 10*3/uL (ref 0.7–4.0)
LYMPHS PCT: 39.2 % (ref 12.0–46.0)
MCHC: 33.5 g/dL (ref 30.0–36.0)
MCV: 86.3 fl (ref 78.0–100.0)
Monocytes Absolute: 0.4 10*3/uL (ref 0.1–1.0)
Monocytes Relative: 7.4 % (ref 3.0–12.0)
NEUTROS ABS: 3 10*3/uL (ref 1.4–7.7)
Neutrophils Relative %: 51.5 % (ref 43.0–77.0)
Platelets: 167 10*3/uL (ref 150.0–400.0)
RBC: 4.91 Mil/uL (ref 3.87–5.11)
RDW: 13.5 % (ref 11.5–15.5)
WBC: 5.8 10*3/uL (ref 4.0–10.5)

## 2014-07-27 LAB — LIPID PANEL
Cholesterol: 176 mg/dL (ref 0–200)
HDL: 53.7 mg/dL (ref >39.00)
LDL Cholesterol: 90 mg/dL (ref 0–99)
NonHDL: 122.3
Total CHOL/HDL Ratio: 3
Triglycerides: 160 mg/dL — ABNORMAL HIGH (ref 0.0–149.0)
VLDL: 32 mg/dL (ref 0.0–40.0)

## 2014-07-27 LAB — URINALYSIS, ROUTINE W REFLEX MICROSCOPIC
Bilirubin Urine: NEGATIVE
KETONES UR: NEGATIVE
Leukocytes, UA: NEGATIVE
Nitrite: NEGATIVE
Specific Gravity, Urine: 1.025 (ref 1.000–1.030)
TOTAL PROTEIN, URINE-UPE24: NEGATIVE
URINE GLUCOSE: NEGATIVE
Urobilinogen, UA: 0.2 (ref 0.0–1.0)
pH: 5.5 (ref 5.0–8.0)

## 2014-07-27 LAB — HEPATIC FUNCTION PANEL
ALT: 41 U/L — ABNORMAL HIGH (ref 0–35)
AST: 32 U/L (ref 0–37)
Albumin: 4.3 g/dL (ref 3.5–5.2)
Alkaline Phosphatase: 103 U/L (ref 39–117)
BILIRUBIN DIRECT: 0.1 mg/dL (ref 0.0–0.3)
TOTAL PROTEIN: 7.6 g/dL (ref 6.0–8.3)
Total Bilirubin: 0.6 mg/dL (ref 0.2–1.2)

## 2014-07-27 LAB — TSH: TSH: 1.15 u[IU]/mL (ref 0.35–4.50)

## 2014-07-27 MED ORDER — ALPRAZOLAM 1 MG PO TABS
1.0000 mg | ORAL_TABLET | Freq: Two times a day (BID) | ORAL | Status: DC | PRN
Start: 2014-07-27 — End: 2015-03-21

## 2014-07-27 MED ORDER — OMEPRAZOLE 20 MG PO CPDR: 20.0000 mg | DELAYED_RELEASE_CAPSULE | Freq: Two times a day (BID) | ORAL | Status: DC

## 2014-07-27 MED ORDER — ATORVASTATIN CALCIUM 40 MG PO TABS: 40.0000 mg | ORAL_TABLET | Freq: Every day | ORAL | Status: DC

## 2014-07-27 NOTE — Assessment & Plan Note (Signed)

## 2014-07-27 NOTE — Patient Instructions (Addendum)

## 2014-07-27 NOTE — Addendum Note (Signed)
Addended by: Biagio Borg on: 07/27/2014 09:23 AM   Modules accepted: Orders, SmartSet

## 2014-07-27 NOTE — Progress Notes (Signed)
Pre visit review using our clinic review tool, if applicable. No additional management support is needed unless otherwise documented below in the visit note. 

## 2014-07-27 NOTE — Progress Notes (Signed)
Subjective:    Patient ID: Molly Lewis, female    DOB: 1951/05/23, 63 y.o.   MRN: [id number]  HPI  Here for wellness and f/u;  Overall doing ok;  Pt denies CP, worsening SOB, DOE, wheezing, orthopnea, PND, worsening LE edema, palpitations, dizziness or syncope.  Pt denies neurological change such as new headache, facial or extremity weakness.  Pt denies polydipsia, polyuria, or low sugar symptoms. Pt states overall good compliance with treatment and medications, good tolerability, and has been trying to follow lower cholesterol diet.  Pt denies worsening depressive symptoms, suicidal ideation or panic. No fever, night sweats, wt loss, loss of appetite, or other constitutional symptoms.  Pt states good ability with ADL's, has low fall risk, home safety reviewed and adequate, no other significant changes in hearing or vision, and active with exercise with gym about 3-4 times per wk. No current complaints except has some hand aching with OA joints after increased use sometimes with her hands Past Medical History  Diagnosis Date  . Impaired glucose tolerance 02/16/2011  . ANXIETY 06/23/2007  . DEPRESSION 06/23/2007  . GERD 06/23/2007  . HYPERLIPIDEMIA 06/23/2007  . BREAST BIOPSY, HX OF 06/23/2007    benign  . COLONIC POLYPS, HX OF 06/27/2008  . COMMON MIGRAINE 06/23/2007  . INSOMNIA-SLEEP DISORDER-UNSPEC 06/26/2009  . Irritable bowel syndrome 06/23/2007  . OSTEOARTHRITIS, LUMBAR SPINE 06/23/2007  . OSTEOPENIA 07/11/2008  . Chronic constipation   . Allergic rhinitis, cause unspecified 07/18/2011  . Cataract    Past Surgical History  Procedure Laterality Date  . Total abdominal hysterectomy    . Appendectomy    . Cataract extraction      BIL    reports that she quit smoking about 5 years ago. Her smoking use included Cigarettes. She smoked 0.00 packs per day. She has never used smokeless tobacco. She reports that she does not drink alcohol or use illicit drugs. family history includes  Brain cancer in her maternal grandmother and mother; Heart disease in her father; Hypertension in her father. Allergies  Allergen Reactions  . Codeine     REACTION: violentl nausea and vomiting   Current Outpatient Prescriptions on File Prior to Visit  Medication Sig Dispense Refill  . ALPRAZolam (XANAX) 1 MG tablet Take 1 tablet (1 mg total) by mouth 2 (two) times daily as needed. 60 tablet 5  . aspirin 81 MG tablet Take 81 mg by mouth daily.    . Calcium Carbonate-Vit D-Min (CALCIUM 1200) 1200-1000 MG-UNIT CHEW Chew by mouth daily.      . Multiple Vitamin (MULTIVITAMIN) tablet Take 1 tablet by mouth daily.    . fexofenadine (ALLEGRA) 180 MG tablet Take 1 tablet (180 mg total) by mouth daily. 90 tablet 3   No current facility-administered medications on file prior to visit.   Review of Systems  Constitutional: Negative for increased diaphoresis, other activity, appetite or other siginficant weight change  HENT: Negative for worsening hearing loss, ear pain, facial swelling, mouth sores and neck stiffness.   Eyes: Negative for other worsening pain, redness or visual disturbance.  Respiratory: Negative for shortness of breath and wheezing.   Cardiovascular: Negative for chest pain and palpitations.  Gastrointestinal: Negative for diarrhea, blood in stool, abdominal distention or other pain Genitourinary: Negative for hematuria, flank pain or change in urine volume.  Musculoskeletal: Negative for myalgias or other joint complaints.  Skin: Negative for color change and wound.  Neurological: Negative for syncope and numbness. other than noted Hematological: Negative for  adenopathy. or other swelling Psychiatric/Behavioral: Negative for hallucinations, self-injury, decreased concentration or other worsening agitation.      Objective:   Physical Exam BP 142/82 mmHg  Pulse 83  Temp(Src) 98.3 F (36.8 C) (Oral)  Ht 5\' 4"  (1.626 m)  Wt 168 lb 6 oz (76.374 kg)  BMI 28.89 kg/m2  SpO2  99% VS noted,  Constitutional: Pt is oriented to person, place, and time. Appears well-developed and well-nourished.  Head: Normocephalic and atraumatic.  Right Ear: External ear normal.  Left Ear: External ear normal.  Nose: Nose normal.  Mouth/Throat: Oropharynx is clear and moist.  Eyes: Conjunctivae and EOM are normal. Pupils are equal, round, and reactive to light.  Neck: Normal range of motion. Neck supple. No JVD present. No tracheal deviation present.  Cardiovascular: Normal rate, regular rhythm, normal heart sounds and intact distal pulses.   Pulmonary/Chest: Effort normal and breath sounds without rales or wheezing  Abdominal: Soft. Bowel sounds are normal. NT. No HSM  Musculoskeletal: Normal range of motion. Exhibits no edema.  Lymphadenopathy:  Has no cervical adenopathy.  Neurological: Pt is alert and oriented to person, place, and time. Pt has normal reflexes. No cranial nerve deficit. Motor grossly intact Skin: Skin is warm and dry. No rash noted.  Psychiatric:  Has normal mood and affect. Behavior is normal.   Wt Readings from Last 3 Encounters:  07/27/14 168 lb 6 oz (76.374 kg)  02/09/14 163 lb (73.936 kg)  09/29/13 164 lb (74.39 kg)       Assessment & Plan:

## 2015-02-06 ENCOUNTER — Ambulatory Visit (INDEPENDENT_AMBULATORY_CARE_PROVIDER_SITE_OTHER)
Admission: RE | Admit: 2015-02-06 | Discharge: 2015-02-06 | Disposition: A | Discharge status: Home / Self Care | Payer: BLUE CROSS/BLUE SHIELD | Source: Ambulatory Visit | Attending: Family | Admitting: Family

## 2015-02-06 ENCOUNTER — Encounter: Payer: Self-pay | Admitting: Family

## 2015-02-06 ENCOUNTER — Telehealth: Payer: Self-pay | Admitting: Geriatric Medicine

## 2015-02-06 ENCOUNTER — Ambulatory Visit (INDEPENDENT_AMBULATORY_CARE_PROVIDER_SITE_OTHER): Payer: BLUE CROSS/BLUE SHIELD | Admitting: Family

## 2015-02-06 VITALS — BP 144/96 | HR 75 | Temp 97.8°F | Resp 18 | Ht 65.0 in | Wt 158.0 lb

## 2015-02-06 DIAGNOSIS — M25561 Pain in right knee: Secondary | ICD-10-CM | POA: Diagnosis not present

## 2015-02-06 DIAGNOSIS — M25562 Pain in left knee: Secondary | ICD-10-CM | POA: Diagnosis not present

## 2015-02-06 NOTE — Progress Notes (Signed)
Pre visit review using our clinic review tool, if applicable. No additional management support is needed unless otherwise documented below in the visit note. 

## 2015-02-06 NOTE — Assessment & Plan Note (Signed)
Symptoms and exam consistent with knee contusion. Continue conservative treatment with heat/ice as needed as well as ibuprofen. Follow up if symptoms worsen or fail to improve.

## 2015-02-06 NOTE — Patient Instructions (Signed)
Thank you for choosing [facility].  Summary/Instructions:  Please stop by radiology on the basement level of the building for your x-rays. Your results will be released to [facility] (or called to you) after review, usually within 72 hours after test completion. If any treatments or changes are necessary, you will be notified at that same time.  If your symptoms worsen or fail to improve, please contact our office for further instruction, or in case of emergency go directly to the emergency room at the closest medical facility.   Please ice/heat your knee 2-3 times per day for approximately 20 minutes. Continue to take the ibuprofen as needed for pain up to 800 mg 3 times per day. Avoid painful positions.

## 2015-02-06 NOTE — Assessment & Plan Note (Signed)
Left knee pain of undetermined source. Obtain x-ray to rule out fracture or osteochondral defect. Not likely to be meniscus, question possible plica or capsulitis. Continue with conservative treatment of ice and heat and over the counter anti-inflammatories as needed. If symptoms do not improve will refer to Sports Medicine.

## 2015-02-06 NOTE — Telephone Encounter (Signed)
Please call patient. She returned your call and she says she has had a mammogram recently, but she thought you would be calling about the x-ray she had on her knee today.

## 2015-02-06 NOTE — Progress Notes (Signed)
Subjective:    Patient ID: Molly Lewis, female    DOB: [birth year], 64 y.o.   MRN: [id number]  Chief Complaint  Patient presents with  . Fall    fell on saturday out of the shower, fell and landed on her right side, her right knee is bothering her there is no bruising but she feels like it is deep down inside, her left knee is also bothering her, states that that one hurts the most, when turning it a certain way she feels like something inside is poking the skin like a knife    HPI:  Molly Lewis is a 64 y.o. female with a PMH of migraine, GERD, osteopenia, hyperlipidemia, anxiety, and depression who presents today for an acute office visit.  This is a new problem associated symptom of pain located in her bilateral knees started approximately 4 days ago when she fell getting out of the shower. She landed on her right side and denies hitting her head or LOC. Has a previous history of bilateral knee pain. Currently the left side is worse than the right. Describes the pain in her left knee as sharp, like being stabbed with a knife with a severity of 9/10. Describes the right knee pain as dull and achy with a severity of 5/10. Modifying factors include ibuprofen which does not really help with the pain. Functionally she is able to walk with minimal problem, it is only certain positions that exacerbate her pain.   Allergies  Allergen Reactions  . Codeine     REACTION: violentl nausea and vomiting    Current Outpatient Prescriptions on File Prior to Visit  Medication Sig Dispense Refill  . ALPRAZolam (XANAX) 1 MG tablet Take 1 tablet (1 mg total) by mouth 2 (two) times daily as needed. 60 tablet 5  . aspirin 81 MG tablet Take 81 mg by mouth daily.    Marland Kitchen atorvastatin (LIPITOR) 40 MG tablet Take 1 tablet (40 mg total) by mouth daily at 6 PM. 90 tablet 3  . Calcium Carbonate-Vit D-Min (CALCIUM 1200) 1200-1000 MG-UNIT CHEW Chew by mouth daily.      . Multiple Vitamin (MULTIVITAMIN) tablet  Take 1 tablet by mouth daily.    Marland Kitchen omeprazole (PRILOSEC) 20 MG capsule Take 1 capsule (20 mg total) by mouth 2 (two) times daily. 180 capsule 3  . fexofenadine (ALLEGRA) 180 MG tablet Take 1 tablet (180 mg total) by mouth daily. 90 tablet 3   No current facility-administered medications on file prior to visit.    Past Medical History  Diagnosis Date  . Impaired glucose tolerance 02/16/2011  . ANXIETY 06/23/2007  . DEPRESSION 06/23/2007  . GERD 06/23/2007  . HYPERLIPIDEMIA 06/23/2007  . BREAST BIOPSY, HX OF 06/23/2007    benign  . COLONIC POLYPS, HX OF 06/27/2008  . COMMON MIGRAINE 06/23/2007  . INSOMNIA-SLEEP DISORDER-UNSPEC 06/26/2009  . Irritable bowel syndrome 06/23/2007  . OSTEOARTHRITIS, LUMBAR SPINE 06/23/2007  . OSTEOPENIA 07/11/2008  . Chronic constipation   . Allergic rhinitis, cause unspecified 07/18/2011  . Cataract     Review of Systems  Musculoskeletal: Positive for arthralgias (Bilateral knee pain).  Neurological: Positive for numbness (Small spot on left knee). Negative for headaches.      Objective:    BP 144/96 mmHg  Pulse 75  Temp(Src) 97.8 F (36.6 C) (Oral)  Resp 18  Ht 5\' 5"  (1.651 m)  Wt 158 lb (71.668 kg)  BMI 26.29 kg/m2  SpO2 97% Nursing note and vital signs  reviewed.  Physical Exam  Constitutional: She is oriented to person, place, and time. She appears well-developed and well-nourished. No distress.  Cardiovascular: Normal rate, regular rhythm, normal heart sounds and intact distal pulses.   Pulmonary/Chest: Effort normal and breath sounds normal.  Musculoskeletal:  Right knee: No obvious deformity, discoloration or edema noted. No palpable tenderness able to be elicited. AROM and PROM are full and free from pain. Strength is normal. Ligamentous and meniscal testing is negative.  Left knee: No obvious deformity, discoloration or edema noted. No palpable tenderness able to be elicited. Area of numbness on the lateral aspect of the knee  superior to the lateral joint line. AROM and PROM are full and free from pain. Strength is normal. Ligamentous and meniscal testing are negative or do not reproduce symptoms.   Neurological: She is alert and oriented to person, place, and time.  Skin: Skin is warm and dry.  Psychiatric: She has a normal mood and affect. Her behavior is normal. Judgment and thought content normal.       Assessment & Plan:   Problem List Items Addressed This Visit      Other   Right knee pain - Primary    Symptoms and exam consistent with knee contusion. Continue conservative treatment with heat/ice as needed as well as ibuprofen. Follow up if symptoms worsen or fail to improve.       Left knee pain    Left knee pain of undetermined source. Obtain x-ray to rule out fracture or osteochondral defect. Not likely to be meniscus, question possible plica or capsulitis. Continue with conservative treatment of ice and heat and over the counter anti-inflammatories as needed. If symptoms do not improve will refer to Sports Medicine.       Relevant Orders   DG Knee Complete 4 Views Left

## 2015-02-06 NOTE — Telephone Encounter (Signed)
Left message asking patient to call me to let me know if she has had a mammogram recently.

## 2015-03-20 ENCOUNTER — Telehealth: Payer: Self-pay | Admitting: Internal Medicine

## 2015-03-20 MED ORDER — OMEPRAZOLE 20 MG PO CPDR: 20.0000 mg | DELAYED_RELEASE_CAPSULE | Freq: Two times a day (BID) | ORAL | Status: DC

## 2015-03-20 NOTE — Telephone Encounter (Signed)
Prilosec done, please advise on Xanax refill in PCP's absence, thanks!

## 2015-03-20 NOTE — Telephone Encounter (Signed)
Pt called in and needs a refill on her Prilosec and her Xanax. She would like it sent to her mail order

## 2015-03-21 MED ORDER — ALPRAZOLAM 1 MG PO TABS: 1.0000 mg | ORAL_TABLET | Freq: Two times a day (BID) | ORAL | Status: DC | PRN

## 2015-03-21 NOTE — Telephone Encounter (Signed)
Medication refilled and printed.  

## 2015-03-21 NOTE — Telephone Encounter (Signed)
Rx faxed to pharmacy  

## 2015-03-27 ENCOUNTER — Telehealth: Payer: Self-pay

## 2015-03-27 NOTE — Telephone Encounter (Signed)
Pa initiated via covermymeds. 7/20 KEY: [id number] Script: Omeprazole cap 20mg 

## 2015-03-28 NOTE — Telephone Encounter (Signed)
New form completed for PA. KEY: [id number]

## 2015-04-01 NOTE — Telephone Encounter (Signed)
PA approved. Pharmacy notified 

## 2015-06-05 ENCOUNTER — Other Ambulatory Visit: Payer: Self-pay

## 2015-06-05 MED ORDER — ALPRAZOLAM 1 MG PO TABS: 1.0000 mg | ORAL_TABLET | Freq: Two times a day (BID) | ORAL | Status: DC | PRN

## 2015-06-05 NOTE — Telephone Encounter (Signed)
Rx faxed to pharmacy  

## 2015-06-05 NOTE — Telephone Encounter (Signed)
Done hardcopy to Dahlia  

## 2015-07-31 ENCOUNTER — Other Ambulatory Visit (INDEPENDENT_AMBULATORY_CARE_PROVIDER_SITE_OTHER): Payer: BLUE CROSS/BLUE SHIELD

## 2015-07-31 ENCOUNTER — Ambulatory Visit (INDEPENDENT_AMBULATORY_CARE_PROVIDER_SITE_OTHER): Payer: BLUE CROSS/BLUE SHIELD | Admitting: Internal Medicine

## 2015-07-31 ENCOUNTER — Encounter: Payer: Self-pay | Admitting: Internal Medicine

## 2015-07-31 VITALS — BP 146/84 | HR 78 | Temp 97.9°F | Ht 65.0 in | Wt 162.0 lb

## 2015-07-31 DIAGNOSIS — Z0000 Encounter for general adult medical examination without abnormal findings: Secondary | ICD-10-CM

## 2015-07-31 DIAGNOSIS — R3129 Other microscopic hematuria: Secondary | ICD-10-CM

## 2015-07-31 DIAGNOSIS — Z23 Encounter for immunization: Secondary | ICD-10-CM

## 2015-07-31 DIAGNOSIS — R7302 Impaired glucose tolerance (oral): Secondary | ICD-10-CM | POA: Diagnosis not present

## 2015-07-31 HISTORY — DX: Other microscopic hematuria: R31.29

## 2015-07-31 LAB — URINALYSIS, ROUTINE W REFLEX MICROSCOPIC
Bilirubin Urine: NEGATIVE
KETONES UR: NEGATIVE
Leukocytes, UA: NEGATIVE
Nitrite: NEGATIVE
Specific Gravity, Urine: 1.025 (ref 1.000–1.030)
TOTAL PROTEIN, URINE-UPE24: NEGATIVE
Urine Glucose: NEGATIVE
Urobilinogen, UA: 0.2 (ref 0.0–1.0)
pH: 5.5 (ref 5.0–8.0)

## 2015-07-31 LAB — HEPATIC FUNCTION PANEL
ALBUMIN: 4.4 g/dL (ref 3.5–5.2)
ALK PHOS: 105 U/L (ref 39–117)
ALT: 22 U/L (ref 0–35)
AST: 19 U/L (ref 0–37)
Bilirubin, Direct: 0.1 mg/dL (ref 0.0–0.3)
Total Bilirubin: 0.4 mg/dL (ref 0.2–1.2)
Total Protein: 7.5 g/dL (ref 6.0–8.3)

## 2015-07-31 LAB — CBC WITH DIFFERENTIAL/PLATELET
Basophils Absolute: 0 10*3/uL (ref 0.0–0.1)
Basophils Relative: 0.4 % (ref 0.0–3.0)
EOS PCT: 1.4 % (ref 0.0–5.0)
Eosinophils Absolute: 0.1 10*3/uL (ref 0.0–0.7)
HCT: 44 % (ref 36.0–46.0)
Hemoglobin: 14.7 g/dL (ref 12.0–15.0)
Lymphocytes Relative: 37.7 % (ref 12.0–46.0)
Lymphs Abs: 2.6 10*3/uL (ref 0.7–4.0)
MCHC: 33.4 g/dL (ref 30.0–36.0)
MCV: 87.5 fl (ref 78.0–100.0)
Monocytes Absolute: 0.5 10*3/uL (ref 0.1–1.0)
Monocytes Relative: 7.2 % (ref 3.0–12.0)
Neutro Abs: 3.7 10*3/uL (ref 1.4–7.7)
Neutrophils Relative %: 53.3 % (ref 43.0–77.0)
Platelets: 188 10*3/uL (ref 150.0–400.0)
RBC: 5.04 Mil/uL (ref 3.87–5.11)
RDW: 13 % (ref 11.5–15.5)
WBC: 6.9 10*3/uL (ref 4.0–10.5)

## 2015-07-31 LAB — BASIC METABOLIC PANEL
BUN: 20 mg/dL (ref 6–23)
CHLORIDE: 102 meq/L (ref 96–112)
CO2: 28 mEq/L (ref 19–32)
Calcium: 10.4 mg/dL (ref 8.4–10.5)
Creatinine, Ser: 0.79 mg/dL (ref 0.40–1.20)
GFR: 78.24 mL/min (ref >60.00)
GLUCOSE: 112 mg/dL — AB (ref 70–99)
POTASSIUM: 4.7 meq/L (ref 3.5–5.1)
SODIUM: 141 meq/L (ref 135–145)

## 2015-07-31 LAB — LIPID PANEL
CHOLESTEROL: 189 mg/dL (ref 0–200)
HDL: 58.5 mg/dL (ref >39.00)
LDL Cholesterol: 96 mg/dL (ref 0–99)
NonHDL: 130.32
Total CHOL/HDL Ratio: 3
Triglycerides: 173 mg/dL — ABNORMAL HIGH (ref 0.0–149.0)
VLDL: 34.6 mg/dL (ref 0.0–40.0)

## 2015-07-31 LAB — TSH: TSH: 1.35 u[IU]/mL (ref 0.35–4.50)

## 2015-07-31 MED ORDER — OMEPRAZOLE 20 MG PO CPDR: 20.0000 mg | DELAYED_RELEASE_CAPSULE | Freq: Two times a day (BID) | ORAL | Status: DC

## 2015-07-31 MED ORDER — ATORVASTATIN CALCIUM 40 MG PO TABS: 40.0000 mg | ORAL_TABLET | Freq: Every day | ORAL | Status: DC

## 2015-07-31 NOTE — Progress Notes (Signed)
Subjective:    Patient ID: Molly Lewis, female    DOB: September 04, 1951, 64 y.o.   MRN: [id number]  HPI    Here for wellness and f/u;  Overall doing ok;  Pt denies Chest pain, worsening SOB, DOE, wheezing, orthopnea, PND, worsening LE edema, palpitations, dizziness or syncope.  Pt denies neurological change such as new headache, facial or extremity weakness.  Pt denies polydipsia, polyuria, or low sugar symptoms. Pt states overall good compliance with treatment and medications, good tolerability, and has been trying to follow appropriate diet.  Pt denies worsening depressive symptoms, suicidal ideation or panic. No fever, night sweats, wt loss, loss of appetite, or other constitutional symptoms.  Pt states good ability with ADL's, has low fall risk, home safety reviewed and adequate, no other significant changes in hearing or vision, and only occasionally active with exercise.  Still with chronic constipation, has not tried miralax. But has tried the stools softners.  Right knee pain improved, though still has the crunching to both knees from June 2009.  Wt has been up and down., tyirng to get to 150  BP elevated today persistent mild for the past yr now it seems.   BP Readings from Last 3 Encounters:  07/31/15 146/84  02/06/15 144/96  07/27/14 142/82   Wt Readings from Last 3 Encounters:  07/31/15 162 lb (73.483 kg)  02/06/15 158 lb (71.668 kg)  07/27/14 168 lb 6 oz (76.374 kg)   Past Medical History  Diagnosis Date  . Impaired glucose tolerance 02/16/2011  . ANXIETY 06/23/2007  . DEPRESSION 06/23/2007  . GERD 06/23/2007  . HYPERLIPIDEMIA 06/23/2007  . BREAST BIOPSY, HX OF 06/23/2007    benign  . COLONIC POLYPS, HX OF 06/27/2008  . COMMON MIGRAINE 06/23/2007  . INSOMNIA-SLEEP DISORDER-UNSPEC 06/26/2009  . Irritable bowel syndrome 06/23/2007  . OSTEOARTHRITIS, LUMBAR SPINE 06/23/2007  . OSTEOPENIA 07/11/2008  . Chronic constipation   . Allergic rhinitis, cause unspecified 07/18/2011  .  Cataract    Past Surgical History  Procedure Laterality Date  . Total abdominal hysterectomy    . Appendectomy    . Cataract extraction      BIL    reports that she quit smoking about 6 years ago. Her smoking use included Cigarettes. She has never used smokeless tobacco. She reports that she does not drink alcohol or use illicit drugs. family history includes Brain cancer in her maternal grandmother and mother; Heart disease in her father; Hypertension in her father. Allergies  Allergen Reactions  . Codeine     REACTION: violentl nausea and vomiting   Current Outpatient Prescriptions on File Prior to Visit  Medication Sig Dispense Refill  . ALPRAZolam (XANAX) 1 MG tablet Take 1 tablet (1 mg total) by mouth 2 (two) times daily as needed. 60 tablet 0  . aspirin 81 MG tablet Take 81 mg by mouth daily.    . Calcium Carbonate-Vit D-Min (CALCIUM 1200) 1200-1000 MG-UNIT CHEW Chew by mouth daily.      . Multiple Vitamin (MULTIVITAMIN) tablet Take 1 tablet by mouth daily.    . fexofenadine (ALLEGRA) 180 MG tablet Take 1 tablet (180 mg total) by mouth daily. 90 tablet 3   No current facility-administered medications on file prior to visit.    Review of Systems Constitutional: Negative for increased diaphoresis, other activity, appetite or siginficant weight change other than noted HENT: Negative for worsening hearing loss, ear pain, facial swelling, mouth sores and neck stiffness.   Eyes: Negative for other worsening  pain, redness or visual disturbance.  Respiratory: Negative for shortness of breath and wheezing  Cardiovascular: Negative for chest pain and palpitations.  Gastrointestinal: Negative for diarrhea, blood in stool, abdominal distention or other pain Genitourinary: Negative for hematuria, flank pain or change in urine volume.  Musculoskeletal: Negative for myalgias or other joint complaints.  Skin: Negative for color change and wound or drainage.  Neurological: Negative for  syncope and numbness. other than noted Hematological: Negative for adenopathy. or other swelling Psychiatric/Behavioral: Negative for hallucinations, SI, self-injury, decreased concentration or other worsening agitation.      Objective:   Physical Exam BP 146/84 mmHg  Pulse 78  Temp(Src) 97.9 F (36.6 C) (Oral)  Ht 5\' 5"  (1.651 m)  Wt 162 lb (73.483 kg)  BMI 26.96 kg/m2  SpO2 97% VS noted,  Constitutional: Pt is oriented to person, place, and time. Appears well-developed and well-nourished, in no significant distress Head: Normocephalic and atraumatic.  Right Ear: External ear normal.  Left Ear: External ear normal.  Nose: Nose normal.  Mouth/Throat: Oropharynx is clear and moist.  Eyes: Conjunctivae and EOM are normal. Pupils are equal, round, and reactive to light.  Neck: Normal range of motion. Neck supple. No JVD present. No tracheal deviation present or significant neck LA or mass Cardiovascular: Normal rate, regular rhythm, normal heart sounds and intact distal pulses.   Pulmonary/Chest: Effort normal and breath sounds without rales or wheezing  Abdominal: Soft. Bowel sounds are normal. NT. No HSM  Musculoskeletal: Normal range of motion. Exhibits no edema.  Lymphadenopathy:  Has no cervical adenopathy.  Neurological: Pt is alert and oriented to person, place, and time. Pt has normal reflexes. No cranial nerve deficit. Motor grossly intact Skin: Skin is warm and dry. No rash noted.  Psychiatric:  Has normal mood and affect. Behavior is normal.     Assessment & Plan:

## 2015-07-31 NOTE — Patient Instructions (Addendum)
Your EKG was OK today  Please continue all other medications as before, and refills have been done if requested.  Please have the pharmacy call with any other refills you may need.  Please continue your efforts at being more active, low cholesterol diet, and weight control.  You are otherwise up to date with prevention measures today.  Please keep your appointments with your specialists as you may have planned  Please go to the LAB in the Basement (turn left off the elevator) for the tests to be done today - the hep C testing  You will be contacted by phone if any changes need to be made immediately.  Otherwise, you will receive a letter about your results with an explanation, but please check with MyChart first.  Please remember to sign up for MyChart if you have not done so, as this will be important to you in the future with finding out test results, communicating by private email, and scheduling acute appointments online when needed.  Please return in 1 year for your yearly visit, or sooner if needed, with Lab testing done 3-5 days before

## 2015-07-31 NOTE — Assessment & Plan Note (Signed)
stable overall by history and exam, recent data reviewed with pt, and pt to continue medical treatment as before,  to f/u any worsening symptoms or concerns Lab Results  Component Value Date   HGBA1C 5.5 06/13/2012

## 2015-07-31 NOTE — Progress Notes (Signed)
Pre visit review using our clinic review tool, if applicable. No additional management support is needed unless otherwise documented below in the visit note. 

## 2015-07-31 NOTE — Assessment & Plan Note (Signed)

## 2015-08-01 LAB — HEPATITIS C ANTIBODY: HCV Ab: NEGATIVE

## 2015-08-08 ENCOUNTER — Encounter: Payer: Self-pay | Admitting: Gastroenterology

## 2015-08-22 LAB — HM MAMMOGRAPHY: HM Mammogram: NEGATIVE

## 2015-08-29 ENCOUNTER — Encounter: Payer: Self-pay | Admitting: Internal Medicine

## 2015-11-16 ENCOUNTER — Ambulatory Visit (INDEPENDENT_AMBULATORY_CARE_PROVIDER_SITE_OTHER): Payer: BLUE CROSS/BLUE SHIELD | Admitting: Family Medicine

## 2015-11-16 VITALS — BP 148/74 | HR 67 | Temp 98.4°F | Resp 16 | Ht 66.0 in | Wt 176.0 lb

## 2015-11-16 DIAGNOSIS — N10 Acute tubulo-interstitial nephritis: Secondary | ICD-10-CM

## 2015-11-16 DIAGNOSIS — M545 Low back pain: Secondary | ICD-10-CM | POA: Diagnosis not present

## 2015-11-16 DIAGNOSIS — J3481 Nasal mucositis (ulcerative): Secondary | ICD-10-CM | POA: Diagnosis not present

## 2015-11-16 LAB — POCT URINALYSIS DIP (MANUAL ENTRY)
BILIRUBIN UA: NEGATIVE
Bilirubin, UA: NEGATIVE
GLUCOSE UA: NEGATIVE
Nitrite, UA: NEGATIVE
PH UA: 5.5
Protein Ur, POC: NEGATIVE
RBC UA: NEGATIVE
Spec Grav, UA: <=1.005
Urobilinogen, UA: 0.2

## 2015-11-16 LAB — POC MICROSCOPIC URINALYSIS (UMFC): Mucus: ABSENT

## 2015-11-16 MED ORDER — AMOXICILLIN-POT CLAVULANATE 875-125 MG PO TABS: 1.0000 | ORAL_TABLET | Freq: Two times a day (BID) | ORAL | Status: DC

## 2015-11-16 MED ORDER — PHENAZOPYRIDINE HCL 200 MG PO TABS: 200.0000 mg | ORAL_TABLET | Freq: Three times a day (TID) | ORAL | Status: DC | PRN

## 2015-11-16 MED ORDER — MUPIROCIN 2 % EX OINT: 1.0000 "application " | TOPICAL_OINTMENT | Freq: Two times a day (BID) | CUTANEOUS | Status: DC

## 2015-11-16 NOTE — Progress Notes (Signed)
Subjective:  By signing my name below, I, Raven Small, attest that this documentation has been prepared under the direction and in the presence of Delman Cheadle, MD.  Electronically Signed: Thea Alken, ED Scribe. 11/16/2015. 10:28 AM.   Patient ID: Molly Lewis, female    DOB: 03-23-1951, 66 y.o.   MRN: [id number]  HPI Chief Complaint  Patient presents with  . Urinary Tract Infection    lower back pain, x 1 week  . Sinusitis    x 1 week    HPI Comments: Molly Lewis is a 65 y.o. female who presents to the [facility] complaining of aching low back pain. Pt assumes she has a UTI.  States the last time she had low back pain she had an infection in ovaries which resulted in a total hysterectomy. She reports associated dysuria after urination, cloudy urine and urinary frequency. Pt has not tried Azo or other OTC medications. Pt is normally constipated and denies changes in bowel. She denies hx of bowels and abscesses. No recent abx use.   Pt has also had some sinus congestion without drainage. She reports associated chills. She denies fever and cough.   Patient Active Problem List   Diagnosis Date Noted  . Microhematuria 07/31/2015  . Right knee pain 02/06/2015  . Left knee pain 02/06/2015  . Bilateral plantar fasciitis 07/23/2013  . Right lateral epicondylitis 07/23/2013  . Allergic rhinitis, cause unspecified 07/18/2011  . Encounter for long-term (current) use of high-risk medication 07/17/2011  . Preventative health care 02/16/2011  . Impaired glucose tolerance 02/16/2011  . INSOMNIA-SLEEP DISORDER-UNSPEC 06/26/2009  . INTERMITTENT VERTIGO 05/20/2009  . OSTEOPENIA 07/11/2008  . Obesity, unspecified 06/27/2008  . MENOPAUSAL DISORDER 06/27/2008  . COLONIC POLYPS, HX OF 06/27/2008  . LIVER FUNCTION TESTS, ABNORMAL 08/30/2007  . HYPERLIPIDEMIA 06/23/2007  . ANXIETY 06/23/2007  . DEPRESSION 06/23/2007  . COMMON MIGRAINE 06/23/2007  . GERD 06/23/2007  .  Irritable bowel syndrome 06/23/2007  . OSTEOARTHRITIS, LUMBAR SPINE 06/23/2007  . LOW BACK PAIN 06/23/2007  . BREAST BIOPSY, HX OF 06/23/2007   Past Medical History  Diagnosis Date  . Impaired glucose tolerance 02/16/2011  . ANXIETY 06/23/2007  . DEPRESSION 06/23/2007  . GERD 06/23/2007  . HYPERLIPIDEMIA 06/23/2007  . BREAST BIOPSY, HX OF 06/23/2007    benign  . COLONIC POLYPS, HX OF 06/27/2008  . COMMON MIGRAINE 06/23/2007  . INSOMNIA-SLEEP DISORDER-UNSPEC 06/26/2009  . Irritable bowel syndrome 06/23/2007  . OSTEOARTHRITIS, LUMBAR SPINE 06/23/2007  . OSTEOPENIA 07/11/2008  . Chronic constipation   . Allergic rhinitis, cause unspecified 07/18/2011  . Cataract   . Microhematuria 07/31/2015   Past Surgical History  Procedure Laterality Date  . Total abdominal hysterectomy    . Appendectomy    . Cataract extraction      BIL   Allergies  Allergen Reactions  . Codeine     REACTION: violentl nausea and vomiting   Prior to Admission medications   Medication Sig Start Date End Date Taking? Authorizing Provider  ALPRAZolam Duanne Moron) 1 MG tablet Take 1 tablet (1 mg total) by mouth 2 (two) times daily as needed. 06/05/15  Yes Biagio Borg, MD  aspirin 81 MG tablet Take 81 mg by mouth daily.   Yes Historical Provider, MD  atorvastatin (LIPITOR) 40 MG tablet Take 1 tablet (40 mg total) by mouth daily at 6 PM. 07/31/15  Yes Biagio Borg, MD  Calcium Carbonate-Vit D-Min (CALCIUM 1200) 1200-1000 MG-UNIT CHEW Chew by mouth  daily.     Yes Historical Provider, MD  Multiple Vitamin (MULTIVITAMIN) tablet Take 1 tablet by mouth daily.   Yes Historical Provider, MD  omeprazole (PRILOSEC) 20 MG capsule Take 1 capsule (20 mg total) by mouth 2 (two) times daily. 07/31/15  Yes Biagio Borg, MD  fexofenadine (ALLEGRA) 180 MG tablet Take 1 tablet (180 mg total) by mouth daily. 07/18/12 02/09/14  Biagio Borg, MD   Social History   Social History  . Marital Status: Married    Spouse Name: N/A  .  Number of Children: 2  . Years of Education: N/A   Occupational History  . Not on file.   Social History Main Topics  . Smoking status: Former Smoker    Types: Cigarettes    Quit date: 04/16/2009  . Smokeless tobacco: Never Used     Comment: Quit smoking 04/15/09  . Alcohol Use: No     Comment: rare,occasional  . Drug Use: No  . Sexual Activity: Yes   Other Topics Concern  . Not on file   Social History Narrative   Review of Systems  Constitutional: Positive for chills. Negative for fever.  HENT: Positive for congestion.   Respiratory: Negative for cough.   Gastrointestinal: Positive for abdominal pain and constipation. Negative for diarrhea.  Genitourinary: Positive for dysuria and frequency. Negative for difficulty urinating.  Musculoskeletal: Positive for back pain.   Objective:   Physical Exam  Constitutional: She is oriented to person, place, and time. She appears well-developed and well-nourished. No distress.  HENT:  Head: Normocephalic and atraumatic.  Bilateral nares with edema, erythema and some friability  Eyes: Conjunctivae and EOM are normal.  Neck: Neck supple.  Cardiovascular: Normal rate, regular rhythm and normal heart sounds.   No murmur heard. Pulmonary/Chest: Effort normal and breath sounds normal. No respiratory distress. She has no wheezes. She has no rales.  Abdominal: Bowel sounds are increased. There is tenderness ( mild) in the suprapubic area. There is no rebound and no guarding.  Musculoskeletal: Normal range of motion.  Right CVA tenderness.   Lymphadenopathy:    She has no cervical adenopathy.  Neurological: She is alert and oriented to person, place, and time.  Skin: Skin is warm and dry.  Psychiatric: She has a normal mood and affect. Her behavior is normal.  Nursing note and vitals reviewed.  Filed Vitals:   11/16/15 1014  BP: 148/74  Pulse: 67  Temp: 98.4 F (36.9 C)  Resp: 16  Height: 5\' 6"  (1.676 m)  Weight: 176 lb (79.833  kg)  SpO2: 95%   Results for orders placed or performed in visit on 11/16/15  POCT Microscopic Urinalysis ([facility])  Result Value Ref Range   WBC,UR,HPF,POC None None WBC/hpf   RBC,UR,HPF,POC None None RBC/hpf   Bacteria None None, Too numerous to count   Mucus Absent Absent   Epithelial Cells, UR Per Microscopy Few (A) None, Too numerous to count cells/hpf  POCT urinalysis dipstick  Result Value Ref Range   Color, UA yellow yellow   Clarity, UA cloudy (A) clear   Glucose, UA negative negative   Bilirubin, UA negative negative   Ketones, POC UA negative negative   Spec Grav, UA <=1.005    Blood, UA negative negative   pH, UA 5.5    Protein Ur, POC negative negative   Urobilinogen, UA 0.2    Nitrite, UA Negative Negative   Leukocytes, UA Trace (A) Negative    Assessment & Plan:  1. Low back pain, unspecified back pain laterality, with sciatica presence unspecified   2. Acute pyelonephritis   3. Nasal mucositis (ulcerative)   Augmentin to cover both GU sxs and sinus/URI sxs.  Orders Placed This Encounter  Procedures  . POCT Microscopic Urinalysis ([facility])  . POCT urinalysis dipstick    Meds ordered this encounter  Medications  . amoxicillin-clavulanate (AUGMENTIN) 875-125 MG tablet    Sig: Take 1 tablet by mouth 2 (two) times daily.    Dispense:  14 tablet    Refill:  0  . mupirocin ointment (BACTROBAN) 2 %    Sig: Apply 1 application topically 2 (two) times daily. intranasally    Dispense:  30 g    Refill:  0  . phenazopyridine (PYRIDIUM) 200 MG tablet    Sig: Take 1 tablet (200 mg total) by mouth 3 (three) times daily as needed for pain.    Dispense:  14 tablet    Refill:  0    I personally performed the services described in this documentation, which was scribed in my presence. The recorded information has been reviewed and considered, and addended by me as needed.  Delman Cheadle, MD MPH

## 2015-11-16 NOTE — Patient Instructions (Signed)

## 2016-01-07 ENCOUNTER — Other Ambulatory Visit: Payer: Self-pay

## 2016-01-07 MED ORDER — ALPRAZOLAM 1 MG PO TABS: 1.0000 mg | ORAL_TABLET | Freq: Two times a day (BID) | ORAL | Status: DC | PRN

## 2016-01-07 NOTE — Telephone Encounter (Signed)
Requesting refill for alprazolam. Please advise

## 2016-01-07 NOTE — Telephone Encounter (Signed)
Done hardcopy to Corinne  

## 2016-01-08 NOTE — Telephone Encounter (Signed)
Medication sent to pharmacy  

## 2016-01-30 ENCOUNTER — Telehealth: Payer: Self-pay | Admitting: Internal Medicine

## 2016-01-30 NOTE — Telephone Encounter (Signed)
Had to move patient cpe to December.  Please make sure labs will be good for that time.

## 2016-04-02 ENCOUNTER — Telehealth: Payer: Self-pay | Admitting: *Deleted

## 2016-04-02 NOTE — Telephone Encounter (Signed)
Rec'd fax pt requesting refill on her Alprazolam 90 day supply sent to [facility]... MD out of office pls advise...[name]

## 2016-04-02 NOTE — Telephone Encounter (Signed)
I can do a 30 day supply but not able to do a 90 day.

## 2016-04-06 MED ORDER — ALPRAZOLAM 1 MG PO TABS: 1.0000 mg | ORAL_TABLET | Freq: Two times a day (BID) | ORAL | 1 refills | Status: DC | PRN

## 2016-04-06 NOTE — Telephone Encounter (Signed)
Pt is wanting her 90 day will forward to MD to ok tomorrow when he returns...Molly Lewis

## 2016-04-06 NOTE — Telephone Encounter (Signed)
#  60 with a refill Is the best I can do due to controlled substance medication

## 2016-04-07 NOTE — Telephone Encounter (Signed)
Medication refill faxed to pharmacy

## 2016-05-01 ENCOUNTER — Emergency Department (HOSPITAL_COMMUNITY)
Admission: EM | Admit: 2016-05-01 | Discharge: 2016-05-01 | Disposition: A | Discharge status: Home / Self Care | Payer: BLUE CROSS/BLUE SHIELD | Attending: Emergency Medicine | Admitting: Emergency Medicine

## 2016-05-01 ENCOUNTER — Encounter (HOSPITAL_COMMUNITY): Payer: Self-pay

## 2016-05-01 DIAGNOSIS — Z7982 Long term (current) use of aspirin: Secondary | ICD-10-CM | POA: Diagnosis not present

## 2016-05-01 DIAGNOSIS — H538 Other visual disturbances: Secondary | ICD-10-CM | POA: Insufficient documentation

## 2016-05-01 DIAGNOSIS — Z87891 Personal history of nicotine dependence: Secondary | ICD-10-CM | POA: Diagnosis not present

## 2016-05-01 NOTE — ED Notes (Signed)
No answer at 1200

## 2016-05-01 NOTE — ED Triage Notes (Signed)
Pt reports she was seen by her opthamologist last week for blurred vision, she was told she has "floaters." Blurred vision has continued to get worse. Pt also reports mild headache increased with light.

## 2016-05-01 NOTE — ED Notes (Signed)
Resident at bedside.  

## 2016-05-01 NOTE — ED Notes (Signed)
Pt left hallway bed with out informing any staff member. Pt eloped.

## 2016-05-01 NOTE — ED Provider Notes (Signed)
[place] DEPT Provider Note   CSN: [id number] Arrival date & time: 05/01/16  1114   History   Chief Complaint Chief Complaint  Patient presents with  . Blurred Vision    HPI Molly Lewis is a 65 y.o. female.  The history is provided by the patient and medical records.   65 year old female with history of migraines, hyperlipidemia, arthritis, depression/anxiety, cataracts s/p bilat cataract surgery in past presenting with complaint of blurred vision. Onset about a week ago. However, worsening significantly over the last few days. Located in right eye only. Initially described as flickers and flashes of light, now described as nearly entire eye going black intermittently. Symptoms wax and wane. No associated pain in the eye, history of trauma, neurologic deficits. Patient states she was seen by her ophthalmologist a week ago when her symptoms were started and was told that she had "tears in the retina". She called her ophthalmologist with worsening of symptoms she has been experiencing but was unable to get an appointment so she was told to present here for evaluation. Today she has a mild associated headache and sensitivity to light, that was relieved by ibuprofen.   Past Medical History:  Diagnosis Date  . Allergic rhinitis, cause unspecified 07/18/2011  . ANXIETY 06/23/2007  . BREAST BIOPSY, HX OF 06/23/2007   benign  . Cataract   . Chronic constipation   . COLONIC POLYPS, HX OF 06/27/2008  . COMMON MIGRAINE 06/23/2007  . DEPRESSION 06/23/2007  . GERD 06/23/2007  . HYPERLIPIDEMIA 06/23/2007  . Impaired glucose tolerance 02/16/2011  . INSOMNIA-SLEEP DISORDER-UNSPEC 06/26/2009  . Irritable bowel syndrome 06/23/2007  . Microhematuria 07/31/2015  . OSTEOARTHRITIS, LUMBAR SPINE 06/23/2007  . OSTEOPENIA 07/11/2008    Patient Active Problem List   Diagnosis Date Noted  . Microhematuria 07/31/2015  . Right knee pain 02/06/2015  . Left knee pain 02/06/2015  . Bilateral  plantar fasciitis 07/23/2013  . Right lateral epicondylitis 07/23/2013  . Allergic rhinitis, cause unspecified 07/18/2011  . Encounter for long-term (current) use of high-risk medication 07/17/2011  . Preventative health care 02/16/2011  . Impaired glucose tolerance 02/16/2011  . INSOMNIA-SLEEP DISORDER-UNSPEC 06/26/2009  . INTERMITTENT VERTIGO 05/20/2009  . OSTEOPENIA 07/11/2008  . Obesity, unspecified 06/27/2008  . MENOPAUSAL DISORDER 06/27/2008  . COLONIC POLYPS, HX OF 06/27/2008  . LIVER FUNCTION TESTS, ABNORMAL 08/30/2007  . HYPERLIPIDEMIA 06/23/2007  . ANXIETY 06/23/2007  . DEPRESSION 06/23/2007  . COMMON MIGRAINE 06/23/2007  . GERD 06/23/2007  . Irritable bowel syndrome 06/23/2007  . OSTEOARTHRITIS, LUMBAR SPINE 06/23/2007  . LOW BACK PAIN 06/23/2007  . BREAST BIOPSY, HX OF 06/23/2007    Past Surgical History:  Procedure Laterality Date  . APPENDECTOMY    . CATARACT EXTRACTION     BIL  . TOTAL ABDOMINAL HYSTERECTOMY      OB History    No data available       Home Medications    Prior to Admission medications   Medication Sig Start Date End Date Taking? Authorizing Provider  ALPRAZolam Duanne Moron) 1 MG tablet Take 1 tablet (1 mg total) by mouth 2 (two) times daily as needed. Patient taking differently: Take 1 mg by mouth 2 (two) times daily as needed for anxiety.  04/06/16  Yes Biagio Borg, MD  aspirin EC 81 MG tablet Take 81 mg by mouth daily.   Yes Historical Provider, MD  atorvastatin (LIPITOR) 40 MG tablet Take 1 tablet (40 mg total) by mouth daily at 6 PM. Patient taking differently: Take 40  mg by mouth daily.  07/31/15  Yes Biagio Borg, MD  Calcium Carbonate-Vitamin D (CALCIUM-D PO) Take 1,200 mg by mouth daily.   Yes Historical Provider, MD  Fexofenadine HCl (ALLEGRA PO) Take 1 tablet by mouth daily as needed (allergies).   Yes Historical Provider, MD  Multiple Vitamin (MULTIVITAMIN WITH MINERALS) TABS tablet Take 1 tablet by mouth daily.   Yes Historical  Provider, MD  omeprazole (PRILOSEC) 20 MG capsule Take 1 capsule (20 mg total) by mouth 2 (two) times daily. Patient taking differently: Take 40 mg by mouth daily.  07/31/15  Yes Biagio Borg, MD    Family History Family History  Problem Relation Age of Onset  . Heart disease Father     MI in early 20's  . Hypertension Father   . Brain cancer Mother   . Brain cancer Maternal Grandmother     Social History Social History  Substance Use Topics  . Smoking status: Former Smoker    Types: Cigarettes    Quit date: 04/16/2009  . Smokeless tobacco: Never Used     Comment: Quit smoking 04/15/09  . Alcohol use No     Comment: rare,occasional     Allergies   Codeine   Review of Systems Review of Systems  Constitutional: Negative for chills and fever.  HENT: Negative for rhinorrhea.   Eyes: Positive for visual disturbance. Negative for pain and redness.  Respiratory: Negative for cough and shortness of breath.   Cardiovascular: Negative for chest pain and palpitations.  Gastrointestinal: Negative for abdominal pain and vomiting.  Musculoskeletal: Negative for neck pain.  Skin: Negative for rash.  Neurological: Positive for headaches. Negative for speech difficulty, weakness and numbness.  Hematological: Does not bruise/bleed easily.  Psychiatric/Behavioral: Negative for confusion.  All other systems reviewed and are negative.    Physical Exam Updated Vital Signs BP 158/90   Pulse 72   Temp 98.1 F (36.7 C) (Oral)   Resp 14   Ht 5\' 5"  (1.651 m)   Wt 72.6 kg   SpO2 98%   BMI 26.63 kg/m   Physical Exam  Constitutional: She is oriented to person, place, and time. She appears well-developed and well-nourished. No distress.  HENT:  Head: Normocephalic and atraumatic.  Eyes: Conjunctivae and EOM are normal. Pupils are equal, round, and reactive to light. Right eye exhibits no discharge. Left eye exhibits no discharge.  Neck: Neck supple.  Cardiovascular: Normal rate,  regular rhythm and normal heart sounds.   No murmur heard. Pulmonary/Chest: Effort normal and breath sounds normal. No respiratory distress.  Abdominal: Soft. There is no tenderness.  Musculoskeletal: She exhibits no edema.  Neurological: She is alert and oriented to person, place, and time. She has normal strength. She displays normal reflexes. No cranial nerve deficit or sensory deficit. Coordination and gait normal.  Skin: Skin is warm and dry.  Psychiatric: She has a normal mood and affect.  Nursing note and vitals reviewed.   ED Treatments / Results  Labs (all labs ordered are listed, but only abnormal results are displayed) Labs Reviewed - No data to display  EKG  EKG Interpretation None       Radiology No results found.  Procedures Procedures (including critical care time)  Medications Ordered in ED Medications - No data to display   Initial Impression / Assessment and Plan / ED Course  I have reviewed the triage vital signs and the nursing notes.  Pertinent labs & imaging results that were available during  my care of the patient were reviewed by me and considered in my medical decision making (see chart for details).  Clinical Course    65 year old female with history of migraines, hyperlipidemia, arthritis, depression/anxiety, cataracts s/p bilat cataract surgery in past presenting with complaint of blurred vision in the right eye, as above. Patient endorses a mild headache earlier today but otherwise history is consistent with a painless monocular vision loss. She is afebrile, mildly hypertensive to SBP 150s but otherwise hemodynamically stable. Neurologically intact.  Plan for this patient was to perform a more complete ophthalmic examination including visual acuity testing, as well as ultrasound to evaluate for possible retinal detachment, vitreous hemorrhage. Other potential differential diagnoses include central retinal vein occlusion though waxing and waning  symptoms somewhat less typical of a timeline for this. However, patient eloped prior to receiving this workup. I called patient immediately after she was found to have eloped from her bed and left a voicemail encouraging patient to return to ED for evaluation. If she is unable to return to ED, encouraged to follow up as soon as possible with her ophthalmologist.  Patient seen in conjunction with Dr. Billy Fischer, who oversaw management of this patient.    Final Clinical Impressions(s) / ED Diagnoses   Final diagnoses:  Blurred vision    New Prescriptions Discharge Medication List as of 05/01/2016  6:06 PM       Ivin Booty, MD 05/01/16 2114    Gareth Morgan, MD 05/03/16 [id number]

## 2016-05-18 ENCOUNTER — Other Ambulatory Visit: Payer: Self-pay | Admitting: Internal Medicine

## 2016-06-26 ENCOUNTER — Other Ambulatory Visit: Payer: Self-pay | Admitting: Internal Medicine

## 2016-06-26 MED ORDER — ALPRAZOLAM 1 MG PO TABS: 1.0000 mg | ORAL_TABLET | Freq: Two times a day (BID) | ORAL | 1 refills | Status: DC | PRN

## 2016-06-26 MED ORDER — OMEPRAZOLE 20 MG PO CPDR: DELAYED_RELEASE_CAPSULE | ORAL | 0 refills | Status: DC

## 2016-06-26 NOTE — Addendum Note (Signed)
Addended by: Earnstine Regal on: 06/26/2016 10:18 AM   Modules accepted: Orders

## 2016-06-26 NOTE — Telephone Encounter (Signed)
Pt left msg on triage stating she is needing refill sent to [facility] for her Omeprazole & Alprazolam. Sent Omeprazole pls advise on alprazolam.../lmnb

## 2016-06-26 NOTE — Telephone Encounter (Signed)
Done hardcopy to Corinne  

## 2016-07-31 ENCOUNTER — Encounter: Payer: BLUE CROSS/BLUE SHIELD | Admitting: Internal Medicine

## 2016-08-11 ENCOUNTER — Telehealth: Payer: Self-pay | Admitting: Internal Medicine

## 2016-08-11 NOTE — Telephone Encounter (Signed)
Patient had to move CPE to January.  Please extend labs through then.

## 2016-08-11 NOTE — Telephone Encounter (Signed)
Ok, this has been done, thanks

## 2016-08-14 ENCOUNTER — Encounter: Payer: BLUE CROSS/BLUE SHIELD | Admitting: Internal Medicine

## 2016-09-04 ENCOUNTER — Other Ambulatory Visit (INDEPENDENT_AMBULATORY_CARE_PROVIDER_SITE_OTHER): Payer: BLUE CROSS/BLUE SHIELD

## 2016-09-04 DIAGNOSIS — Z0000 Encounter for general adult medical examination without abnormal findings: Secondary | ICD-10-CM

## 2016-09-04 DIAGNOSIS — R7302 Impaired glucose tolerance (oral): Secondary | ICD-10-CM | POA: Diagnosis not present

## 2016-09-04 DIAGNOSIS — R7989 Other specified abnormal findings of blood chemistry: Secondary | ICD-10-CM | POA: Diagnosis not present

## 2016-09-04 LAB — BASIC METABOLIC PANEL
BUN: 18 mg/dL (ref 6–23)
CO2: 28 mEq/L (ref 19–32)
CREATININE: 0.83 mg/dL (ref 0.40–1.20)
Calcium: 10.2 mg/dL (ref 8.4–10.5)
Chloride: 103 mEq/L (ref 96–112)
GFR: 73.65 mL/min (ref >60.00)
Glucose, Bld: 106 mg/dL — ABNORMAL HIGH (ref 70–99)
POTASSIUM: 3.9 meq/L (ref 3.5–5.1)
Sodium: 140 mEq/L (ref 135–145)

## 2016-09-04 LAB — URINALYSIS, ROUTINE W REFLEX MICROSCOPIC
BILIRUBIN URINE: NEGATIVE
Ketones, ur: NEGATIVE
LEUKOCYTES UA: NEGATIVE
NITRITE: NEGATIVE
Specific Gravity, Urine: 1.02 (ref 1.000–1.030)
TOTAL PROTEIN, URINE-UPE24: NEGATIVE
URINE GLUCOSE: NEGATIVE
Urobilinogen, UA: 0.2 (ref 0.0–1.0)
pH: 5.5 (ref 5.0–8.0)

## 2016-09-04 LAB — LDL CHOLESTEROL, DIRECT: Direct LDL: 126 mg/dL

## 2016-09-04 LAB — HEMOGLOBIN A1C: Hgb A1c MFr Bld: 5.7 % (ref 4.6–6.5)

## 2016-09-04 LAB — CBC WITH DIFFERENTIAL/PLATELET
BASOS PCT: 0.4 % (ref 0.0–3.0)
Basophils Absolute: 0 10*3/uL (ref 0.0–0.1)
Eosinophils Absolute: 0.1 10*3/uL (ref 0.0–0.7)
Eosinophils Relative: 1.3 % (ref 0.0–5.0)
HEMATOCRIT: 44 % (ref 36.0–46.0)
HEMOGLOBIN: 15.3 g/dL — AB (ref 12.0–15.0)
Lymphocytes Relative: 32.4 % (ref 12.0–46.0)
Lymphs Abs: 1.9 10*3/uL (ref 0.7–4.0)
MCHC: 34.8 g/dL (ref 30.0–36.0)
MCV: 85.7 fl (ref 78.0–100.0)
MONOS PCT: 6.1 % (ref 3.0–12.0)
Monocytes Absolute: 0.4 10*3/uL (ref 0.1–1.0)
Neutro Abs: 3.5 10*3/uL (ref 1.4–7.7)
Neutrophils Relative %: 59.8 % (ref 43.0–77.0)
Platelets: 160 10*3/uL (ref 150.0–400.0)
RBC: 5.13 Mil/uL — ABNORMAL HIGH (ref 3.87–5.11)
RDW: 12.9 % (ref 11.5–15.5)
WBC: 5.9 10*3/uL (ref 4.0–10.5)

## 2016-09-04 LAB — HEPATIC FUNCTION PANEL
ALT: 21 U/L (ref 0–35)
AST: 20 U/L (ref 0–37)
Albumin: 4.5 g/dL (ref 3.5–5.2)
Alkaline Phosphatase: 107 U/L (ref 39–117)
BILIRUBIN TOTAL: 0.6 mg/dL (ref 0.2–1.2)
Bilirubin, Direct: 0.1 mg/dL (ref 0.0–0.3)
Total Protein: 7.4 g/dL (ref 6.0–8.3)

## 2016-09-04 LAB — LIPID PANEL
CHOL/HDL RATIO: 4
Cholesterol: 195 mg/dL (ref 0–200)
HDL: 54.7 mg/dL (ref >39.00)
NonHDL: 140.18
Triglycerides: 201 mg/dL — ABNORMAL HIGH (ref 0.0–149.0)
VLDL: 40.2 mg/dL — ABNORMAL HIGH (ref 0.0–40.0)

## 2016-09-04 LAB — TSH: TSH: 0.82 u[IU]/mL (ref 0.35–4.50)

## 2016-09-11 ENCOUNTER — Ambulatory Visit (INDEPENDENT_AMBULATORY_CARE_PROVIDER_SITE_OTHER): Payer: BLUE CROSS/BLUE SHIELD | Admitting: Internal Medicine

## 2016-09-11 ENCOUNTER — Encounter: Payer: Self-pay | Admitting: Internal Medicine

## 2016-09-11 VITALS — BP 140/78 | HR 74 | Temp 98.4°F | Resp 20 | Wt 168.0 lb

## 2016-09-11 DIAGNOSIS — Z0000 Encounter for general adult medical examination without abnormal findings: Secondary | ICD-10-CM

## 2016-09-11 DIAGNOSIS — J309 Allergic rhinitis, unspecified: Secondary | ICD-10-CM | POA: Diagnosis not present

## 2016-09-11 DIAGNOSIS — Z23 Encounter for immunization: Secondary | ICD-10-CM

## 2016-09-11 DIAGNOSIS — R7302 Impaired glucose tolerance (oral): Secondary | ICD-10-CM | POA: Diagnosis not present

## 2016-09-11 DIAGNOSIS — Z0001 Encounter for general adult medical examination with abnormal findings: Secondary | ICD-10-CM

## 2016-09-11 MED ORDER — OMEPRAZOLE 20 MG PO CPDR: DELAYED_RELEASE_CAPSULE | ORAL | 3 refills | Status: DC

## 2016-09-11 MED ORDER — ATORVASTATIN CALCIUM 80 MG PO TABS: 80.0000 mg | ORAL_TABLET | Freq: Every day | ORAL | 3 refills | Status: DC

## 2016-09-11 NOTE — Assessment & Plan Note (Signed)
Mild to mod, for add nasacort asd,  to f/u any worsening symptoms or concerns 

## 2016-09-11 NOTE — Assessment & Plan Note (Signed)
stable overall by history and exam, recent data reviewed with pt, and pt to continue medical treatment as before,  to f/u any worsening symptoms or concerns Lab Results  Component Value Date   HGBA1C 5.7 09/04/2016

## 2016-09-11 NOTE — Assessment & Plan Note (Signed)
stable overall by history and exam,  and pt to continue medical treatment as before,  to f/u any worsening symptoms or concerns, for med refill 

## 2016-09-11 NOTE — Assessment & Plan Note (Signed)
Mild uncontrolled, goal < 100, for increased lipitor to 80 qd

## 2016-09-11 NOTE — Progress Notes (Signed)
Pre visit review using our clinic review tool, if applicable. No additional management support is needed unless otherwise documented below in the visit note. 

## 2016-09-11 NOTE — Assessment & Plan Note (Signed)

## 2016-09-11 NOTE — Assessment & Plan Note (Signed)
Stable without diversion or abuse, Please continue all other medications as before

## 2016-09-11 NOTE — Progress Notes (Signed)
Subjective:    Patient ID: Molly Lewis, female    DOB: 07/16/1951, 66 y.o.   MRN: [id number]  HPI  Here for wellness and f/u;  Overall doing ok;  Pt denies Chest pain, worsening SOB, DOE, wheezing, orthopnea, PND, worsening LE edema, palpitations, dizziness or syncope.  Pt denies neurological change such as new headache, facial or extremity weakness.  Pt denies polydipsia, polyuria, or low sugar symptoms. Pt states overall good compliance with treatment and medications, good tolerability, and has been trying to follow appropriate diet.   No fever, night sweats, wt loss, loss of appetite, or other constitutional symptoms.  Pt states good ability with ADL's, has low fall risk, home safety reviewed and adequate, no other significant changes in hearing or vision, and only occasionally active with exercise.  Pt states overall good compliance with treatment and medications, good tolerability, and has been trying to follow appropriate lower chool diet.  Pt denies worsening depressive symptoms, suicidal ideation or panic. Only takes the xanax abiout every other day lately, most often now takes for sleep occasiojally, very careful about trying not to get to dependency.   Does have several wks ongoing nasal allergy symptoms with clearish congestion with anterior sinus pressure and discomfort daily despite allegra, without itch and sneezing, without fever, pain, ST, cough, swelling or wheezing. Past Medical History:  Diagnosis Date  . Allergic rhinitis, cause unspecified 07/18/2011  . ANXIETY 06/23/2007  . BREAST BIOPSY, HX OF 06/23/2007   benign  . Cataract   . Chronic constipation   . COLONIC POLYPS, HX OF 06/27/2008  . COMMON MIGRAINE 06/23/2007  . DEPRESSION 06/23/2007  . GERD 06/23/2007  . HYPERLIPIDEMIA 06/23/2007  . Impaired glucose tolerance 02/16/2011  . INSOMNIA-SLEEP DISORDER-UNSPEC 06/26/2009  . Irritable bowel syndrome 06/23/2007  . Microhematuria 07/31/2015  . OSTEOARTHRITIS,  LUMBAR SPINE 06/23/2007  . OSTEOPENIA 07/11/2008   Past Surgical History:  Procedure Laterality Date  . APPENDECTOMY    . CATARACT EXTRACTION     BIL  . TOTAL ABDOMINAL HYSTERECTOMY      reports that she quit smoking about 7 years ago. Her smoking use included Cigarettes. She has never used smokeless tobacco. She reports that she does not drink alcohol or use drugs. family history includes Brain cancer in her maternal grandmother and mother; Heart disease in her father; Hypertension in her father. Allergies  Allergen Reactions  . Codeine Nausea And Vomiting    Violent vomiting   Current Outpatient Prescriptions on File Prior to Visit  Medication Sig Dispense Refill  . ALPRAZolam (XANAX) 1 MG tablet Take 1 tablet (1 mg total) by mouth 2 (two) times daily as needed. 60 tablet 1  . aspirin EC 81 MG tablet Take 81 mg by mouth daily.    . Calcium Carbonate-Vitamin D (CALCIUM-D PO) Take 1,200 mg by mouth daily.    Marland Kitchen Fexofenadine HCl (ALLEGRA PO) Take 1 tablet by mouth daily as needed (allergies).    . Multiple Vitamin (MULTIVITAMIN WITH MINERALS) TABS tablet Take 1 tablet by mouth daily.     No current facility-administered medications on file prior to visit.    Review of Systems  Constitutional: Negative for increased diaphoresis, or other activity, appetite or siginficant weight change other than noted HENT: Negative for worsening hearing loss, ear pain, facial swelling, mouth sores and neck stiffness.   Eyes: Negative for other worsening pain, redness or visual disturbance.  Respiratory: Negative for choking or stridor Cardiovascular: Negative for other chest pain and palpitations.  Gastrointestinal: Negative for worsening diarrhea, blood in stool, or abdominal distention Genitourinary: Negative for hematuria, flank pain or change in urine volume.  Musculoskeletal: Negative for myalgias or other joint complaints.  Skin: Negative for other color change and wound or drainage.    Neurological: Negative for syncope and numbness. other than noted Hematological: Negative for adenopathy. or other swelling Psychiatric/Behavioral: Negative for hallucinations, SI, self-injury, decreased concentration or other worsening agitation.  All other system neg per pt     Objective:   Physical Exam BP 140/78   Pulse 74   Temp 98.4 F (36.9 C) (Oral)   Resp 20   Wt 168 lb (76.2 kg)   SpO2 98%   BMI 27.96 kg/m   VS noted,  Constitutional: Pt is oriented to person, place, and time. Appears well-developed and well-nourished, in no significant distress Head: Normocephalic and atraumatic  Eyes: Conjunctivae and EOM are normal. Pupils are equal, round, and reactive to light Right Ear: External ear normal.  Left Ear: External ear normal Nose: Nose normal.  Bilat tm's with mild erythema.  Max sinus areas non tender.  Pharynx without erythema, no exudate Mouth/Throat: Oropharynx is clear and moist  Neck: Normal range of motion. Neck supple. No JVD present. No tracheal deviation present or significant neck LA or mass Cardiovascular: Normal rate, regular rhythm, normal heart sounds and intact distal pulses.   Pulmonary/Chest: Effort normal and breath sounds without rales or wheezing  Abdominal: Soft. Bowel sounds are normal. NT. No HSM  Musculoskeletal: Normal range of motion. Exhibits no edema Lymphadenopathy: Has no cervical adenopathy.  Neurological: Pt is alert and oriented to person, place, and time. Pt has normal reflexes. No cranial nerve deficit. Motor grossly intact Skin: Skin is warm and dry. No rash noted or new ulcers Psychiatric:  Has normal mood and affect. Behavior is normal.  No other new exam findings    Assessment & Plan:

## 2016-09-11 NOTE — Patient Instructions (Addendum)
You had the flu shot today  Ok to increase the lipitor to 80 mg per day  Please consider using the OTC Nasacort AQ for the sinus pain  Please continue all other medications as before, and refills have been done if requested  Please have the pharmacy call with any other refills you may need.  Please continue your efforts at being more active, low cholesterol diet, and weight control.  You are otherwise up to date with prevention measures today.  Please keep your appointments with your specialists as you may have planned  Please return in 1 year for your yearly visit, or sooner if needed, with Lab testing done 3-5 days before

## 2017-03-24 ENCOUNTER — Encounter: Payer: Self-pay | Admitting: Internal Medicine

## 2017-03-24 ENCOUNTER — Other Ambulatory Visit (INDEPENDENT_AMBULATORY_CARE_PROVIDER_SITE_OTHER): Payer: BLUE CROSS/BLUE SHIELD

## 2017-03-24 ENCOUNTER — Ambulatory Visit (INDEPENDENT_AMBULATORY_CARE_PROVIDER_SITE_OTHER): Payer: BLUE CROSS/BLUE SHIELD | Admitting: Internal Medicine

## 2017-03-24 VITALS — BP 126/88 | HR 85 | Ht 65.0 in | Wt 168.0 lb

## 2017-03-24 DIAGNOSIS — R109 Unspecified abdominal pain: Secondary | ICD-10-CM | POA: Insufficient documentation

## 2017-03-24 DIAGNOSIS — R1084 Generalized abdominal pain: Secondary | ICD-10-CM

## 2017-03-24 LAB — URINALYSIS, ROUTINE W REFLEX MICROSCOPIC
Bilirubin Urine: NEGATIVE
Ketones, ur: NEGATIVE
Leukocytes, UA: NEGATIVE
Nitrite: NEGATIVE
PH: 6 (ref 5.0–8.0)
RBC / HPF: NONE SEEN (ref 0–?)
Total Protein, Urine: NEGATIVE
UROBILINOGEN UA: 0.2 (ref 0.0–1.0)
Urine Glucose: NEGATIVE
WBC, UA: NONE SEEN (ref 0–?)

## 2017-03-24 LAB — BASIC METABOLIC PANEL
BUN: 10 mg/dL (ref 6–23)
CALCIUM: 10.8 mg/dL — AB (ref 8.4–10.5)
CO2: 31 mEq/L (ref 19–32)
CREATININE: 0.81 mg/dL (ref 0.40–1.20)
Chloride: 100 mEq/L (ref 96–112)
GFR: 75.62 mL/min (ref >60.00)
GLUCOSE: 106 mg/dL — AB (ref 70–99)
Potassium: 4 mEq/L (ref 3.5–5.1)
SODIUM: 137 meq/L (ref 135–145)

## 2017-03-24 LAB — HEPATIC FUNCTION PANEL
ALBUMIN: 4.3 g/dL (ref 3.5–5.2)
ALK PHOS: 96 U/L (ref 39–117)
ALT: 28 U/L (ref 0–35)
AST: 23 U/L (ref 0–37)
Bilirubin, Direct: 0 mg/dL (ref 0.0–0.3)
Total Bilirubin: 0.4 mg/dL (ref 0.2–1.2)
Total Protein: 7.1 g/dL (ref 6.0–8.3)

## 2017-03-24 LAB — CBC WITH DIFFERENTIAL/PLATELET
BASOS ABS: 0.1 10*3/uL (ref 0.0–0.1)
Basophils Relative: 1.3 % (ref 0.0–3.0)
EOS PCT: 1.2 % (ref 0.0–5.0)
Eosinophils Absolute: 0.1 10*3/uL (ref 0.0–0.7)
HEMATOCRIT: 42.9 % (ref 36.0–46.0)
HEMOGLOBIN: 14.7 g/dL (ref 12.0–15.0)
LYMPHS ABS: 2.5 10*3/uL (ref 0.7–4.0)
LYMPHS PCT: 46.1 % — AB (ref 12.0–46.0)
MCHC: 34.2 g/dL (ref 30.0–36.0)
MCV: 86 fl (ref 78.0–100.0)
MONOS PCT: 7.5 % (ref 3.0–12.0)
Monocytes Absolute: 0.4 10*3/uL (ref 0.1–1.0)
Neutro Abs: 2.4 10*3/uL (ref 1.4–7.7)
Neutrophils Relative %: 43.9 % (ref 43.0–77.0)
Platelets: 137 10*3/uL — ABNORMAL LOW (ref 150.0–400.0)
RBC: 4.98 Mil/uL (ref 3.87–5.11)
RDW: 13.3 % (ref 11.5–15.5)
WBC: 5.4 10*3/uL (ref 4.0–10.5)

## 2017-03-24 LAB — LIPASE: Lipase: 20 U/L (ref 11.0–59.0)

## 2017-03-24 MED ORDER — DICYCLOMINE HCL 10 MG PO CAPS
10.0000 mg | ORAL_CAPSULE | Freq: Three times a day (TID) | ORAL | 3 refills | Status: DC | PRN
Start: 2017-03-24 — End: 2017-06-17

## 2017-03-24 NOTE — Progress Notes (Signed)
Subjective:    Patient ID: Molly Lewis, female    DOB: [birth year], 66 y.o.   MRN: [id number]  HPI  Here to f/u with c/o mild to mod abd pain, sharp and crampy, assoc with bloating and distension, gas feeling, and recurring loose stools despite probiotics, not assoc with wt loss or n/v.  No fever, and Denies worsening reflux, dysphagia, or blood.  Denies urinary symptoms such as dysuria, frequency, urgency, flank pain, hematuria or n/v, fever, chills. Wt Readings from Last 3 Encounters:  03/24/17 168 lb (76.2 kg)  09/11/16 168 lb (76.2 kg)  05/01/16 160 lb (72.6 kg)  Traveling soon, hoping to be improved before then Past Medical History:  Diagnosis Date  . Allergic rhinitis, cause unspecified 07/18/2011  . ANXIETY 06/23/2007  . BREAST BIOPSY, HX OF 06/23/2007   benign  . Cataract   . Chronic constipation   . COLONIC POLYPS, HX OF 06/27/2008  . COMMON MIGRAINE 06/23/2007  . DEPRESSION 06/23/2007  . GERD 06/23/2007  . HYPERLIPIDEMIA 06/23/2007  . Impaired glucose tolerance 02/16/2011  . INSOMNIA-SLEEP DISORDER-UNSPEC 06/26/2009  . Irritable bowel syndrome 06/23/2007  . Microhematuria 07/31/2015  . OSTEOARTHRITIS, LUMBAR SPINE 06/23/2007  . OSTEOPENIA 07/11/2008   Past Surgical History:  Procedure Laterality Date  . APPENDECTOMY    . CATARACT EXTRACTION     BIL  . TOTAL ABDOMINAL HYSTERECTOMY      reports that she quit smoking about 7 years ago. Her smoking use included Cigarettes. She has never used smokeless tobacco. She reports that she does not drink alcohol or use drugs. family history includes Brain cancer in her maternal grandmother and mother; Heart disease in her father; Hypertension in her father. Allergies  Allergen Reactions  . Codeine Nausea And Vomiting    Violent vomiting   Current Outpatient Prescriptions on File Prior to Visit  Medication Sig Dispense Refill  . ALPRAZolam (XANAX) 1 MG tablet Take 1 tablet (1 mg total) by mouth 2 (two) times daily as  needed. 60 tablet 1  . aspirin EC 81 MG tablet Take 81 mg by mouth daily.    Marland Kitchen atorvastatin (LIPITOR) 80 MG tablet Take 1 tablet (80 mg total) by mouth daily. 90 tablet 3  . Calcium Carbonate-Vitamin D (CALCIUM-D PO) Take 1,200 mg by mouth daily.    Marland Kitchen Fexofenadine HCl (ALLEGRA PO) Take 1 tablet by mouth daily as needed (allergies).    . Multiple Vitamin (MULTIVITAMIN WITH MINERALS) TABS tablet Take 1 tablet by mouth daily.    Marland Kitchen omeprazole (PRILOSEC) 20 MG capsule Take 1 capsule by mouth two times daily 180 capsule 3   No current facility-administered medications on file prior to visit.    Review of Systems All other system neg per pt    Objective:   Physical Exam BP 126/88   Pulse 85   Ht 5\' 5"  (1.651 m)   Wt 168 lb (76.2 kg)   SpO2 99%   BMI 27.96 kg/m  VS noted,  Constitutional: Pt appears in NAD HENT: Head: NCAT.  Right Ear: External ear normal.  Left Ear: External ear normal.  Eyes: . Pupils are equal, round, and reactive to light. Conjunctivae and EOM are normal Nose: without d/c or deformity Neck: Neck supple. Gross normal ROM Cardiovascular: Normal rate and regular rhythm.   Pulmonary/Chest: Effort normal and breath sounds without rales or wheezing.  Abd:  Soft, NT, ND, + BS, no organomegaly - benign exam Neurological: Pt is alert. At baseline orientation, motor grossly intact  Skin: Skin is warm. No rashes, other new lesions, no LE edema Psychiatric: Pt behavior is normal without agitation  No other exam findings    Assessment & Plan:

## 2017-03-24 NOTE — Patient Instructions (Signed)
Please take all new medication as prescribed - the "bentyl" (dicyclomine) for pain as needed  Please continue all other medications as before, and refills have been done if requested.  Please have the pharmacy call with any other refills you may need.  Please keep your appointments with your specialists as you may have planned  Please go to the LAB in the Basement (turn left off the elevator) for the tests to be done today  You will be contacted by phone if any changes need to be made immediately.  Otherwise, you will receive a letter about your results with an explanation, but please check with MyChart first.  Please remember to sign up for MyChart if you have not done so, as this will be important to you in the future with finding out test results, communicating by private email, and scheduling acute appointments online when needed.

## 2017-03-26 ENCOUNTER — Telehealth: Payer: Self-pay | Admitting: Internal Medicine

## 2017-03-26 NOTE — Telephone Encounter (Signed)
Called pt, LVM informing her of below msg.

## 2017-03-26 NOTE — Telephone Encounter (Signed)
With normal labs, it seems there may be a viral infection causing this?  I would try tylenol as needed, consider going to the ED over the weekend if the pain is worse (they would probably do CT scan), or call on Monday for GI referral if still having issues

## 2017-03-26 NOTE — Telephone Encounter (Signed)
Pt called checking on her labs. I gave her MD response. She said that she is still not feeling any better. She is taking the medication that he prescribed but wakes up during the middle of the night and in the morning not feeling well. Her stomach has a "sore" feeling and she feels very weak and tired. Her stomach is bloated and sore. What else can she do?

## 2017-03-27 NOTE — Assessment & Plan Note (Signed)
Hx c/w probable functional pain and diarrhea; for labs as orderedf, trial of bentyl prn,  to f/u any worsening symptoms or concerns

## 2017-05-14 ENCOUNTER — Other Ambulatory Visit: Payer: Self-pay | Admitting: Internal Medicine

## 2017-05-14 MED ORDER — ALPRAZOLAM 1 MG PO TABS: 1.0000 mg | ORAL_TABLET | Freq: Two times a day (BID) | ORAL | 1 refills | Status: DC | PRN

## 2017-05-14 NOTE — Telephone Encounter (Signed)
Pt called for a refill of her ALPRAZolam Molly Lewis) 1 MG tablet  Had CPE on 09/2016 [facility] on [address] Please advise

## 2017-05-14 NOTE — Telephone Encounter (Signed)
Done hardcopy to Shirron  

## 2017-05-14 NOTE — Telephone Encounter (Signed)
Faxed

## 2017-06-17 ENCOUNTER — Ambulatory Visit (INDEPENDENT_AMBULATORY_CARE_PROVIDER_SITE_OTHER): Payer: BLUE CROSS/BLUE SHIELD | Admitting: Internal Medicine

## 2017-06-17 ENCOUNTER — Encounter: Payer: Self-pay | Admitting: Internal Medicine

## 2017-06-17 VITALS — BP 118/82 | Temp 98.0°F | Ht 65.0 in | Wt 170.0 lb

## 2017-06-17 DIAGNOSIS — M5442 Lumbago with sciatica, left side: Secondary | ICD-10-CM

## 2017-06-17 DIAGNOSIS — M5441 Lumbago with sciatica, right side: Secondary | ICD-10-CM | POA: Diagnosis not present

## 2017-06-17 DIAGNOSIS — R7302 Impaired glucose tolerance (oral): Secondary | ICD-10-CM

## 2017-06-17 DIAGNOSIS — Z23 Encounter for immunization: Secondary | ICD-10-CM

## 2017-06-17 DIAGNOSIS — M545 Low back pain, unspecified: Secondary | ICD-10-CM | POA: Insufficient documentation

## 2017-06-17 MED ORDER — PREDNISONE 10 MG PO TABS: ORAL_TABLET | ORAL | 0 refills | Status: DC

## 2017-06-17 MED ORDER — GABAPENTIN 100 MG PO CAPS: ORAL_CAPSULE | ORAL | 0 refills | Status: DC

## 2017-06-17 MED ORDER — TRAMADOL HCL 50 MG PO TABS: 50.0000 mg | ORAL_TABLET | Freq: Four times a day (QID) | ORAL | 1 refills | Status: DC | PRN

## 2017-06-17 MED ORDER — GABAPENTIN 300 MG PO CAPS: 300.0000 mg | ORAL_CAPSULE | Freq: Three times a day (TID) | ORAL | 5 refills | Status: DC

## 2017-06-17 NOTE — Assessment & Plan Note (Signed)
stable overall by history and exam, recent data reviewed with pt, and pt to continue medical treatment as before,  to f/u any worsening symptoms or concerns, pt to call for onset polys with steroid tx or cbg > 200

## 2017-06-17 NOTE — Patient Instructions (Signed)
.  Please take all new medication as prescribed - the tramadol as needed for pain, gabapentin for pain, and prednisone for inflammation  The gabapentin is 100 mg three times daily for 1 wk, then go to the 300 mg three times per day  You will be contacted regarding the referral for: MRI lower back  Depending on the results, you may need to be referred to Orthopedic or Neurosurgury  Please continue all other medications as before, and refills have been done if requested.  Please have the pharmacy call with any other refills you may need.  Please keep your appointments with your specialists as you may have planned

## 2017-06-17 NOTE — Progress Notes (Signed)
Subjective:    Patient ID: Molly Lewis, female    DOB: 1950/12/04, 66 y.o.   MRN: [id number]  HPI  Here to f/u with c/o > 1 mo low midline lumbar pain and soreness, now constant, moderate in severity, worse to sit and lie down, better to walk but not with standing too long, radiates to both legs and has a kind of soreness to the upper and lower legs she thinks might be muscle? No hx of neuritic pain though last MRI done 2009 with spondylosis and facet arthritis.  No falls.  Ibuprofen and muscle relaxer no help.  Pt dnies bowel or bladder change, fever, wt loss.  She c/o inability to sleep well and even has to catch her breath at times from the pain.  Has no recent imaging, no recent other tx  Pt denies chest pain, increased sob or doe, wheezing, orthopnea, PND, increased LE swelling, palpitations, dizziness or syncope.  Pt denies new neurological symptoms such as new headache, or facial weakness.   Pt denies polydipsia, polyuria Past Medical History:  Diagnosis Date  . Allergic rhinitis, cause unspecified 07/18/2011  . ANXIETY 06/23/2007  . BREAST BIOPSY, HX OF 06/23/2007   benign  . Cataract   . Chronic constipation   . COLONIC POLYPS, HX OF 06/27/2008  . COMMON MIGRAINE 06/23/2007  . DEPRESSION 06/23/2007  . GERD 06/23/2007  . HYPERLIPIDEMIA 06/23/2007  . Impaired glucose tolerance 02/16/2011  . INSOMNIA-SLEEP DISORDER-UNSPEC 06/26/2009  . Irritable bowel syndrome 06/23/2007  . Microhematuria 07/31/2015  . OSTEOARTHRITIS, LUMBAR SPINE 06/23/2007  . OSTEOPENIA 07/11/2008   Past Surgical History:  Procedure Laterality Date  . APPENDECTOMY    . CATARACT EXTRACTION     BIL  . TOTAL ABDOMINAL HYSTERECTOMY      reports that she quit smoking about 8 years ago. Her smoking use included Cigarettes. She has never used smokeless tobacco. She reports that she does not drink alcohol or use drugs. family history includes Brain cancer in her maternal grandmother and mother; Heart disease in  her father; Hypertension in her father. Allergies  Allergen Reactions  . Codeine Nausea And Vomiting    Violent vomiting   Current Outpatient Prescriptions on File Prior to Visit  Medication Sig Dispense Refill  . ALPRAZolam (XANAX) 1 MG tablet Take 1 tablet (1 mg total) by mouth 2 (two) times daily as needed. 60 tablet 1  . aspirin EC 81 MG tablet Take 81 mg by mouth daily.    Marland Kitchen atorvastatin (LIPITOR) 80 MG tablet Take 1 tablet (80 mg total) by mouth daily. 90 tablet 3  . Calcium Carbonate-Vitamin D (CALCIUM-D PO) Take 1,200 mg by mouth daily.    . Multiple Vitamin (MULTIVITAMIN WITH MINERALS) TABS tablet Take 1 tablet by mouth daily.    Marland Kitchen omeprazole (PRILOSEC) 20 MG capsule Take 1 capsule by mouth two times daily 180 capsule 3   No current facility-administered medications on file prior to visit.    Review of Systems  Constitutional: Negative for other unusual diaphoresis or sweats HENT: Negative for ear discharge or swelling Eyes: Negative for other worsening visual disturbances Respiratory: Negative for stridor or other swelling  Gastrointestinal: Negative for worsening distension or other blood Genitourinary: Negative for retention or other urinary change Musculoskeletal: Negative for other MSK pain or swelling Skin: Negative for color change or other new lesions Neurological: Negative for worsening tremors and other numbness  Psychiatric/Behavioral: Negative for worsening agitation or other fatigue All other system neg per pt  Objective:   Physical Exam BP 118/82   Temp 98 F (36.7 C) (Oral)   Ht 5\' 5"  (1.651 m)   Wt 170 lb (77.1 kg)   BMI 28.29 kg/m  VS noted,  Constitutional: Pt appears in NAD HENT: Head: NCAT.  Right Ear: External ear normal.  Left Ear: External ear normal.  Eyes: . Pupils are equal, round, and reactive to light. Conjunctivae and EOM are normal Nose: without d/c or deformity Neck: Neck supple. Gross normal ROM Spine mild tender low midline  and bilat lower lumbar paravertebral Cardiovascular: Normal rate and regular rhythm.   Pulmonary/Chest: Effort normal and breath sounds without rales or wheezing.  Abd:  Soft, NT, ND, + BS, no organomegaly Neurological: Pt is alert. At baseline orientation, motor with ? Mild RLE weakness o/w  intact Skin: Skin is warm. No rashes, other new lesions, no LE edema Psychiatric: Pt behavior is normal without agitation  No other exam findings    Assessment & Plan:

## 2017-06-17 NOTE — Assessment & Plan Note (Signed)
Constant, moderate > 1 mo with possible LE neurologic change, not better with conservative tx, for tramadol prn, predpac asd, and gabapentin trial, also for MRI LS spine for persistent pain

## 2017-06-30 ENCOUNTER — Ambulatory Visit
Admission: RE | Admit: 2017-06-30 | Discharge: 2017-06-30 | Disposition: A | Discharge status: Home / Self Care | Payer: BLUE CROSS/BLUE SHIELD | Source: Ambulatory Visit | Attending: Internal Medicine | Admitting: Internal Medicine

## 2017-06-30 DIAGNOSIS — M5442 Lumbago with sciatica, left side: Secondary | ICD-10-CM

## 2017-06-30 DIAGNOSIS — M5441 Lumbago with sciatica, right side: Secondary | ICD-10-CM

## 2017-07-03 ENCOUNTER — Telehealth: Payer: Self-pay

## 2017-07-03 DIAGNOSIS — M47816 Spondylosis without myelopathy or radiculopathy, lumbar region: Secondary | ICD-10-CM

## 2017-07-03 NOTE — Telephone Encounter (Signed)
Pt has been informed and expressed understanding. She stated the pain is better but her back and legs are constantly achy everyday but not as severe. She said her worse times of pain are mainly at night after being on her feet all day. She would like to procees with orthopedic referral but declines neurosurgury referral at this time. She would like to know if it is any arthritis pain medications to try first before she did anything with neurosurgury. Please advise.   Ok to leave a VM if she doesn't answer.

## 2017-07-03 NOTE — Telephone Encounter (Signed)
-----   Message from Biagio Borg, MD sent at 07/02/2017  3:06 PM EDT ----- Left message on MyChart, pt to cont same tx except  The test results show that your current treatment is OK, except the MRI did show mild worsening of several areas of arthritis, as well as 2 new areas of mild narrowing that could potentially cause nerve irritation and pain to the legs.  I will ask my assistant to contact you to ask if your pain is improved, or would you like to be referred to Neurosurgury or orthopedics.     Molly Lewis to please inform pt, I will do referral if the patient wants this, please let me know, thanks

## 2017-07-03 NOTE — Telephone Encounter (Signed)
Ok for ortho referral - done 

## 2017-07-03 NOTE — Addendum Note (Signed)
Addended by: Biagio Borg on: 07/03/2017 08:07 PM   Modules accepted: Orders

## 2017-08-30 ENCOUNTER — Other Ambulatory Visit: Payer: Self-pay | Admitting: Internal Medicine

## 2017-09-13 ENCOUNTER — Encounter: Payer: BLUE CROSS/BLUE SHIELD | Admitting: Internal Medicine

## 2017-09-14 ENCOUNTER — Encounter: Payer: BLUE CROSS/BLUE SHIELD | Admitting: Internal Medicine

## 2017-09-21 LAB — HM MAMMOGRAPHY

## 2017-10-13 ENCOUNTER — Encounter: Payer: Self-pay | Admitting: Internal Medicine

## 2017-10-13 ENCOUNTER — Other Ambulatory Visit (INDEPENDENT_AMBULATORY_CARE_PROVIDER_SITE_OTHER): Payer: BLUE CROSS/BLUE SHIELD

## 2017-10-13 ENCOUNTER — Ambulatory Visit (INDEPENDENT_AMBULATORY_CARE_PROVIDER_SITE_OTHER): Payer: BLUE CROSS/BLUE SHIELD | Admitting: Internal Medicine

## 2017-10-13 VITALS — BP 136/88 | Temp 98.2°F | Ht 65.0 in | Wt 168.0 lb

## 2017-10-13 DIAGNOSIS — Z114 Encounter for screening for human immunodeficiency virus [HIV]: Secondary | ICD-10-CM

## 2017-10-13 DIAGNOSIS — Z23 Encounter for immunization: Secondary | ICD-10-CM

## 2017-10-13 DIAGNOSIS — R7302 Impaired glucose tolerance (oral): Secondary | ICD-10-CM

## 2017-10-13 DIAGNOSIS — Z0000 Encounter for general adult medical examination without abnormal findings: Secondary | ICD-10-CM

## 2017-10-13 LAB — CBC WITH DIFFERENTIAL/PLATELET
BASOS PCT: 1 % (ref 0.0–3.0)
Basophils Absolute: 0 10*3/uL (ref 0.0–0.1)
EOS PCT: 1.3 % (ref 0.0–5.0)
Eosinophils Absolute: 0.1 10*3/uL (ref 0.0–0.7)
HEMATOCRIT: 43.5 % (ref 36.0–46.0)
HEMOGLOBIN: 15.1 g/dL — AB (ref 12.0–15.0)
LYMPHS PCT: 47 % — AB (ref 12.0–46.0)
Lymphs Abs: 2.1 10*3/uL (ref 0.7–4.0)
MCHC: 34.8 g/dL (ref 30.0–36.0)
MCV: 85.6 fl (ref 78.0–100.0)
MONO ABS: 0.4 10*3/uL (ref 0.1–1.0)
Monocytes Relative: 8.5 % (ref 3.0–12.0)
Neutro Abs: 1.9 10*3/uL (ref 1.4–7.7)
Neutrophils Relative %: 42.2 % — ABNORMAL LOW (ref 43.0–77.0)
Platelets: 143 10*3/uL — ABNORMAL LOW (ref 150.0–400.0)
RBC: 5.09 Mil/uL (ref 3.87–5.11)
RDW: 13.6 % (ref 11.5–15.5)
WBC: 4.6 10*3/uL (ref 4.0–10.5)

## 2017-10-13 LAB — LIPID PANEL
Cholesterol: 157 mg/dL (ref 0–200)
HDL: 46.6 mg/dL (ref >39.00)
LDL Cholesterol: 71 mg/dL (ref 0–99)
NONHDL: 110.18
Total CHOL/HDL Ratio: 3
Triglycerides: 198 mg/dL — ABNORMAL HIGH (ref 0.0–149.0)
VLDL: 39.6 mg/dL (ref 0.0–40.0)

## 2017-10-13 LAB — BASIC METABOLIC PANEL
BUN: 18 mg/dL (ref 6–23)
CO2: 29 mEq/L (ref 19–32)
CREATININE: 0.9 mg/dL (ref 0.40–1.20)
Calcium: 10.1 mg/dL (ref 8.4–10.5)
Chloride: 101 mEq/L (ref 96–112)
GFR: 66.84 mL/min (ref >60.00)
GLUCOSE: 98 mg/dL (ref 70–99)
Potassium: 4.3 mEq/L (ref 3.5–5.1)
Sodium: 139 mEq/L (ref 135–145)

## 2017-10-13 LAB — URINALYSIS, ROUTINE W REFLEX MICROSCOPIC
Bilirubin Urine: NEGATIVE
Ketones, ur: NEGATIVE
Leukocytes, UA: NEGATIVE
Nitrite: NEGATIVE
PH: 5.5 (ref 5.0–8.0)
RBC / HPF: NONE SEEN (ref 0–?)
Total Protein, Urine: NEGATIVE
UROBILINOGEN UA: 0.2 (ref 0.0–1.0)
Urine Glucose: NEGATIVE

## 2017-10-13 LAB — HEPATIC FUNCTION PANEL
ALK PHOS: 101 U/L (ref 39–117)
ALT: 35 U/L (ref 0–35)
AST: 25 U/L (ref 0–37)
Albumin: 4.5 g/dL (ref 3.5–5.2)
BILIRUBIN DIRECT: 0.1 mg/dL (ref 0.0–0.3)
BILIRUBIN TOTAL: 0.8 mg/dL (ref 0.2–1.2)
TOTAL PROTEIN: 7.5 g/dL (ref 6.0–8.3)

## 2017-10-13 LAB — HEMOGLOBIN A1C: Hgb A1c MFr Bld: 5.8 % (ref 4.6–6.5)

## 2017-10-13 LAB — TSH: TSH: 1.47 u[IU]/mL (ref 0.35–4.50)

## 2017-10-13 MED ORDER — ALPRAZOLAM 1 MG PO TABS: 1.0000 mg | ORAL_TABLET | Freq: Two times a day (BID) | ORAL | 1 refills | Status: DC | PRN

## 2017-10-13 MED ORDER — OMEPRAZOLE 20 MG PO CPDR: DELAYED_RELEASE_CAPSULE | ORAL | 3 refills | Status: DC

## 2017-10-13 MED ORDER — ZOSTER VAC RECOMB ADJUVANTED 50 MCG/0.5ML IM SUSR: 0.5000 mL | Freq: Once | INTRAMUSCULAR | 1 refills | Status: AC

## 2017-10-13 MED ORDER — ATORVASTATIN CALCIUM 80 MG PO TABS: 80.0000 mg | ORAL_TABLET | Freq: Every day | ORAL | 3 refills | Status: DC

## 2017-10-13 NOTE — Progress Notes (Signed)
Subjective:    Patient ID: Molly Lewis, female    DOB: 01-08-1951, 66 y.o.   MRN: [id number]  HPI  Here for wellness and f/u;  Overall doing ok;  Pt denies Chest pain, worsening SOB, DOE, wheezing, orthopnea, PND, worsening LE edema, palpitations, dizziness or syncope.  Pt denies neurological change such as new headache, facial or extremity weakness.  Pt denies polydipsia, polyuria, or low sugar symptoms. Pt states overall good compliance with treatment and medications, good tolerability, and has been trying to follow appropriate diet.  Pt denies worsening depressive symptoms, suicidal ideation or panic. No fever, night sweats, wt loss, loss of appetite, or other constitutional symptoms.  Pt states good ability with ADL's, has low fall risk, home safety reviewed and adequate, no other significant changes in hearing or vision, and only occasionally active with exercise.  Retired at [employer].   Pt continues to have recurring LBP without change in severity, bowel or bladder change, fever, wt loss,  worsening LE pain/numbness/weakness, gait change or falls, but meds not working well, may be having nerve ablation procedure soon per Dr Lynann Bologna.   No other interval hx or new complaints Past Medical History:  Diagnosis Date  . Allergic rhinitis, cause unspecified 07/18/2011  . ANXIETY 06/23/2007  . BREAST BIOPSY, HX OF 06/23/2007   benign  . Cataract   . Chronic constipation   . COLONIC POLYPS, HX OF 06/27/2008  . COMMON MIGRAINE 06/23/2007  . DEPRESSION 06/23/2007  . GERD 06/23/2007  . HYPERLIPIDEMIA 06/23/2007  . Impaired glucose tolerance 02/16/2011  . INSOMNIA-SLEEP DISORDER-UNSPEC 06/26/2009  . Irritable bowel syndrome 06/23/2007  . Microhematuria 07/31/2015  . OSTEOARTHRITIS, LUMBAR SPINE 06/23/2007  . OSTEOPENIA 07/11/2008   Past Surgical History:  Procedure Laterality Date  . APPENDECTOMY    . CATARACT EXTRACTION     BIL  . TOTAL ABDOMINAL HYSTERECTOMY      reports that she quit  smoking about 8 years ago. Her smoking use included cigarettes. she has never used smokeless tobacco. She reports that she does not drink alcohol or use drugs. family history includes Brain cancer in her maternal grandmother and mother; Heart disease in her father; Hypertension in her father. Allergies  Allergen Reactions  . Codeine Nausea And Vomiting    Violent vomiting   Current Outpatient Medications on File Prior to Visit  Medication Sig Dispense Refill  . aspirin EC 81 MG tablet Take 81 mg by mouth daily.    . Calcium Carbonate-Vitamin D (CALCIUM-D PO) Take 1,200 mg by mouth daily.    Marland Kitchen gabapentin (NEURONTIN) 100 MG capsule 1 by mouth three times per day for 1 week to start treatment 21 capsule 0  . gabapentin (NEURONTIN) 300 MG capsule Take 1 capsule (300 mg total) by mouth 3 (three) times daily. - to start after the 100 mg treatment 90 capsule 5  . Multiple Vitamin (MULTIVITAMIN WITH MINERALS) TABS tablet Take 1 tablet by mouth daily.    . traMADol (ULTRAM) 50 MG tablet Take 1 tablet (50 mg total) by mouth 4 (four) times daily as needed. 120 tablet 1   No current facility-administered medications on file prior to visit.    Review of Systems Constitutional: Negative for other unusual diaphoresis, sweats, appetite or weight changes HENT: Negative for other worsening hearing loss, ear pain, facial swelling, mouth sores or neck stiffness.   Eyes: Negative for other worsening pain, redness or other visual disturbance.  Respiratory: Negative for other stridor or swelling Cardiovascular: Negative for other  palpitations or other chest pain  Gastrointestinal: Negative for worsening diarrhea or loose stools, blood in stool, distention or other pain Genitourinary: Negative for hematuria, flank pain or other change in urine volume.  Musculoskeletal: Negative for myalgias or other joint swelling.  Skin: Negative for other color change, or other wound or worsening drainage.  Neurological:  Negative for other syncope or numbness. Hematological: Negative for other adenopathy or swelling Psychiatric/Behavioral: Negative for hallucinations, other worsening agitation, SI, self-injury, or new decreased concentration All other system neg per pt    Objective:   Physical Exam BP 136/88   Temp 98.2 F (36.8 C) (Oral)   Ht 5\' 5"  (1.651 m)   Wt 168 lb (76.2 kg)   BMI 27.96 kg/m  VS noted,  Constitutional: Pt is oriented to person, place, and time. Appears well-developed and well-nourished, in no significant distress and comfortable Head: Normocephalic and atraumatic  Eyes: Conjunctivae and EOM are normal. Pupils are equal, round, and reactive to light Right Ear: External ear normal without discharge Left Ear: External ear normal without discharge Nose: Nose without discharge or deformity Mouth/Throat: Oropharynx is without other ulcerations and moist  Neck: Normal range of motion. Neck supple. No JVD present. No tracheal deviation present or significant neck LA or mass Cardiovascular: Normal rate, regular rhythm, normal heart sounds and intact distal pulses.   Pulmonary/Chest: WOB normal and breath sounds without rales or wheezing  Abdominal: Soft. Bowel sounds are normal. NT. No HSM  Musculoskeletal: Normal range of motion. Exhibits no edema Lymphadenopathy: Has no other cervical adenopathy.  Neurological: Pt is alert and oriented to person, place, and time. Pt has normal reflexes. No cranial nerve deficit. Motor grossly intact, Gait intact Skin: Skin is warm and dry. No rash noted or new ulcerations Psychiatric:  Has normal mood and affect. Behavior is normal without agitation No other exam findings    Assessment & Plan:

## 2017-10-13 NOTE — Patient Instructions (Addendum)
You had the Pneumovax pneumonia shot today  Please continue all other medications as before, and refills have been done if requested.  Please have the pharmacy call with any other refills you may need.  Please continue your efforts at being more active, low cholesterol diet, and weight control.  You are otherwise up to date with prevention measures today.  Please keep your appointments with your specialists as you may have planned  Please go to the LAB in the Basement (turn left off the elevator) for the tests to be done today  You will be contacted by phone if any changes need to be made immediately.  Otherwise, you will receive a letter about your results with an explanation, but please check with MyChart first.  Please remember to sign up for MyChart if you have not done so, as this will be important to you in the future with finding out test results, communicating by private email, and scheduling acute appointments online when needed.  Please return in 1 year for your yearly visit, or sooner if needed, with Lab testing done 3-5 days before  

## 2017-10-14 LAB — HIV ANTIBODY (ROUTINE TESTING W REFLEX): HIV: NONREACTIVE

## 2017-10-17 NOTE — Assessment & Plan Note (Signed)
Lab Results  Component Value Date   HGBA1C 5.8 10/13/2017  stable overall by history and exam, recent data reviewed with pt, and pt to continue medical treatment as before,  to f/u any worsening symptoms or concerns

## 2017-10-17 NOTE — Assessment & Plan Note (Signed)

## 2018-01-19 DIAGNOSIS — J029 Acute pharyngitis, unspecified: Secondary | ICD-10-CM | POA: Diagnosis not present

## 2018-01-25 DIAGNOSIS — L812 Freckles: Secondary | ICD-10-CM | POA: Diagnosis not present

## 2018-01-25 DIAGNOSIS — L820 Inflamed seborrheic keratosis: Secondary | ICD-10-CM | POA: Diagnosis not present

## 2018-01-25 DIAGNOSIS — L720 Epidermal cyst: Secondary | ICD-10-CM | POA: Diagnosis not present

## 2018-01-25 DIAGNOSIS — L821 Other seborrheic keratosis: Secondary | ICD-10-CM | POA: Diagnosis not present

## 2018-01-25 DIAGNOSIS — L218 Other seborrheic dermatitis: Secondary | ICD-10-CM | POA: Diagnosis not present

## 2018-01-25 DIAGNOSIS — D1801 Hemangioma of skin and subcutaneous tissue: Secondary | ICD-10-CM | POA: Diagnosis not present

## 2018-04-05 DIAGNOSIS — M5416 Radiculopathy, lumbar region: Secondary | ICD-10-CM | POA: Diagnosis not present

## 2018-04-12 ENCOUNTER — Telehealth: Payer: Self-pay | Admitting: Internal Medicine

## 2018-04-12 NOTE — Telephone Encounter (Signed)
Copied from [place] [id number]. Topic: Quick Communication - Rx Refill/Question >> Apr 12, 2018  9:06 AM Burchel, Abbi R wrote: Medication: omeprazole (PRILOSEC) 20 MG capsule, atorvastatin (LIPITOR) 80 MG tablet  Preferred Pharmacy: [facility] [place], [place] - [place] [address] [place] [address] Phone: [phone] Fax: [phone]   Pt states a new rx needs to be sent to this pharmacy since she is switching to them from a mail-order service.  Pt was advised that RX refills may take up to 3 business days.

## 2018-04-12 NOTE — Telephone Encounter (Signed)
Patient return the phone call to state she is unable to contact [facility] mail in regards to her medication refill. She is requesting a nurse to call her back. Please advise

## 2018-04-12 NOTE — Telephone Encounter (Signed)
Attempted to call patient regarding her prescriptions to be transferred to [facility]. No answer, left message that patient can call [facility] and they can request the refills from [facility], [place], [place]. Give the office a call back for any questions.

## 2018-04-13 MED ORDER — ATORVASTATIN CALCIUM 80 MG PO TABS: 80.0000 mg | ORAL_TABLET | Freq: Every day | ORAL | 1 refills | Status: DC

## 2018-04-13 MED ORDER — OMEPRAZOLE 20 MG PO CPDR
DELAYED_RELEASE_CAPSULE | ORAL | 1 refills | Status: DC
Start: 2018-04-13 — End: 2018-10-14

## 2018-04-13 NOTE — Telephone Encounter (Signed)
Cancel script w/[facility] sent new rx to local [facility].Marland KitchenJohny Chess

## 2018-04-28 DIAGNOSIS — M545 Low back pain: Secondary | ICD-10-CM | POA: Diagnosis not present

## 2018-04-28 DIAGNOSIS — M5416 Radiculopathy, lumbar region: Secondary | ICD-10-CM | POA: Diagnosis not present

## 2018-05-11 ENCOUNTER — Other Ambulatory Visit: Payer: Self-pay | Admitting: Internal Medicine

## 2018-05-11 MED ORDER — TRAMADOL HCL 50 MG PO TABS: 50.0000 mg | ORAL_TABLET | Freq: Four times a day (QID) | ORAL | 0 refills | Status: DC | PRN

## 2018-05-18 DIAGNOSIS — M5416 Radiculopathy, lumbar region: Secondary | ICD-10-CM | POA: Diagnosis not present

## 2018-06-07 DIAGNOSIS — M47816 Spondylosis without myelopathy or radiculopathy, lumbar region: Secondary | ICD-10-CM | POA: Diagnosis not present

## 2018-06-07 DIAGNOSIS — M545 Low back pain: Secondary | ICD-10-CM | POA: Diagnosis not present

## 2018-06-07 DIAGNOSIS — M7061 Trochanteric bursitis, right hip: Secondary | ICD-10-CM | POA: Diagnosis not present

## 2018-06-14 DIAGNOSIS — Z23 Encounter for immunization: Secondary | ICD-10-CM | POA: Diagnosis not present

## 2018-07-11 DIAGNOSIS — H43811 Vitreous degeneration, right eye: Secondary | ICD-10-CM | POA: Diagnosis not present

## 2018-07-11 DIAGNOSIS — H35371 Puckering of macula, right eye: Secondary | ICD-10-CM | POA: Diagnosis not present

## 2018-07-11 DIAGNOSIS — H33301 Unspecified retinal break, right eye: Secondary | ICD-10-CM | POA: Diagnosis not present

## 2018-07-11 DIAGNOSIS — Z961 Presence of intraocular lens: Secondary | ICD-10-CM | POA: Diagnosis not present

## 2018-07-16 ENCOUNTER — Other Ambulatory Visit: Payer: Self-pay

## 2018-07-16 ENCOUNTER — Encounter (HOSPITAL_COMMUNITY): Payer: Self-pay | Admitting: Emergency Medicine

## 2018-07-16 ENCOUNTER — Emergency Department (HOSPITAL_COMMUNITY)
Admission: EM | Admit: 2018-07-16 | Discharge: 2018-07-16 | Disposition: A | Discharge status: Home / Self Care | Payer: BLUE CROSS/BLUE SHIELD | Attending: Emergency Medicine | Admitting: Emergency Medicine

## 2018-07-16 DIAGNOSIS — Z87891 Personal history of nicotine dependence: Secondary | ICD-10-CM | POA: Diagnosis not present

## 2018-07-16 DIAGNOSIS — Z79899 Other long term (current) drug therapy: Secondary | ICD-10-CM | POA: Diagnosis not present

## 2018-07-16 DIAGNOSIS — Z7982 Long term (current) use of aspirin: Secondary | ICD-10-CM | POA: Insufficient documentation

## 2018-07-16 DIAGNOSIS — T7840XA Allergy, unspecified, initial encounter: Secondary | ICD-10-CM | POA: Diagnosis not present

## 2018-07-16 DIAGNOSIS — R21 Rash and other nonspecific skin eruption: Secondary | ICD-10-CM | POA: Diagnosis not present

## 2018-07-16 DIAGNOSIS — R000 Tachycardia, unspecified: Secondary | ICD-10-CM | POA: Diagnosis not present

## 2018-07-16 HISTORY — DX: Bursopathy, unspecified: M71.9

## 2018-07-16 HISTORY — DX: Other chronic pain: G89.29

## 2018-07-16 HISTORY — DX: Dorsalgia, unspecified: M54.9

## 2018-07-16 MED ORDER — FAMOTIDINE IN NACL 20-0.9 MG/50ML-% IV SOLN
20.0000 mg | Freq: Once | INTRAVENOUS | Status: AC
  Administered 2018-07-16: 20 mg via INTRAVENOUS
  Filled 2018-07-16: qty 50

## 2018-07-16 MED ORDER — SODIUM CHLORIDE 0.9 % IV BOLUS
500.0000 mL | Freq: Once | INTRAVENOUS | Status: AC
  Administered 2018-07-16: 500 mL via INTRAVENOUS

## 2018-07-16 MED ORDER — METHYLPREDNISOLONE SODIUM SUCC 125 MG IJ SOLR
125.0000 mg | Freq: Once | INTRAMUSCULAR | Status: AC
  Administered 2018-07-16: 125 mg via INTRAVENOUS
  Filled 2018-07-16: qty 2

## 2018-07-16 MED ORDER — DIPHENHYDRAMINE HCL 50 MG/ML IJ SOLN
25.0000 mg | Freq: Once | INTRAMUSCULAR | Status: AC
  Administered 2018-07-16: 25 mg via INTRAVENOUS
  Filled 2018-07-16: qty 1

## 2018-07-16 NOTE — ED Provider Notes (Signed)
[facility] EMERGENCY DEPARTMENT Provider Note   CSN: [id number] Arrival date & time: 07/16/18  1130     History   Chief Complaint Chief Complaint  Patient presents with  . Allergic Reaction    HPI Molly Lewis is a 67 y.o. female.  Patient reports skin redness and itching since his p.m. last night predominantly on her head and arms.  She had difficulty swallowing and was slightly dyspneic.  She is uncertain as to what precipitated this event.  No previous allergy problems.  She went to urgent care center was sent to the emergency department.  Severity of symptoms is moderate.     Past Medical History:  Diagnosis Date  . Allergic rhinitis, cause unspecified 07/18/2011  . ANXIETY 06/23/2007  . BREAST BIOPSY, HX OF 06/23/2007   benign  . Bursitis   . Cataract   . Chronic back pain   . Chronic constipation   . COLONIC POLYPS, HX OF 06/27/2008  . COMMON MIGRAINE 06/23/2007  . DEPRESSION 06/23/2007  . GERD 06/23/2007  . HYPERLIPIDEMIA 06/23/2007  . Impaired glucose tolerance 02/16/2011  . INSOMNIA-SLEEP DISORDER-UNSPEC 06/26/2009  . Irritable bowel syndrome 06/23/2007  . Microhematuria 07/31/2015  . OSTEOARTHRITIS, LUMBAR SPINE 06/23/2007  . OSTEOPENIA 07/11/2008    Patient Active Problem List   Diagnosis Date Noted  . Low back pain 06/17/2017  . Abdominal pain 03/24/2017  . Microhematuria 07/31/2015  . Right knee pain 02/06/2015  . Left knee pain 02/06/2015  . Bilateral plantar fasciitis 07/23/2013  . Right lateral epicondylitis 07/23/2013  . Allergic rhinitis 07/18/2011  . Encounter for long-term (current) use of high-risk medication 07/17/2011  . Preventative health care 02/16/2011  . Impaired glucose tolerance 02/16/2011  . INSOMNIA-SLEEP DISORDER-UNSPEC 06/26/2009  . INTERMITTENT VERTIGO 05/20/2009  . OSTEOPENIA 07/11/2008  . Obesity, unspecified 06/27/2008  . MENOPAUSAL DISORDER 06/27/2008  . COLONIC POLYPS, HX OF 06/27/2008  . LIVER FUNCTION TESTS,  ABNORMAL 08/30/2007  . Hyperlipidemia 06/23/2007  . Anxiety state 06/23/2007  . DEPRESSION 06/23/2007  . COMMON MIGRAINE 06/23/2007  . GERD 06/23/2007  . Irritable bowel syndrome 06/23/2007  . OSTEOARTHRITIS, LUMBAR SPINE 06/23/2007  . LOW BACK PAIN 06/23/2007  . BREAST BIOPSY, HX OF 06/23/2007    Past Surgical History:  Procedure Laterality Date  . APPENDECTOMY    . CATARACT EXTRACTION     BIL  . TOTAL ABDOMINAL HYSTERECTOMY       OB History   None      Home Medications    Prior to Admission medications   Medication Sig Start Date End Date Taking? Authorizing Provider  aspirin EC 81 MG tablet Take 81 mg by mouth daily.   Yes [provider]  atorvastatin (LIPITOR) 80 MG tablet Take 1 tablet (80 mg total) by mouth daily. 04/13/18  Yes Biagio Borg, MD  Calcium Carbonate-Vitamin D (CALCIUM-D PO) Take 1,200 mg by mouth daily.   Yes [provider]  metroNIDAZOLE (METROGEL) 0.75 % gel APPLY TO AFFECTED AREA TWICE A DAY 06/13/18  Yes [provider]  Multiple Vitamin (MULTIVITAMIN WITH MINERALS) TABS tablet Take 1 tablet by mouth daily.   Yes [provider]  omeprazole (PRILOSEC) 20 MG capsule Take 1 capsule by mouth two times daily 04/13/18  Yes Biagio Borg, MD  ALPRAZolam Duanne Moron) 1 MG tablet Take 1 tablet (1 mg total) by mouth 2 (two) times daily as needed. 10/13/17   Biagio Borg, MD  gabapentin (NEURONTIN) 300 MG capsule Take 1 capsule (300 mg  total) by mouth 3 (three) times daily. - to start after the 100 mg treatment Patient taking differently: Take 300 mg by mouth as needed. - to start after the 100 mg treatment 06/17/17   Biagio Borg, MD  traMADol (ULTRAM) 50 MG tablet Take 1 tablet (50 mg total) by mouth 4 (four) times daily as needed. 05/11/18   Biagio Borg, MD    Family History Family History  Problem Relation Age of Onset  . Heart disease Father        MI in early 36's  . Hypertension Father   . Brain cancer Mother   . Brain  cancer Maternal Grandmother     Social History Social History   Tobacco Use  . Smoking status: Former Smoker    Types: Cigarettes    Last attempt to quit: 04/16/2009    Years since quitting: 9.2  . Smokeless tobacco: Never Used  . Tobacco comment: Quit smoking 04/15/09  Substance Use Topics  . Alcohol use: No    Comment: rare,occasional  . Drug use: No     Allergies   Codeine   Review of Systems Review of Systems  All other systems reviewed and are negative.    Physical Exam Updated Vital Signs BP 136/66   Pulse 80   Temp 98.2 F (36.8 C) (Oral)   Resp (!) 26   Ht 5\' 5"  (1.651 m)   Wt 77.1 kg   SpO2 98%   BMI 28.29 kg/m   Physical Exam  Constitutional: She is oriented to person, place, and time. She appears well-developed and well-nourished.  HENT:  Head: Normocephalic and atraumatic.  Eyes: Conjunctivae are normal.  Neck: Neck supple.  Cardiovascular: Normal rate and regular rhythm.  Pulmonary/Chest: Effort normal and breath sounds normal.  Abdominal: Soft. Bowel sounds are normal.  Musculoskeletal: Normal range of motion.  Neurological: She is alert and oriented to person, place, and time.  Skin:  Erythematous macular rash on scalp and bilateral arms  Psychiatric: She has a normal mood and affect. Her behavior is normal.  Nursing note and vitals reviewed.    ED Treatments / Results  Labs (all labs ordered are listed, but only abnormal results are displayed) Labs Reviewed - No data to display  EKG EKG Interpretation  Date/Time:  Saturday July 16 2018 11:43:45 EST Ventricular Rate:  102 PR Interval:  156 QRS Duration: 76 QT Interval:  350 QTC Calculation: 456 R Axis:     Text Interpretation:  Sinus tachycardia Septal infarct , age undetermined Abnormal ECG Confirmed by Nat Christen [phone]) on 07/16/2018 1:10:03 PM   Radiology No results found.  Procedures Procedures (including critical care time)  Medications Ordered in  ED Medications  sodium chloride 0.9 % bolus 500 mL (0 mLs Intravenous Stopped 07/16/18 1345)  methylPREDNISolone sodium succinate (SOLU-MEDROL) 125 mg/2 mL injection 125 mg (125 mg Intravenous Given 07/16/18 1242)  diphenhydrAMINE (BENADRYL) injection 25 mg (25 mg Intravenous Given 07/16/18 1242)  famotidine (PEPCID) IVPB 20 mg premix (0 mg Intravenous Stopped 07/16/18 1315)     Initial Impression / Assessment and Plan / ED Course  I have reviewed the triage vital signs and the nursing notes.  Pertinent labs & imaging results that were available during my care of the patient were reviewed by me and considered in my medical decision making (see chart for details).     History and physical consistent with allergic phenomena.  She responded well to IV steroids, IV Benadryl, IV Pepcid.  She has a prescription for oral prednisone.  Final Clinical Impressions(s) / ED Diagnoses   Final diagnoses:  Allergic reaction, initial encounter    ED Discharge Orders    None       Nat Christen, MD 07/16/18 1427

## 2018-07-16 NOTE — ED Triage Notes (Signed)
Pt c/o allergic reaction with hives. States she went to UC this morning and was given Prednisone injection and PO prednisone RX. States when she got home she began to feel SOB and chest tightness. States she is unable to eat or drink due to pain in her chest.

## 2018-07-16 NOTE — Discharge Instructions (Addendum)
Can start prednisone if symptoms return.  It would be also appropriate to take Benadryl and Pepcid.  Return if dramatically worse.

## 2018-07-22 ENCOUNTER — Encounter: Payer: Self-pay | Admitting: Internal Medicine

## 2018-07-22 ENCOUNTER — Ambulatory Visit (INDEPENDENT_AMBULATORY_CARE_PROVIDER_SITE_OTHER): Payer: BLUE CROSS/BLUE SHIELD | Admitting: Internal Medicine

## 2018-07-22 ENCOUNTER — Other Ambulatory Visit: Payer: BLUE CROSS/BLUE SHIELD

## 2018-07-22 ENCOUNTER — Ambulatory Visit (INDEPENDENT_AMBULATORY_CARE_PROVIDER_SITE_OTHER)
Admission: RE | Admit: 2018-07-22 | Discharge: 2018-07-22 | Disposition: A | Discharge status: Home / Self Care | Payer: BLUE CROSS/BLUE SHIELD | Source: Ambulatory Visit | Attending: Internal Medicine | Admitting: Internal Medicine

## 2018-07-22 VITALS — BP 122/84 | HR 52 | Temp 98.4°F | Ht 65.0 in | Wt 169.0 lb

## 2018-07-22 DIAGNOSIS — R7302 Impaired glucose tolerance (oral): Secondary | ICD-10-CM | POA: Diagnosis not present

## 2018-07-22 DIAGNOSIS — R1310 Dysphagia, unspecified: Secondary | ICD-10-CM

## 2018-07-22 DIAGNOSIS — T783XXD Angioneurotic edema, subsequent encounter: Secondary | ICD-10-CM

## 2018-07-22 DIAGNOSIS — R079 Chest pain, unspecified: Secondary | ICD-10-CM

## 2018-07-22 DIAGNOSIS — R0602 Shortness of breath: Secondary | ICD-10-CM | POA: Diagnosis not present

## 2018-07-22 DIAGNOSIS — T783XXA Angioneurotic edema, initial encounter: Secondary | ICD-10-CM | POA: Insufficient documentation

## 2018-07-22 NOTE — Assessment & Plan Note (Signed)
stable overall by history and exam, recent data reviewed with pt, and pt to continue medical treatment as before,  to f/u any worsening symptoms or concerns  

## 2018-07-22 NOTE — Assessment & Plan Note (Signed)
With rash resolved, has some tingling to feet and hands, ok for claritin 10 qd prn, also check food allergy panel and [place] allergy panel as well, consider allergy referral

## 2018-07-22 NOTE — Patient Instructions (Signed)
OK to take the claritin 10 mg per day OTC  You will be contacted regarding the referral for: Gastroenterology for the swallowing  Please continue all other medications as before, and refills have been done if requested.  Please have the pharmacy call with any other refills you may need.  Please continue your efforts at being more active, low cholesterol diet, and weight control.  You are otherwise up to date with prevention measures today.  Please keep your appointments with your specialists as you may have planned  Please go to the XRAY Department in the Basement (go straight as you get off the elevator) for the x-ray testing  Please go to the LAB in the Basement (turn left off the elevator) for the tests to be done today  You will be contacted by phone if any changes need to be made immediately.  Otherwise, you will receive a letter about your results with an explanation, but please check with MyChart first.  Please remember to sign up for MyChart if you have not done so, as this will be important to you in the future with finding out test results, communicating by private email, and scheduling acute appointments online when needed.

## 2018-07-22 NOTE — Progress Notes (Signed)
Established Patient Office Visit  Subjective:  Patient ID: Molly Lewis, female    DOB: 1951/02/07  Age: 67 y.o. MRN: [id number]  CC: No chief complaint on file.   HPI Molly Lewis presents for episode last Friday night with onset marked scalp itching, then woke up next am with facial swelling and rash.  Seen at [facility] and given "some shot" but seemed actually worse after, had some dysphagia with trying eat a solid, went to ED and given IVFs and other tx.  Here today with c/o CP, onset with the scalp itching, mild to mod, constant, without radiation, not better with antacid so worried about "something else wrong"  At [facility] ED did have ECG and labs (no xray) neg per pt.  Tx with prednisone for home and believes pretty much all better.  Does eat peanut butter daily, but no other obvious triggers, no recent med changes or ACE /ARB use.  Concerned also b/c she is leaving on a cruise to [place] next month.  Cp not assoc with sob, dizziness, or heart racing.   Pt denies fever, wt loss, night sweats, loss of appetite, or other constitutional symptoms  Incidentally throat is starting to hurt today.  No peanut butter since Friday, but remembers peanut butter crackers just before the allergy reaction.   Past Medical History:  Diagnosis Date  . Allergic rhinitis, cause unspecified 07/18/2011  . ANXIETY 06/23/2007  . BREAST BIOPSY, HX OF 06/23/2007   benign  . Bursitis   . Cataract   . Chronic back pain   . Chronic constipation   . COLONIC POLYPS, HX OF 06/27/2008  . COMMON MIGRAINE 06/23/2007  . DEPRESSION 06/23/2007  . GERD 06/23/2007  . HYPERLIPIDEMIA 06/23/2007  . Impaired glucose tolerance 02/16/2011  . INSOMNIA-SLEEP DISORDER-UNSPEC 06/26/2009  . Irritable bowel syndrome 06/23/2007  . Microhematuria 07/31/2015  . OSTEOARTHRITIS, LUMBAR SPINE 06/23/2007  . OSTEOPENIA 07/11/2008    Past Surgical History:  Procedure Laterality Date  . APPENDECTOMY    . CATARACT EXTRACTION     BIL    . TOTAL ABDOMINAL HYSTERECTOMY      Family History  Problem Relation Age of Onset  . Heart disease Father        MI in early 23's  . Hypertension Father   . Brain cancer Mother   . Brain cancer Maternal Grandmother     Social History   Socioeconomic History  . Marital status: Married    Spouse name: Not on file  . Number of children: 2  . Years of education: Not on file  . Highest education level: Not on file  Occupational History  . Not on file  Social Needs  . Financial resource strain: Not on file  . Food insecurity:    Worry: Not on file    Inability: Not on file  . Transportation needs:    Medical: Not on file    Non-medical: Not on file  Tobacco Use  . Smoking status: Former Smoker    Types: Cigarettes    Last attempt to quit: 04/16/2009    Years since quitting: 9.2  . Smokeless tobacco: Never Used  . Tobacco comment: Quit smoking 04/15/09  Substance and Sexual Activity  . Alcohol use: No    Comment: rare,occasional  . Drug use: No  . Sexual activity: Yes  Lifestyle  . Physical activity:    Days per week: Not on file    Minutes per session: Not on file  . Stress:  Not on file  Relationships  . Social connections:    Talks on phone: Not on file    Gets together: Not on file    Attends religious service: Not on file    Active member of club or organization: Not on file    Attends meetings of clubs or organizations: Not on file    Relationship status: Not on file  . Intimate partner violence:    Fear of current or ex partner: Not on file    Emotionally abused: Not on file    Physically abused: Not on file    Forced sexual activity: Not on file  Other Topics Concern  . Not on file  Social History Narrative  . Not on file    Outpatient Medications Prior to Visit  Medication Sig Dispense Refill  . ALPRAZolam (XANAX) 1 MG tablet Take 1 tablet (1 mg total) by mouth 2 (two) times daily as needed. 60 tablet 1  . aspirin EC 81 MG tablet Take 81 mg by  mouth daily.    Marland Kitchen atorvastatin (LIPITOR) 80 MG tablet Take 1 tablet (80 mg total) by mouth daily. 90 tablet 1  . Calcium Carbonate-Vitamin D (CALCIUM-D PO) Take 1,200 mg by mouth daily.    Marland Kitchen gabapentin (NEURONTIN) 300 MG capsule Take 1 capsule (300 mg total) by mouth 3 (three) times daily. - to start after the 100 mg treatment (Patient taking differently: Take 300 mg by mouth as needed. - to start after the 100 mg treatment) 90 capsule 5  . metroNIDAZOLE (METROGEL) 0.75 % gel APPLY TO AFFECTED AREA TWICE A DAY  2  . Multiple Vitamin (MULTIVITAMIN WITH MINERALS) TABS tablet Take 1 tablet by mouth daily.    Marland Kitchen omeprazole (PRILOSEC) 20 MG capsule Take 1 capsule by mouth two times daily 180 capsule 1  . traMADol (ULTRAM) 50 MG tablet Take 1 tablet (50 mg total) by mouth 4 (four) times daily as needed. 120 tablet 0   No facility-administered medications prior to visit.     Allergies  Allergen Reactions  . Codeine Nausea And Vomiting    Violent vomiting   Review of Systems  Constitutional: Negative for other unusual diaphoresis or sweats HENT: Negative for ear discharge or swelling Eyes: Negative for other worsening visual disturbances Respiratory: Negative for stridor or other swelling  Gastrointestinal: Negative for worsening distension or other blood Genitourinary: Negative for retention or other urinary change Musculoskeletal: Negative for other MSK pain or swelling Skin: Negative for color change or other new lesions Neurological: Negative for worsening tremors and other numbness  Psychiatric/Behavioral: Negative for worsening agitation or other fatigue All other system neg per pt   Objective:    Physical Exam  BP 122/84   Pulse (!) 52   Temp 98.4 F (36.9 C) (Oral)   Ht 5\' 5"  (1.651 m)   Wt 169 lb (76.7 kg)   SpO2 99%   BMI 28.12 kg/m  Wt Readings from Last 3 Encounters:  07/22/18 169 lb (76.7 kg)  07/16/18 170 lb (77.1 kg)  10/13/17 168 lb (76.2 kg)  VS noted,   Constitutional: Pt appears in NAD HENT: Head: NCAT.  Right Ear: External ear normal.  Left Ear: External ear normal.  Eyes: . Pupils are equal, round, and reactive to light. Conjunctivae and EOM are normal Nose: without d/c or deformity Neck: Neck supple. Gross normal ROM Cardiovascular: Normal rate and regular rhythm.   Pulmonary/Chest: Effort normal and breath sounds without rales or wheezing.  Abd:  Soft, NT, ND, + BS, no organomegaly Neurological: Pt is alert. At baseline orientation, motor grossly intact Skin: Skin is warm. No rashes, other new lesions, no LE edema Psychiatric: Pt behavior is normal without agitation  No other exam findings  Health Maintenance Due  Topic Date Due  . DEXA SCAN  01/15/2018    There are no preventive care reminders to display for this patient.  Lab Results  Component Value Date   TSH 1.47 10/13/2017   Lab Results  Component Value Date   WBC 4.6 10/13/2017   HGB 15.1 (H) 10/13/2017   HCT 43.5 10/13/2017   MCV 85.6 10/13/2017   PLT 143.0 (L) 10/13/2017   Lab Results  Component Value Date   NA 139 10/13/2017   K 4.3 10/13/2017   CO2 29 10/13/2017   GLUCOSE 98 10/13/2017   BUN 18 10/13/2017   CREATININE 0.90 10/13/2017   BILITOT 0.8 10/13/2017   ALKPHOS 101 10/13/2017   AST 25 10/13/2017   ALT 35 10/13/2017   PROT 7.5 10/13/2017   ALBUMIN 4.5 10/13/2017   CALCIUM 10.1 10/13/2017   GFR 66.84 10/13/2017   Lab Results  Component Value Date   CHOL 157 10/13/2017   Lab Results  Component Value Date   HDL 46.60 10/13/2017   Lab Results  Component Value Date   LDLCALC 71 10/13/2017   Lab Results  Component Value Date   TRIG 198.0 (H) 10/13/2017   Lab Results  Component Value Date   CHOLHDL 3 10/13/2017   Lab Results  Component Value Date   HGBA1C 5.8 10/13/2017      Assessment & Plan:   Problem List Items Addressed This Visit    None      No orders of the defined types were placed in this  encounter.   Follow-up: No follow-ups on file.    Cathlean Cower, MD

## 2018-07-22 NOTE — Assessment & Plan Note (Signed)
Etiology unclear, atypical, occurred with dysphagia so suspect GI related, for cxr for completeness

## 2018-07-22 NOTE — Assessment & Plan Note (Signed)
?   Related to the angioedema episode, for GI referral, may need EGD?

## 2018-07-25 LAB — FOOD ALLERGY PROFILE
CLASS: 0
CLASS: 0
CLASS: 0
CLASS: 0
CLASS: 0
CLASS: 0
CLASS: 0
CLASS: 0
CLASS: 0
CLASS: 0
CLASS: 0
CLASS: 0
CLASS: 0
Class: 0
Class: 0
Hazelnut: <0.1 kU/L
Milk IgE: <0.1 kU/L
Peanut IgE: <0.1 kU/L
Scallop IgE: <0.1 kU/L
Sesame Seed f10: <0.1 kU/L
Tuna IgE: <0.1 kU/L

## 2018-07-25 LAB — ALLERGY EVALUATION 2, SOUTHEAST
Allergen, A. alternata, m6: <0.1 kU/L
Allergen, Oak,t7: <0.1 kU/L
CLASS: 0
CLASS: 0
CLASS: 0
CLASS: 0
COMMON RAGWEED (SHORT) (W1) IGE: <0.1 kU/L
Class: 0
Class: 0
Class: 0
Class: 0
Class: 0
Class: 0
Dog Dander: <0.1 kU/L
Rough Pigweed  IgE: <0.1 kU/L

## 2018-07-25 LAB — INTERPRETATION:

## 2018-09-26 ENCOUNTER — Encounter: Payer: Self-pay | Admitting: Internal Medicine

## 2018-10-13 ENCOUNTER — Encounter: Payer: Self-pay | Admitting: Gastroenterology

## 2018-10-14 ENCOUNTER — Ambulatory Visit (INDEPENDENT_AMBULATORY_CARE_PROVIDER_SITE_OTHER): Payer: Medicare Other | Admitting: Internal Medicine

## 2018-10-14 ENCOUNTER — Ambulatory Visit (INDEPENDENT_AMBULATORY_CARE_PROVIDER_SITE_OTHER)
Admission: RE | Admit: 2018-10-14 | Discharge: 2018-10-14 | Disposition: A | Discharge status: Home / Self Care | Payer: Medicare Other | Source: Ambulatory Visit | Attending: Internal Medicine | Admitting: Internal Medicine

## 2018-10-14 ENCOUNTER — Ambulatory Visit (INDEPENDENT_AMBULATORY_CARE_PROVIDER_SITE_OTHER)
Admission: RE | Admit: 2018-10-14 | Discharge: 2018-10-14 | Disposition: A | Discharge status: Home / Self Care | Payer: Medicare Other | Source: Ambulatory Visit

## 2018-10-14 ENCOUNTER — Other Ambulatory Visit (INDEPENDENT_AMBULATORY_CARE_PROVIDER_SITE_OTHER): Payer: Medicare Other

## 2018-10-14 ENCOUNTER — Encounter: Payer: Self-pay | Admitting: Internal Medicine

## 2018-10-14 VITALS — BP 126/82 | HR 82 | Temp 98.0°F | Ht 65.0 in | Wt 172.0 lb

## 2018-10-14 DIAGNOSIS — R7302 Impaired glucose tolerance (oral): Secondary | ICD-10-CM

## 2018-10-14 DIAGNOSIS — M2550 Pain in unspecified joint: Secondary | ICD-10-CM

## 2018-10-14 DIAGNOSIS — M25561 Pain in right knee: Secondary | ICD-10-CM

## 2018-10-14 DIAGNOSIS — E785 Hyperlipidemia, unspecified: Secondary | ICD-10-CM

## 2018-10-14 DIAGNOSIS — R202 Paresthesia of skin: Secondary | ICD-10-CM | POA: Diagnosis not present

## 2018-10-14 DIAGNOSIS — E2839 Other primary ovarian failure: Secondary | ICD-10-CM | POA: Diagnosis not present

## 2018-10-14 DIAGNOSIS — Z860100 Personal history of colon polyps, unspecified: Secondary | ICD-10-CM | POA: Insufficient documentation

## 2018-10-14 DIAGNOSIS — E559 Vitamin D deficiency, unspecified: Secondary | ICD-10-CM

## 2018-10-14 DIAGNOSIS — M25562 Pain in left knee: Secondary | ICD-10-CM | POA: Diagnosis not present

## 2018-10-14 LAB — LIPID PANEL
Cholesterol: 178 mg/dL (ref 0–200)
HDL: 46.3 mg/dL (ref >39.00)
LDL Cholesterol: 96 mg/dL (ref 0–99)
NONHDL: 131.85
Total CHOL/HDL Ratio: 4
Triglycerides: 181 mg/dL — ABNORMAL HIGH (ref 0.0–149.0)
VLDL: 36.2 mg/dL (ref 0.0–40.0)

## 2018-10-14 LAB — BASIC METABOLIC PANEL
BUN: 17 mg/dL (ref 6–23)
CO2: 29 mEq/L (ref 19–32)
CREATININE: 0.81 mg/dL (ref 0.40–1.20)
Calcium: 10 mg/dL (ref 8.4–10.5)
Chloride: 100 mEq/L (ref 96–112)
GFR: 70.8 mL/min (ref >60.00)
Glucose, Bld: 98 mg/dL (ref 70–99)
POTASSIUM: 3.8 meq/L (ref 3.5–5.1)
Sodium: 139 mEq/L (ref 135–145)

## 2018-10-14 LAB — CBC WITH DIFFERENTIAL/PLATELET
BASOS ABS: 0 10*3/uL (ref 0.0–0.1)
Basophils Relative: 0.9 % (ref 0.0–3.0)
Eosinophils Absolute: 0.1 10*3/uL (ref 0.0–0.7)
Eosinophils Relative: 1.3 % (ref 0.0–5.0)
HCT: 43 % (ref 36.0–46.0)
HEMOGLOBIN: 14.8 g/dL (ref 12.0–15.0)
LYMPHS PCT: 43.3 % (ref 12.0–46.0)
Lymphs Abs: 2.3 10*3/uL (ref 0.7–4.0)
MCHC: 34.5 g/dL (ref 30.0–36.0)
MCV: 86.2 fl (ref 78.0–100.0)
MONO ABS: 0.4 10*3/uL (ref 0.1–1.0)
Monocytes Relative: 7.1 % (ref 3.0–12.0)
Neutro Abs: 2.5 10*3/uL (ref 1.4–7.7)
Neutrophils Relative %: 47.4 % (ref 43.0–77.0)
Platelets: 189 10*3/uL (ref 150.0–400.0)
RBC: 4.99 Mil/uL (ref 3.87–5.11)
RDW: 13.4 % (ref 11.5–15.5)
WBC: 5.3 10*3/uL (ref 4.0–10.5)

## 2018-10-14 LAB — VITAMIN B12: Vitamin B-12: 659 pg/mL (ref 211–911)

## 2018-10-14 LAB — HEPATIC FUNCTION PANEL
ALK PHOS: 107 U/L (ref 39–117)
ALT: 31 U/L (ref 0–35)
AST: 23 U/L (ref 0–37)
Albumin: 4.6 g/dL (ref 3.5–5.2)
BILIRUBIN DIRECT: 0.1 mg/dL (ref 0.0–0.3)
BILIRUBIN TOTAL: 0.6 mg/dL (ref 0.2–1.2)
Total Protein: 7.5 g/dL (ref 6.0–8.3)

## 2018-10-14 LAB — SEDIMENTATION RATE: SED RATE: 12 mm/h (ref 0–30)

## 2018-10-14 LAB — URINALYSIS, ROUTINE W REFLEX MICROSCOPIC
Bilirubin Urine: NEGATIVE
KETONES UR: NEGATIVE
Nitrite: NEGATIVE
Specific Gravity, Urine: 1.015 (ref 1.000–1.030)
Total Protein, Urine: NEGATIVE
UROBILINOGEN UA: 0.2 (ref 0.0–1.0)
Urine Glucose: NEGATIVE
pH: 6 (ref 5.0–8.0)

## 2018-10-14 LAB — TSH: TSH: 0.96 u[IU]/mL (ref 0.35–4.50)

## 2018-10-14 LAB — VITAMIN D 25 HYDROXY (VIT D DEFICIENCY, FRACTURES): VITD: 61.31 ng/mL (ref 30.00–100.00)

## 2018-10-14 LAB — HEMOGLOBIN A1C: Hgb A1c MFr Bld: 5.9 % (ref 4.6–6.5)

## 2018-10-14 LAB — C-REACTIVE PROTEIN: CRP: <1 mg/dL (ref 0.5–20.0)

## 2018-10-14 LAB — CK: Total CK: 181 U/L — ABNORMAL HIGH (ref 7–177)

## 2018-10-14 MED ORDER — ALPRAZOLAM 1 MG PO TABS: 1.0000 mg | ORAL_TABLET | Freq: Two times a day (BID) | ORAL | 5 refills | Status: DC | PRN

## 2018-10-14 MED ORDER — ATORVASTATIN CALCIUM 80 MG PO TABS: 80.0000 mg | ORAL_TABLET | Freq: Every day | ORAL | 3 refills | Status: DC

## 2018-10-14 MED ORDER — CELECOXIB 200 MG PO CAPS: 200.0000 mg | ORAL_CAPSULE | Freq: Two times a day (BID) | ORAL | 1 refills | Status: DC | PRN

## 2018-10-14 MED ORDER — OMEPRAZOLE 20 MG PO CPDR: DELAYED_RELEASE_CAPSULE | ORAL | 3 refills | Status: DC

## 2018-10-14 NOTE — Progress Notes (Signed)
Subjective:    Patient ID: Molly Lewis, female    DOB: [birth year], 68 y.o.   MRN: [id number]  HPI Here for yearly f/u;  Overall doing ok;  Pt denies Chest pain, worsening SOB, DOE, wheezing, orthopnea, PND, worsening LE edema, palpitations, dizziness or syncope.  Pt denies neurological change such as new headache, facial or extremity weakness.  Pt denies polydipsia, polyuria, or low sugar symptoms. Pt states overall good compliance with treatment and medications, good tolerability, and has been trying to follow appropriate diet.  Pt denies worsening depressive symptoms, suicidal ideation or panic. No fever, night sweats, wt loss, loss of appetite, or other constitutional symptoms.  Pt states good ability with ADL's, has low fall risk, home safety reviewed and adequate, no other significant changes in hearing or vision, and not active with exercise outside of "moving quickly all the time: at work. Due for DXA.    Declined GI referral from last visit as cp/epigastric pain resolved Wt Readings from Last 3 Encounters:  10/14/18 172 lb (78 kg)  07/22/18 169 lb (76.7 kg)  07/16/18 170 lb (77.1 kg)  Does have intermittent joint pains to knees, elbows, hands and wrist, feels "flu like" all over kind joint pains. Taking ibuprofen but trying to cut back   Has been on statin fo 15 yrs without trouble before.  Taking an OTC "joint solution" for 1 wk, not sure if helps.  Does also have numbness to toes, and also cold feeling feet even to her nail person, but she had not realized this before then.  Quit smoking x 10 yrs, no hx of PAD.  No real claudication reproducible to the lower legs and tends to walk fast (I flit around all the time).  Bilat knees have been mod to severe pain at least 3 times in the past several months.  Rest tends make it better.  02/06/2015 left knee film negative for arthritis. Has hx of seeing ortho a few yrs ago with findings of lumbar DJD improved with ESi about 1 yr ago.  Later had  bursitis right hip better with cortisone, but may be trying to come back as well.  Past Medical History:  Diagnosis Date  . Allergic rhinitis, cause unspecified 07/18/2011  . ANXIETY 06/23/2007  . BREAST BIOPSY, HX OF 06/23/2007   benign  . Bursitis   . Cataract   . Chronic back pain   . Chronic constipation   . COLONIC POLYPS, HX OF 06/27/2008  . COMMON MIGRAINE 06/23/2007  . DEPRESSION 06/23/2007  . GERD 06/23/2007  . HYPERLIPIDEMIA 06/23/2007  . Impaired glucose tolerance 02/16/2011  . INSOMNIA-SLEEP DISORDER-UNSPEC 06/26/2009  . Irritable bowel syndrome 06/23/2007  . Microhematuria 07/31/2015  . OSTEOARTHRITIS, LUMBAR SPINE 06/23/2007  . OSTEOPENIA 07/11/2008   Past Surgical History:  Procedure Laterality Date  . APPENDECTOMY    . CATARACT EXTRACTION     BIL  . TOTAL ABDOMINAL HYSTERECTOMY      reports that she quit smoking about 9 years ago. Her smoking use included cigarettes. She has never used smokeless tobacco. She reports that she does not drink alcohol or use drugs. family history includes Brain cancer in her maternal grandmother and mother; Heart disease in her father; Hypertension in her father. Allergies  Allergen Reactions  . Codeine Nausea And Vomiting    Violent vomiting   Current Outpatient Medications on File Prior to Visit  Medication Sig Dispense Refill  . aspirin EC 81 MG tablet Take 81 mg by mouth daily.    Marland Kitchen  Calcium Carbonate-Vitamin D (CALCIUM-D PO) Take 1,200 mg by mouth daily.    Marland Kitchen gabapentin (NEURONTIN) 300 MG capsule Take 1 capsule (300 mg total) by mouth 3 (three) times daily. - to start after the 100 mg treatment (Patient taking differently: Take 300 mg by mouth as needed. - to start after the 100 mg treatment) 90 capsule 5  . metroNIDAZOLE (METROGEL) 0.75 % gel APPLY TO AFFECTED AREA TWICE A DAY  2  . Multiple Vitamin (MULTIVITAMIN WITH MINERALS) TABS tablet Take 1 tablet by mouth daily.    . traMADol (ULTRAM) 50 MG tablet Take 1 tablet (50 mg  total) by mouth 4 (four) times daily as needed. 120 tablet 0   No current facility-administered medications on file prior to visit.    Review of Systems  Constitutional: Negative for other unusual diaphoresis or sweats HENT: Negative for ear discharge or swelling Eyes: Negative for other worsening visual disturbances Respiratory: Negative for stridor or other swelling  Gastrointestinal: Negative for worsening distension or other blood Genitourinary: Negative for retention or other urinary change Musculoskeletal: Negative for other MSK pain or swelling Skin: Negative for color change or other new lesions Neurological: Negative for worsening tremors and other numbness  Psychiatric/Behavioral: Negative for worsening agitation or other fatigue All other system neg per pt    Objective:   Physical Exam BP 126/82   Pulse 82   Temp 98 F (36.7 C) (Oral)   Ht 5\' 5"  (1.651 m)   Wt 172 lb (78 kg)   SpO2 94%   BMI 28.62 kg/m  VS noted, non toxic, and not chronic ill appearing, overweight Constitutional: Pt appears in NAD HENT: Head: NCAT.  Right Ear: External ear normal.  Left Ear: External ear normal.  Eyes: . Pupils are equal, round, and reactive to light. Conjunctivae and EOM are normal Nose: without d/c or deformity Neck: Neck supple. Gross normal ROM Cardiovascular: Normal rate and regular rhythm.   Pulmonary/Chest: Effort normal and breath sounds without rales or wheezing.  Abd:  Soft, NT, ND, + BS, no organomegaly Joint: no synovitis, has degenerative changes to hands and knees without swelling Neurological: Pt is alert. At baseline orientation, motor grossly intact Skin: Skin is warm. No rashes, other new lesions, no LE edema Psychiatric: Pt behavior is normal without agitation  No other exam findings Lab Results  Component Value Date   WBC 4.6 10/13/2017   HGB 15.1 (H) 10/13/2017   HCT 43.5 10/13/2017   PLT 143.0 (L) 10/13/2017   GLUCOSE 98 10/13/2017   CHOL 157  10/13/2017   TRIG 198.0 (H) 10/13/2017   HDL 46.60 10/13/2017   LDLDIRECT 126.0 09/04/2016   LDLCALC 71 10/13/2017   ALT 35 10/13/2017   AST 25 10/13/2017   NA 139 10/13/2017   K 4.3 10/13/2017   CL 101 10/13/2017   CREATININE 0.90 10/13/2017   BUN 18 10/13/2017   CO2 29 10/13/2017   TSH 1.47 10/13/2017   HGBA1C 5.8 10/13/2017       Assessment & Plan:

## 2018-10-14 NOTE — Assessment & Plan Note (Signed)
Etiology unclear, suspect at least OA related pain, cannot r/o other, for labs as ordered, bilat knee films, tumeric otc, and/o glucosamine, celebrex bid prn (stop advil), cont tramadol prn as she gets per ortho, and also for sports medicine referral, consider rheumatology if not improved

## 2018-10-14 NOTE — Assessment & Plan Note (Addendum)
Due for f/u DXA,  to f/u any worsening symptoms or concerns  Note:  Total time for pt hx, exam, review of record with pt in the room, determination of diagnoses and plan for further eval and tx is > 40 min, with over 50% spent in coordination and counseling of patient including the differential dx, tx, further evaluation and other management of estrogen deficiency, history of colon polyps, paresthesia, polyarthralgia, bilateral knee pain, hyperglycemia, HLD,

## 2018-10-14 NOTE — Assessment & Plan Note (Signed)
Go toes, for b12 level with labs

## 2018-10-14 NOTE — Assessment & Plan Note (Signed)
For colonoscopy as she appears to be due

## 2018-10-14 NOTE — Assessment & Plan Note (Signed)
stable overall by history and exam, recent data reviewed with pt, and pt to continue medical treatment as before,  to f/u any worsening symptoms or concerns  

## 2018-10-14 NOTE — Assessment & Plan Note (Signed)
stable overall by history and exam, recent data reviewed with pt, and pt to continue medical treatment as before,  to f/u any worsening symptoms or concerns, for a1c with labs 

## 2018-10-14 NOTE — Assessment & Plan Note (Signed)
Likely OA related, for tx as above, for films today,  to f/u any worsening symptoms or concerns

## 2018-10-14 NOTE — Patient Instructions (Addendum)
Please take all new medication as prescribed - the celebrex as needed  OK to stop the ibuprofen  Ok to take OTC Tumeric and/or glucosamine chondroitin for joint pain  OK to use the tramadol for more severe pain as per your orthopedic  Please continue all other medications as before, and refills have been done if requested.  Please have the pharmacy call with any other refills you may need.  Please continue your efforts at being more active, low cholesterol diet, and weight control.  You are otherwise up to date with prevention measures today.  Please keep your appointments with your specialists as you may have planned  You will be contacted regarding the referral for: Bone Density testing (Please schedule the bone density test before leaving today at the scheduling desk (where you check out)  You will be contacted regarding the referral for: Colonoscopy, and Sports medicine referral  Please go to the XRAY Department in the Basement (go straight as you get off the elevator) for the x-ray testing  Please go to the LAB in the Basement (turn left off the elevator) for the tests to be done today  You will be contacted by phone if any changes need to be made immediately.  Otherwise, you will receive a letter about your results with an explanation, but please check with MyChart first.  Please remember to sign up for MyChart if you have not done so, as this will be important to you in the future with finding out test results, communicating by private email, and scheduling acute appointments online when needed.

## 2018-10-17 LAB — RHEUMATOID FACTOR: Rheumatoid fact SerPl-aCnc: <14 IU/mL (ref <14)

## 2018-10-17 LAB — ANA: Anti Nuclear Antibody(ANA): NEGATIVE

## 2018-11-08 ENCOUNTER — Encounter: Payer: Self-pay | Admitting: Gastroenterology

## 2018-11-14 DIAGNOSIS — H35371 Puckering of macula, right eye: Secondary | ICD-10-CM | POA: Diagnosis not present

## 2018-11-14 DIAGNOSIS — H33301 Unspecified retinal break, right eye: Secondary | ICD-10-CM | POA: Diagnosis not present

## 2018-11-14 DIAGNOSIS — H43811 Vitreous degeneration, right eye: Secondary | ICD-10-CM | POA: Diagnosis not present

## 2018-12-04 NOTE — Progress Notes (Signed)
[facility] [address], [place] [address] Phone: [phone] Subjective:   Fontaine No, am serving as a scribe for Dr. Hulan Saas.  I'm seeing this patient by the request  of:  Molly Borg, MD  CC: Knee pain  [id number]  Molly Lewis is a 68 y.o. female coming in with complaint of polyarthralgia. Patient's knee pain is worse than any other joints. Pain is becoming for frequent and intense. Has not pinpointed anything that makes her worse. Was using IBU and is now using celebrex, tramdol, gabapentin. She has not had relief from any medication that she has tried.  Patient states that the severity as well as the frequency seems to be increasing.  Has the significant debilitating events 2-3 times a month.  Had recent laboratory work-up.  Laboratory work-up showed that patient labs were fairly unremarkable.  Patient is concerned though because she is concerned to increase activity or do other things secondary to the discomfort.  Can bring tears to her eyes.  Tramadol does not seem to be helpful.    Bilateral knee x-rays were done October 14, 2018.  These were independently visualized by me showing no significant bony abnormality.  Past Medical History:  Diagnosis Date  . Allergic rhinitis, cause unspecified 07/18/2011  . ANXIETY 06/23/2007  . BREAST BIOPSY, HX OF 06/23/2007   benign  . Bursitis   . Cataract   . Chronic back pain   . Chronic constipation   . COLONIC POLYPS, HX OF 06/27/2008  . COMMON MIGRAINE 06/23/2007  . DEPRESSION 06/23/2007  . GERD 06/23/2007  . HYPERLIPIDEMIA 06/23/2007  . Impaired glucose tolerance 02/16/2011  . INSOMNIA-SLEEP DISORDER-UNSPEC 06/26/2009  . Irritable bowel syndrome 06/23/2007  . Microhematuria 07/31/2015  . OSTEOARTHRITIS, LUMBAR SPINE 06/23/2007  . OSTEOPENIA 07/11/2008   Past Surgical History:  Procedure Laterality Date  . APPENDECTOMY    . CATARACT EXTRACTION     BIL  . TOTAL  ABDOMINAL HYSTERECTOMY     Social History   Socioeconomic History  . Marital status: Married    Spouse name: Not on file  . Number of children: 2  . Years of education: Not on file  . Highest education level: Not on file  Occupational History  . Not on file  Social Needs  . Financial resource strain: Not on file  . Food insecurity:    Worry: Not on file    Inability: Not on file  . Transportation needs:    Medical: Not on file    Non-medical: Not on file  Tobacco Use  . Smoking status: Former Smoker    Types: Cigarettes    Last attempt to quit: 04/16/2009    Years since quitting: 9.6  . Smokeless tobacco: Never Used  . Tobacco comment: Quit smoking 04/15/09  Substance and Sexual Activity  . Alcohol use: No    Comment: rare,occasional  . Drug use: No  . Sexual activity: Yes  Lifestyle  . Physical activity:    Days per week: Not on file    Minutes per session: Not on file  . Stress: Not on file  Relationships  . Social connections:    Talks on phone: Not on file    Gets together: Not on file    Attends religious service: Not on file    Active member of club or organization: Not on file    Attends meetings of clubs or organizations: Not on file    Relationship status:  Not on file  Other Topics Concern  . Not on file  Social History Narrative  . Not on file   Allergies  Allergen Reactions  . Codeine Nausea And Vomiting    Violent vomiting   Family History  Problem Relation Age of Onset  . Heart disease Father        MI in early 64's  . Hypertension Father   . Brain cancer Mother   . Brain cancer Maternal Grandmother      Current Outpatient Medications (Cardiovascular):  .  atorvastatin (LIPITOR) 80 MG tablet, Take 1 tablet (80 mg total) by mouth daily.   Current Outpatient Medications (Analgesics):  .  aspirin EC 81 MG tablet, Take 81 mg by mouth daily. .  celecoxib (CELEBREX) 200 MG capsule, Take 1 capsule (200 mg total) by mouth 2 (two) times daily as  needed for mild pain or moderate pain. .  traMADol (ULTRAM) 50 MG tablet, Take 1 tablet (50 mg total) by mouth 4 (four) times daily as needed.   Current Outpatient Medications (Other):  Marland Kitchen  ALPRAZolam (XANAX) 1 MG tablet, Take 1 tablet (1 mg total) by mouth 2 (two) times daily as needed. .  Calcium Carbonate-Vitamin D (CALCIUM-D PO), Take 1,200 mg by mouth daily. Marland Kitchen  gabapentin (NEURONTIN) 300 MG capsule, Take 1 capsule (300 mg total) by mouth 3 (three) times daily. - to start after the 100 mg treatment (Patient taking differently: Take 300 mg by mouth as needed. - to start after the 100 mg treatment) .  metroNIDAZOLE (METROGEL) 0.75 % gel, APPLY TO AFFECTED AREA TWICE A DAY .  Multiple Vitamin (MULTIVITAMIN WITH MINERALS) TABS tablet, Take 1 tablet by mouth daily. Marland Kitchen  omeprazole (PRILOSEC) 20 MG capsule, Take 1 capsule by mouth two times daily    Past medical history, social, surgical and family history all reviewed in electronic medical record.  No pertanent information unless stated regarding to the chief complaint.   Review of Systems:  No headache, visual changes, nausea, vomiting, diarrhea, constipation, dizziness, abdominal pain, skin rash, fevers, chills, night sweats, weight loss, swollen lymph nodes, body aches, joint swelling, muscle aches, chest pain, shortness of breath, mood changes.   Objective  Blood pressure 118/86, pulse 61, height 5\' 5"  (1.651 m), weight 172 lb (78 kg), SpO2 98 %.    General: No apparent distress alert and oriented x3 mood and affect normal, dressed appropriately.  HEENT: Pupils equal, extraocular movements intact  Respiratory: Patient's speak in full sentences and does not appear short of breath  Cardiovascular: No lower extremity edema, non tender, no erythema  Skin: Warm dry intact with no signs of infection or rash on extremities or on axial skeleton.  Abdomen: Soft nontender  Neuro: Cranial nerves II through XII are intact, neurovascularly intact in  all extremities with 2+ DTRs and 2+ pulses.  Lymph: No lymphadenopathy of posterior or anterior cervical chain or axillae bilaterally.  Gait normal with good balance and coordination.  MSK:  tender with full range of motion and good stability and symmetric strength and tone of shoulders, elbows, wrist, hip, and ankles bilaterally.  Knee: Bilateral Normal to inspection with no erythema or effusion or obvious bony abnormalities. Palpation normal with no warmth, joint line tenderness, patellar tenderness, or condyle tenderness. ROM full in flexion and extension and lower leg rotation. Ligaments with solid consistent endpoints including ACL, PCL, LCL, MCL. Negative Mcmurray's, Apley's, and Thessalonian tests. Non painful patellar compression. Patellar glide without crepitus. Patellar and quadriceps tendons  unremarkable. Hamstring and quadriceps strength is normal.     Impression and Recommendations:     This case required medical decision making of moderate complexity. The above documentation has been reviewed and is accurate and complete Lyndal Pulley, DO       Note: This dictation was prepared with Dragon dictation along with smaller phrase technology. Any transcriptional errors that result from this process are unintentional.

## 2018-12-05 ENCOUNTER — Encounter: Payer: Self-pay | Admitting: Family Medicine

## 2018-12-05 ENCOUNTER — Other Ambulatory Visit: Payer: Self-pay

## 2018-12-05 ENCOUNTER — Ambulatory Visit (INDEPENDENT_AMBULATORY_CARE_PROVIDER_SITE_OTHER): Payer: Medicare Other | Admitting: Family Medicine

## 2018-12-05 DIAGNOSIS — M2550 Pain in unspecified joint: Secondary | ICD-10-CM

## 2018-12-05 NOTE — Assessment & Plan Note (Signed)
Significant overall.  Discussed with patient and posture and ergonomics.  We discussed different medications.  Patient elected to try more of a conservative approach.  We discussed adding Tylenol and tramadol, over-the-counter natural supplementations, we discussed the potential for even uric acid being elevated.  No need for laboratory work-up at the moment.  Patient will try these changes.  Discussed with patient at great length.  Follow-up with a white back secondary to coronavirus outbreak in 4 weeks.  Spent  25 minutes with patient face-to-face and had greater than 50% of counseling including as described above in assessment and plan.

## 2018-12-05 NOTE — Patient Instructions (Signed)
wonderful to meet you  Ice 20 minutes 2 times daily. Usually after activity and before bed. Turmeric 500mg  twice daily if stomach can tolerate it  Tart cherry extract any dose at night CoQ10 200mg  daily with your statin.  Stay active as much as you can  Also consider magnesium 400mg  at night to help with muscle tightness.  See me again on webEx in 4 weeks

## 2018-12-06 ENCOUNTER — Ambulatory Visit (AMBULATORY_SURGERY_CENTER): Payer: Self-pay

## 2018-12-06 ENCOUNTER — Other Ambulatory Visit: Payer: Self-pay

## 2018-12-06 VITALS — Ht 65.0 in | Wt 170.0 lb

## 2018-12-06 DIAGNOSIS — Z860100 Personal history of colon polyps, unspecified: Secondary | ICD-10-CM

## 2018-12-06 MED ORDER — NA SULFATE-K SULFATE-MG SULF 17.5-3.13-1.6 GM/177ML PO SOLN: 1.0000 | Freq: Once | ORAL | 0 refills | Status: AC

## 2018-12-06 NOTE — Progress Notes (Signed)
Denies allergies to eggs or soy products. Denies complication of anesthesia or sedation. Denies use of weight loss medication. Denies use of O2.   Emmi instructions declined.  Pre-Visit was conducted by phone. Insurance was verified. Instructions were mailed to the patient at [place] [address].  Patient was encouraged to call with any questions or concerns.

## 2018-12-23 ENCOUNTER — Encounter: Payer: Medicare Other | Admitting: Gastroenterology

## 2019-01-02 ENCOUNTER — Ambulatory Visit: Payer: Medicare Other | Admitting: Family Medicine

## 2019-01-06 HISTORY — PX: RETINAL DETACHMENT SURGERY: SHX105

## 2019-01-17 DIAGNOSIS — L821 Other seborrheic keratosis: Secondary | ICD-10-CM | POA: Diagnosis not present

## 2019-01-17 DIAGNOSIS — D1801 Hemangioma of skin and subcutaneous tissue: Secondary | ICD-10-CM | POA: Diagnosis not present

## 2019-01-17 DIAGNOSIS — L812 Freckles: Secondary | ICD-10-CM | POA: Diagnosis not present

## 2019-01-17 DIAGNOSIS — D225 Melanocytic nevi of trunk: Secondary | ICD-10-CM | POA: Diagnosis not present

## 2019-01-17 DIAGNOSIS — L720 Epidermal cyst: Secondary | ICD-10-CM | POA: Diagnosis not present

## 2019-01-17 DIAGNOSIS — L218 Other seborrheic dermatitis: Secondary | ICD-10-CM | POA: Diagnosis not present

## 2019-01-18 ENCOUNTER — Telehealth: Payer: Self-pay | Admitting: *Deleted

## 2019-01-18 NOTE — Telephone Encounter (Signed)
Spoke with pt and procedure scheduled for 03-03-19 at 800.  Pt states she has prep at home still.  New instructions mailed to pt

## 2019-01-20 ENCOUNTER — Encounter: Payer: Medicare Other | Admitting: Gastroenterology

## 2019-01-25 DIAGNOSIS — H35371 Puckering of macula, right eye: Secondary | ICD-10-CM | POA: Diagnosis not present

## 2019-02-02 DIAGNOSIS — Z09 Encounter for follow-up examination after completed treatment for conditions other than malignant neoplasm: Secondary | ICD-10-CM | POA: Diagnosis not present

## 2019-02-02 DIAGNOSIS — H35371 Puckering of macula, right eye: Secondary | ICD-10-CM | POA: Diagnosis not present

## 2019-02-06 HISTORY — PX: OTHER SURGICAL HISTORY: SHX169

## 2019-02-16 DIAGNOSIS — L820 Inflamed seborrheic keratosis: Secondary | ICD-10-CM | POA: Diagnosis not present

## 2019-02-16 DIAGNOSIS — L720 Epidermal cyst: Secondary | ICD-10-CM | POA: Diagnosis not present

## 2019-03-01 DIAGNOSIS — Z124 Encounter for screening for malignant neoplasm of cervix: Secondary | ICD-10-CM | POA: Diagnosis not present

## 2019-03-01 DIAGNOSIS — Z1231 Encounter for screening mammogram for malignant neoplasm of breast: Secondary | ICD-10-CM | POA: Diagnosis not present

## 2019-03-01 DIAGNOSIS — N952 Postmenopausal atrophic vaginitis: Secondary | ICD-10-CM | POA: Diagnosis not present

## 2019-03-02 ENCOUNTER — Telehealth: Payer: Self-pay | Admitting: Gastroenterology

## 2019-03-02 NOTE — Telephone Encounter (Signed)
Spoke w/patient regarding Covid-19 questions Covid-19 Screening Questions:  Do you now or have you had a fever in the last 14 days? no  Do you have any respiratory symptoms of shortness of breath or cough now or in the last 14 days? no  Do you have any family members or close contacts with diagnosed or suspected Covid-19 in the past 14 days? no   Have you been tested for Covid-19 and found to be positive? no  Pt made aware of that care partner may wait in the car or come up to the lobby during the procedure but will need to provide their own mask.

## 2019-03-03 ENCOUNTER — Encounter: Payer: Self-pay | Admitting: Gastroenterology

## 2019-03-03 ENCOUNTER — Other Ambulatory Visit: Payer: Self-pay

## 2019-03-03 ENCOUNTER — Ambulatory Visit (AMBULATORY_SURGERY_CENTER): Payer: Medicare Other | Admitting: Gastroenterology

## 2019-03-03 VITALS — BP 120/78 | HR 72 | Temp 98.9°F | Resp 10 | Ht 65.0 in | Wt 172.0 lb

## 2019-03-03 DIAGNOSIS — K635 Polyp of colon: Secondary | ICD-10-CM | POA: Diagnosis not present

## 2019-03-03 DIAGNOSIS — D124 Benign neoplasm of descending colon: Secondary | ICD-10-CM | POA: Diagnosis not present

## 2019-03-03 DIAGNOSIS — Z86010 Personal history of colonic polyps: Secondary | ICD-10-CM | POA: Diagnosis not present

## 2019-03-03 DIAGNOSIS — D123 Benign neoplasm of transverse colon: Secondary | ICD-10-CM

## 2019-03-03 MED ORDER — SODIUM CHLORIDE 0.9 % IV SOLN: 500.0000 mL | Freq: Once | INTRAVENOUS | Status: DC

## 2019-03-03 NOTE — Progress Notes (Signed)
Pt drowsy. VSS. Reprot given to RN. NO anesthetic complications noted

## 2019-03-03 NOTE — Progress Notes (Signed)
Called to room to assist during endoscopic procedure.  Patient ID and intended procedure confirmed with present staff. Received instructions for my participation in the procedure from the performing physician.  

## 2019-03-03 NOTE — Progress Notes (Signed)
Pt's states no medical or surgical changes since previsit or office visit. 

## 2019-03-03 NOTE — Op Note (Signed)
[facility] Patient Name: Molly [facility] Procedure Date: 03/03/2019 8:11 AM MRN: [id number] Endoscopist: Mauri Pole , MD Age: 68 Referring MD:  Date of Birth: 1950-10-07 Gender: Female Account #: [id number] Procedure:                Colonoscopy Indications:              High risk colon cancer surveillance: Personal                            history of colonic polyps Medicines:                Monitored Anesthesia Care Procedure:                Pre-Anesthesia Assessment:                           - Prior to the procedure, a History and Physical                            was performed, and patient medications and                            allergies were reviewed. The patient's tolerance of                            previous anesthesia was also reviewed. The risks                            and benefits of the procedure and the sedation                            options and risks were discussed with the patient.                            All questions were answered, and informed consent                            was obtained. Prior Anticoagulants: The patient has                            taken no previous anticoagulant or antiplatelet                            agents. ASA Grade Assessment: II - A patient with                            mild systemic disease. After reviewing the risks                            and benefits, the patient was deemed in                            satisfactory condition to undergo the procedure.  After obtaining informed consent, the colonoscope                            was passed under direct vision. Throughout the                            procedure, the patient's blood pressure, pulse, and                            oxygen saturations were monitored continuously. The                            Model PCF-[id number]) scope was introduced                            through the anus and advanced to  the the cecum,                            identified by appendiceal orifice and ileocecal                            valve. The colonoscopy was performed without                            difficulty. The patient tolerated the procedure                            well. The quality of the bowel preparation was                            excellent. The ileocecal valve, appendiceal                            orifice, and rectum were photographed. Scope In: 8:19:11 AM Scope Out: 8:43:39 AM Scope Withdrawal Time: 0 hours 10 minutes 8 seconds  Total Procedure Duration: 0 hours 24 minutes 28 seconds  Findings:                 The perianal and digital rectal examinations were                            normal.                           Three sessile polyps were found in the descending                            colon and transverse colon. The polyps were 5 to 10                            mm in size. These polyps were removed with a cold                            snare. Resection and retrieval were complete.  A 25 mm polyp was found in the ascending colon. The                            polyp was granular lateral spreading. EMR was not                            performed                           Scattered small and large-mouthed diverticula were                            found in the sigmoid colon and descending colon.                           Non-bleeding internal hemorrhoids were found during                            retroflexion. The hemorrhoids were small. Complications:            No immediate complications. Estimated Blood Loss:     Estimated blood loss was minimal. Impression:               - Three 5 to 10 mm polyps in the descending colon                            and in the transverse colon, removed with a cold                            snare. Resected and retrieved.                           - One 25 mm polyp in the ascending colon.                            - Moderate diverticulosis in the sigmoid colon and                            in the descending colon.                           - Non-bleeding internal hemorrhoids. Recommendation:           - Patient has a contact number available for                            emergencies. The signs and symptoms of potential                            delayed complications were discussed with the                            patient. Return to normal activities tomorrow.  Written discharge instructions were provided to the                            patient.                           - Resume previous diet.                           - Continue present medications.                           - Await pathology results.                           - Repeat colonoscopy for EMR at the next available                            appointment at hospital. Mauri Pole, MD 03/03/2019 8:54:28 AM This report has been signed electronically.

## 2019-03-03 NOTE — Patient Instructions (Signed)
YOU HAD AN ENDOSCOPIC PROCEDURE TODAY AT THE [facility]:   Refer to the procedure report that was given to you for any specific questions about what was found during the examination.  If the procedure report does not answer your questions, please call your gastroenterologist to clarify.  If you requested that your care partner not be given the details of your procedure findings, then the procedure report has been included in a sealed envelope for you to review at your convenience later.  YOU SHOULD EXPECT: Some feelings of bloating in the abdomen. Passage of more gas than usual.  Walking can help get rid of the air that was put into your GI tract during the procedure and reduce the bloating. If you had a lower endoscopy (such as a colonoscopy or flexible sigmoidoscopy) you may notice spotting of blood in your stool or on the toilet paper. If you underwent a bowel prep for your procedure, you may not have a normal bowel movement for a few days.  Please Note:  You might notice some irritation and congestion in your nose or some drainage.  This is from the oxygen used during your procedure.  There is no need for concern and it should clear up in a day or so.  SYMPTOMS TO REPORT IMMEDIATELY:   Following lower endoscopy (colonoscopy or flexible sigmoidoscopy):  Excessive amounts of blood in the stool  Significant tenderness or worsening of abdominal pains  Swelling of the abdomen that is new, acute  Fever of 100F or higher   For urgent or emergent issues, a gastroenterologist can be reached at any hour by calling [phone].   DIET:  We do recommend a small meal at first, but then you may proceed to your regular diet.  Drink plenty of fluids but you should avoid alcoholic beverages for 24 hours.  MEDICATIONS: Continue present medications.  Please see handouts given to you by your recovery nurse.  ACTIVITY:  You should plan to take it easy for the rest of today and you should  NOT DRIVE or use heavy machinery until tomorrow (because of the sedation medicines used during the test).    FOLLOW UP: Our staff will call the number listed on your records 48-72 hours following your procedure to check on you and address any questions or concerns that you may have regarding the information given to you following your procedure. If we do not reach you, we will leave a message.  We will attempt to reach you two times.  During this call, we will ask if you have developed any symptoms of COVID 19. If you develop any symptoms (ie: fever, flu-like symptoms, shortness of breath, cough etc.) before then, please call [phone].  If you test positive for Covid 19 in the 2 weeks post procedure, please call and report this information to us.    If any biopsies were taken you will be contacted by phone or by letter within the next 1-3 weeks.  Please call us at [phone] if you have not heard about the biopsies in 3 weeks.   Thank you for allowing us to provide for your healthcare needs today.   SIGNATURES/CONFIDENTIALITY: You and/or your care partner have signed paperwork which will be entered into your electronic medical record.  These signatures attest to the fact that that the information above on your After Visit Summary has been reviewed and is understood.  Full responsibility of the confidentiality of this discharge information lies with you and/or   your care-partner. 

## 2019-03-07 ENCOUNTER — Telehealth: Payer: Self-pay

## 2019-03-07 NOTE — Telephone Encounter (Signed)
Dr Rush Landmark please see Dr Woodward Ku procedure note.  She states pt is to have colon EMR at next available.  Please advise

## 2019-03-07 NOTE — Telephone Encounter (Signed)
  Follow up Call-  Call back number 03/03/2019  Post procedure Call Back phone  # [phone]  Permission to leave phone message Yes  Some recent data might be hidden     Patient questions:  Do you have a fever, pain , or abdominal swelling? No. Pain Score  0 *  Have you tolerated food without any problems? Yes.    Have you been able to return to your normal activities? Yes.    Do you have any questions about your discharge instructions: Diet   No. Medications  No. Follow up visit  No.  Do you have questions or concerns about your Care? No.  Actions: * If pain score is 4 or above: No action needed, pain <4.  1. Have you developed a fever since your procedure? no  2.   Have you had an respiratory symptoms (SOB or cough) since your procedure? no  3.   Have you tested positive for COVID 19 since your procedure no  4.   Have you had any family members/close contacts diagnosed with the COVID 19 since your procedure?  no   If yes to any of these questions please route to Joylene John, RN and Alphonsa Gin, Therapist, sports.

## 2019-03-07 NOTE — Telephone Encounter (Signed)
I discussed with patient post colonoscopy regarding EMR, she is expecting the procedure and I think she will be fine with scheduling it.  Thank you

## 2019-03-07 NOTE — Telephone Encounter (Signed)
Patty, Please proceed with scheduling a Televisit or Inperson Visit to discuss EMR. Go ahead and look for an EMR date 90 minute slot and you can put it in the books if she desires, or if she wants to talk about it before scheduling that is fine, please let me know. Thanks. GM

## 2019-03-07 NOTE — Telephone Encounter (Signed)
Thanks [name] for update. Molly Lewis, go ahead and find her a date for EMR and then just get me a visit with her before to discuss things further. Thanks. GM

## 2019-03-08 ENCOUNTER — Other Ambulatory Visit: Payer: Self-pay

## 2019-03-08 DIAGNOSIS — Z86010 Personal history of colonic polyps: Secondary | ICD-10-CM

## 2019-03-08 MED ORDER — PEG 3350-KCL-NA BICARB-NACL 420 G PO SOLR: 4000.0000 mL | Freq: Once | ORAL | 0 refills | Status: AC

## 2019-03-08 NOTE — Telephone Encounter (Signed)
Thank you :)

## 2019-03-08 NOTE — Telephone Encounter (Signed)
Colon EMR scheduled, pt instructed and medications reviewed.  Patient instructions mailed to home.  Patient to call with any questions or concerns.  

## 2019-03-08 NOTE — Telephone Encounter (Signed)
Left message on machine to call back  

## 2019-03-15 NOTE — Telephone Encounter (Signed)
No answer

## 2019-03-17 ENCOUNTER — Encounter: Payer: Self-pay | Admitting: Gastroenterology

## 2019-03-23 DIAGNOSIS — Z09 Encounter for follow-up examination after completed treatment for conditions other than malignant neoplasm: Secondary | ICD-10-CM | POA: Diagnosis not present

## 2019-03-23 DIAGNOSIS — H35371 Puckering of macula, right eye: Secondary | ICD-10-CM | POA: Diagnosis not present

## 2019-03-25 ENCOUNTER — Other Ambulatory Visit (HOSPITAL_COMMUNITY)
Admission: RE | Admit: 2019-03-25 | Discharge: 2019-03-25 | Disposition: A | Discharge status: Home / Self Care | Payer: Medicare Other | Source: Ambulatory Visit | Attending: Gastroenterology | Admitting: Gastroenterology

## 2019-03-25 DIAGNOSIS — Z1159 Encounter for screening for other viral diseases: Secondary | ICD-10-CM | POA: Diagnosis not present

## 2019-03-25 LAB — SARS CORONAVIRUS 2 (TAT 6-24 HRS): SARS Coronavirus 2: NEGATIVE

## 2019-03-28 ENCOUNTER — Other Ambulatory Visit: Payer: Self-pay

## 2019-03-28 ENCOUNTER — Encounter (HOSPITAL_COMMUNITY): Payer: Self-pay | Admitting: Emergency Medicine

## 2019-03-28 NOTE — Progress Notes (Signed)
Pre-op endo cal complete

## 2019-03-29 ENCOUNTER — Ambulatory Visit (HOSPITAL_COMMUNITY): Payer: Medicare Other | Admitting: Anesthesiology

## 2019-03-29 ENCOUNTER — Encounter (HOSPITAL_COMMUNITY)
Admission: RE | Disposition: A | Discharge status: Home / Self Care | Payer: Self-pay | Source: Home / Self Care | Attending: Gastroenterology

## 2019-03-29 ENCOUNTER — Ambulatory Visit (HOSPITAL_COMMUNITY)
Admission: RE | Admit: 2019-03-29 | Discharge: 2019-03-29 | Disposition: A | Discharge status: Home / Self Care | Payer: Medicare Other | Attending: Gastroenterology | Admitting: Gastroenterology

## 2019-03-29 ENCOUNTER — Other Ambulatory Visit: Payer: Self-pay

## 2019-03-29 ENCOUNTER — Encounter (HOSPITAL_COMMUNITY): Payer: Self-pay | Admitting: *Deleted

## 2019-03-29 DIAGNOSIS — K635 Polyp of colon: Secondary | ICD-10-CM

## 2019-03-29 DIAGNOSIS — E785 Hyperlipidemia, unspecified: Secondary | ICD-10-CM | POA: Diagnosis not present

## 2019-03-29 DIAGNOSIS — D122 Benign neoplasm of ascending colon: Secondary | ICD-10-CM | POA: Insufficient documentation

## 2019-03-29 DIAGNOSIS — Z86010 Personal history of colonic polyps: Secondary | ICD-10-CM | POA: Diagnosis not present

## 2019-03-29 DIAGNOSIS — K640 First degree hemorrhoids: Secondary | ICD-10-CM | POA: Diagnosis not present

## 2019-03-29 DIAGNOSIS — K644 Residual hemorrhoidal skin tags: Secondary | ICD-10-CM | POA: Diagnosis not present

## 2019-03-29 DIAGNOSIS — F418 Other specified anxiety disorders: Secondary | ICD-10-CM | POA: Diagnosis not present

## 2019-03-29 DIAGNOSIS — Z87891 Personal history of nicotine dependence: Secondary | ICD-10-CM | POA: Insufficient documentation

## 2019-03-29 HISTORY — PX: HEMOSTASIS CLIP PLACEMENT: SHX6857

## 2019-03-29 HISTORY — PX: SUBMUCOSAL LIFTING INJECTION: SHX6855

## 2019-03-29 HISTORY — PX: ENDOSCOPIC MUCOSAL RESECTION: SHX6839

## 2019-03-29 HISTORY — PX: COLONOSCOPY WITH PROPOFOL: SHX5780

## 2019-03-29 SURGERY — COLONOSCOPY WITH PROPOFOL
Anesthesia: Monitor Anesthesia Care

## 2019-03-29 MED ORDER — LABETALOL HCL 5 MG/ML IV SOLN
INTRAVENOUS | Status: DC | PRN
  Administered 2019-03-29: 5 mg via INTRAVENOUS

## 2019-03-29 MED ORDER — PROPOFOL 10 MG/ML IV BOLUS
INTRAVENOUS | Status: DC | PRN
  Administered 2019-03-29: 75 mg via INTRAVENOUS

## 2019-03-29 MED ORDER — GLYCOPYRROLATE 0.2 MG/ML IJ SOLN
INTRAMUSCULAR | Status: DC | PRN
  Administered 2019-03-29: 0.2 mg via INTRAVENOUS

## 2019-03-29 MED ORDER — LACTATED RINGERS IV SOLN
INTRAVENOUS | Status: DC
  Administered 2019-03-29: 12:00:00 via INTRAVENOUS
  Administered 2019-03-29: 1000 mL via INTRAVENOUS

## 2019-03-29 MED ORDER — SPOT INK MARKER SYRINGE KIT
PACK | SUBMUCOSAL | Status: AC
  Filled 2019-03-29: qty 5

## 2019-03-29 MED ORDER — PROPOFOL 10 MG/ML IV BOLUS
INTRAVENOUS | Status: AC
  Filled 2019-03-29: qty 20

## 2019-03-29 MED ORDER — PROPOFOL 10 MG/ML IV BOLUS
INTRAVENOUS | Status: AC
  Filled 2019-03-29: qty 60

## 2019-03-29 MED ORDER — GLUCAGON HCL RDNA (DIAGNOSTIC) 1 MG IJ SOLR
INTRAMUSCULAR | Status: AC
  Filled 2019-03-29: qty 1

## 2019-03-29 MED ORDER — PROPOFOL 500 MG/50ML IV EMUL
INTRAVENOUS | Status: DC | PRN
  Administered 2019-03-29: 150 ug/kg/min via INTRAVENOUS

## 2019-03-29 MED ORDER — SODIUM CHLORIDE 0.9 % IV SOLN: INTRAVENOUS | Status: DC

## 2019-03-29 MED ORDER — EPHEDRINE SULFATE-NACL 50-0.9 MG/10ML-% IV SOSY
PREFILLED_SYRINGE | INTRAVENOUS | Status: DC | PRN
  Administered 2019-03-29: 5 mg via INTRAVENOUS

## 2019-03-29 SURGICAL SUPPLY — 22 items

## 2019-03-29 NOTE — Anesthesia Procedure Notes (Signed)
Procedure Name: MAC Date/Time: 03/29/2019 10:51 AM Performed by: Lissa Morales, CRNA Pre-anesthesia Checklist: Patient identified, Emergency Drugs available, Suction available, Patient being monitored and Timeout performed Patient Re-evaluated:Patient Re-evaluated prior to induction Oxygen Delivery Method: Simple face mask Dental Injury: Teeth and Oropharynx as per pre-operative assessment

## 2019-03-29 NOTE — Anesthesia Preprocedure Evaluation (Addendum)
Anesthesia Evaluation  Patient identified by MRN, date of birth, ID band Patient awake    Reviewed: Allergy & Precautions, NPO status , Patient's Chart, lab work & pertinent test results  Airway Mallampati: II  TM Distance: >3 FB Neck ROM: Full    Dental no notable dental hx. (+) Teeth Intact   Pulmonary former smoker,    Pulmonary exam normal breath sounds clear to auscultation       Cardiovascular Exercise Tolerance: Good negative cardio ROS Normal cardiovascular exam Rhythm:Regular Rate:Normal     Neuro/Psych  Headaches, PSYCHIATRIC DISORDERS Anxiety    GI/Hepatic negative GI ROS, Neg liver ROS,   Endo/Other  negative endocrine ROS  Renal/GU negative Renal ROS     Musculoskeletal   Abdominal   Peds  Hematology   Anesthesia Other Findings   Reproductive/Obstetrics                            Anesthesia Physical Anesthesia Plan  ASA: II  Anesthesia Plan: MAC   Post-op Pain Management:    Induction: Intravenous  PONV Risk Score and Plan: Treatment may vary due to age or medical condition  Airway Management Planned: Natural Airway and Nasal Cannula  Additional Equipment:   Intra-op Plan:   Post-operative Plan:   Informed Consent: I have reviewed the patients History and Physical, chart, labs and discussed the procedure including the risks, benefits and alternatives for the proposed anesthesia with the patient or authorized representative who has indicated his/her understanding and acceptance.       Plan Discussed with:   Anesthesia Plan Comments:        Anesthesia Quick Evaluation

## 2019-03-29 NOTE — Anesthesia Postprocedure Evaluation (Signed)
Anesthesia Post Note  Patient: Molly Lewis  Procedure(s) Performed: COLONOSCOPY WITH PROPOFOL (N/A ) ENDOSCOPIC MUCOSAL RESECTION (N/A ) SUBMUCOSAL LIFTING INJECTION HEMOSTASIS CLIP PLACEMENT     Patient location during evaluation: Endoscopy Anesthesia Type: MAC Level of consciousness: awake and alert Pain management: pain level controlled Vital Signs Assessment: post-procedure vital signs reviewed and stable Respiratory status: spontaneous breathing, nonlabored ventilation, respiratory function stable and patient connected to nasal cannula oxygen Cardiovascular status: blood pressure returned to baseline and stable Postop Assessment: no apparent nausea or vomiting Anesthetic complications: no    Last Vitals:  Vitals:   03/29/19 1230 03/29/19 1240  BP: (!) 144/68 (!) 135/59  Pulse: 68 64  Resp: 14 15  Temp:    SpO2: 100% 99%    Last Pain:  Vitals:   03/29/19 1216  TempSrc: Temporal  PainSc:                  [place]

## 2019-03-29 NOTE — Discharge Instructions (Addendum)
YOU HAD AN ENDOSCOPIC PROCEDURE TODAY: Refer to the procedure report and other information in the discharge instructions given to you for any specific questions about what was found during the examination. If this information does not answer your questions, please call [facility] office at [phone] to clarify.   YOU SHOULD EXPECT: Some feelings of bloating in the abdomen. Passage of more gas than usual. Walking can help get rid of the air that was put into your GI tract during the procedure and reduce the bloating. If you had a lower endoscopy (such as a colonoscopy or flexible sigmoidoscopy) you may notice spotting of blood in your stool or on the toilet paper. Some abdominal soreness may be present for a day or two, also.  DIET: Your first meal following the procedure should be a light meal and then it is ok to progress to your normal diet. A half-sandwich or bowl of soup is an example of a good first meal. Heavy or fried foods are harder to digest and may make you feel nauseous or bloated. Drink plenty of fluids but you should avoid alcoholic beverages for 24 hours. If you had a esophageal dilation, please see attached instructions for diet.    ACTIVITY: Your care partner should take you home directly after the procedure. You should plan to take it easy, moving slowly for the rest of the day. You can resume normal activity the day after the procedure however YOU SHOULD NOT DRIVE, use power tools, machinery or perform tasks that involve climbing or major physical exertion for 24 hours (because of the sedation medicines used during the test).   SYMPTOMS TO REPORT IMMEDIATELY: A gastroenterologist can be reached at any hour. Please call [phone]  for any of the following symptoms:  Following lower endoscopy (colonoscopy, flexible sigmoidoscopy) Excessive amounts of blood in the stool  Significant tenderness, worsening of abdominal pains  Swelling of the abdomen that is new, acute  Fever of 100 or  higher    FOLLOW UP:  If any biopsies were taken you will be contacted by phone or by letter within the next 1-3 weeks. Call [phone]  if you have not heard about the biopsies in 3 weeks.  Please also call with any specific questions about appointments or follow up tests. Hold Aspirin for 1-week post-procedure (Can restart on 7/30) to decrease risk of post-mucosectomy bleeding.

## 2019-03-29 NOTE — H&P (Signed)
GASTROENTEROLOGY OUTPATIENT PROCEDURE H&P NOTE   Primary Care Physician: Biagio Borg, MD  HPI: Molly Lewis is a 68 y.o. female who presents for Colonoscopy with possible Colonoscopy with EMR attempt.  Past Medical History:  Diagnosis Date  . Allergic rhinitis, cause unspecified 07/18/2011  . Allergy   . ANXIETY 06/23/2007  . BREAST BIOPSY, HX OF 06/23/2007   benign  . Bursitis   . Cataract   . Chronic back pain   . Chronic constipation    "i have had it all my life and the last time i had a colonoscopy they tol me i have a distened colon because of it"   . COLONIC POLYPS, HX OF 06/27/2008  . COMMON MIGRAINE 06/23/2007  . DEPRESSION 06/23/2007  . GERD 06/23/2007  . HYPERLIPIDEMIA 06/23/2007  . Impaired glucose tolerance 02/16/2011  . INSOMNIA-SLEEP DISORDER-UNSPEC 06/26/2009  . Irritable bowel syndrome 06/23/2007  . Microhematuria 07/31/2015  . OSTEOARTHRITIS, LUMBAR SPINE 06/23/2007  . OSTEOPENIA 07/11/2008   Past Surgical History:  Procedure Laterality Date  . APPENDECTOMY    . CATARACT EXTRACTION     BIL  . colonoscopy  02/2019  . RETINAL DETACHMENT SURGERY Right 01/2019  . RETINAL TEAR REPAIR CRYOTHERAPY Right   . TOTAL ABDOMINAL HYSTERECTOMY     Current Facility-Administered Medications  Medication Dose Route Frequency Provider Last Rate Last Dose  . 0.9 %  sodium chloride infusion   Intravenous Continuous Mansouraty, Telford Nab., MD      . lactated ringers infusion   Intravenous Continuous Mansouraty, Telford Nab., MD 125 mL/hr at 03/29/19 1017 1,000 mL at 03/29/19 1017   Allergies  Allergen Reactions  . Codeine Nausea And Vomiting    Violent vomiting   Family History  Problem Relation Age of Onset  . Heart disease Father        MI in early 82's  . Hypertension Father   . Brain cancer Mother   . Brain cancer Maternal Grandmother   . Colon cancer Neg Hx   . Esophageal cancer Neg Hx   . Rectal cancer Neg Hx   . Stomach cancer Neg Hx    Social  History   Socioeconomic History  . Marital status: Married    Spouse name: Not on file  . Number of children: 2  . Years of education: Not on file  . Highest education level: Not on file  Occupational History  . Not on file  Social Needs  . Financial resource strain: Not on file  . Food insecurity    Worry: Not on file    Inability: Not on file  . Transportation needs    Medical: Not on file    Non-medical: Not on file  Tobacco Use  . Smoking status: Former Smoker    Types: Cigarettes    Quit date: 04/16/2009    Years since quitting: 9.9  . Smokeless tobacco: Never Used  . Tobacco comment: Quit smoking 04/15/09  Substance and Sexual Activity  . Alcohol use: No    Comment: rare,occasional  . Drug use: No  . Sexual activity: Yes  Lifestyle  . Physical activity    Days per week: Not on file    Minutes per session: Not on file  . Stress: Not on file  Relationships  . Social Herbalist on phone: Not on file    Gets together: Not on file    Attends religious service: Not on file    Active member of club  or organization: Not on file    Attends meetings of clubs or organizations: Not on file    Relationship status: Not on file  . Intimate partner violence    Fear of current or ex partner: Not on file    Emotionally abused: Not on file    Physically abused: Not on file    Forced sexual activity: Not on file  Other Topics Concern  . Not on file  Social History Narrative  . Not on file    Physical Exam: Vital signs in last 24 hours: Temp:  [97.8 F (36.6 C)] 97.8 F (36.6 C) (07/22 1007) Pulse Rate:  [91] 91 (07/22 1007) Resp:  [21] 21 (07/22 1007) BP: (172)/(96) 172/96 (07/22 1007) SpO2:  [99 %] 99 % (07/22 1007) Weight:  [76.2 kg-77.1 kg] 76.2 kg (07/22 1007)   GEN: NAD EYE: Sclerae anicteric ENT: MMM CV: RR without R/Gs  RESP: CTAB posteriorly GI: Soft, NT/ND NEURO:  Alert & Oriented x 3  Lab Results: No results for input(s): WBC, HGB, HCT, PLT  in the last 72 hours. BMET No results for input(s): NA, K, CL, CO2, GLUCOSE, BUN, CREATININE, CALCIUM in the last 72 hours. LFT No results for input(s): PROT, ALBUMIN, AST, ALT, ALKPHOS, BILITOT, BILIDIR, IBILI in the last 72 hours. PT/INR No results for input(s): LABPROT, INR in the last 72 hours.   Impression / Plan: This is a 68 y.o.female  who presents for Colonoscopy with possible Colonoscopy with EMR attempt.  The risks and benefits of endoscopic evaluation were discussed with the patient; these include but are not limited to the risk of perforation, infection, bleeding, missed lesions, lack of diagnosis, severe illness requiring hospitalization, as well as anesthesia and sedation related illnesses.  The patient is agreeable to proceed.    Justice Britain, MD [facility] Office # [phone]

## 2019-03-29 NOTE — Op Note (Addendum)
[facility] Patient Name: Molly Lewis Procedure Date: 03/29/2019 MRN: [id number] Attending MD: Justice Britain , MD Date of Birth: [birth year] CSN: [id number] Age: 68 Admit Type: Outpatient Procedure:                Colonoscopy Indications:              Excision of colonic polyp Providers:                Justice Britain, MD, Cleda Daub, RN, Elspeth Cho Tech., Technician, Enrigue Catena, CRNA Referring MD:             Mauri Pole, MD, Biagio Borg, MD Medicines:                Monitored Anesthesia Care Complications:            No immediate complications. Estimated Blood Loss:     Estimated blood loss: none. Procedure:                Pre-Anesthesia Assessment:                           - Prior to the procedure, a History and Physical                            was performed, and patient medications and                            allergies were reviewed. The patient's tolerance of                            previous anesthesia was also reviewed. The risks                            and benefits of the procedure and the sedation                            options and risks were discussed with the patient.                            All questions were answered, and informed consent                            was obtained. Prior Anticoagulants: The patient has                            taken no previous anticoagulant or antiplatelet                            agents except for aspirin. ASA Grade Assessment: II                            - A patient with mild systemic disease. After  reviewing the risks and benefits, the patient was                            deemed in satisfactory condition to undergo the                            procedure.                           After obtaining informed consent, the colonoscope                            was passed under direct vision. Throughout the                  procedure, the patient's blood pressure, pulse, and                            oxygen saturations were monitored continuously. The                            CF-HQ190L ([id number]) Olympus colonoscope was                            introduced through the anus and advanced to the the                            descending colon. The [id number]-[id number] ([id number]) Olympus                            pediatric colonscope was introduced through the                            anus and advanced to the the cecum, identified by                            appendiceal orifice and ileocecal valve. The                            colonoscopy was technically difficult and complex                            due to restricted mobility of the colon. Successful                            completion of the procedure was aided by                            withdrawing and reinserting the scope, withdrawing                            the scope and replacing with the pediatric                            colonoscope, straightening and shortening the scope  to obtain bowel loop reduction and using scope                            torsion. The patient tolerated the procedure. The                            quality of the bowel preparation was good. The                            ileocecal valve, appendiceal orifice, and rectum                            were photographed. Scope In: 11:09:51 AM Scope Out: 12:00:07 PM Scope Withdrawal Time: 0 hours 28 minutes 22 seconds  Total Procedure Duration: 0 hours 50 minutes 16 seconds  Findings:      A 25 mm polyp was found in the proximal ascending colon. The polyp was       semi-sessile. Preparations were made for mucosal resection. Orise gel       was injected to raise the lesion. Snare mucosal resection was performed.       Resection and retrieval were complete. Coagulation for tissue       destruction using snare tip to the edge to decrease  risk of recurrent       tissue was successful. To close the defect after mucosal resection, six       hemostatic clips were successfully placed (MR conditional). There was no       bleeding during, or at the end, of the procedure.      Non-bleeding non-thrombosed external and internal hemorrhoids were found       during retroflexion, during perianal exam and during digital exam. The       hemorrhoids were Grade I (internal hemorrhoids that do not prolapse).      The rest of the colon was grossly normal, but not visualized in its       entirety due to recent full colonoscopy and the intention of today's       procedure. Impression:               - One 25 mm polyp in the proximal ascending colon,                            removed with mucosal resection. Resected and                            retrieved. Treated with a hot snare tip to edge to                            decrease recurrence risk. Clips (MR conditional)                            were placed to close defect.                           - Non-bleeding non-thrombosed external and internal  hemorrhoids. Moderate Sedation:      Not Applicable - Patient had care per Anesthesia. Recommendation:           - The patient will be observed post-procedure,                            until all discharge criteria are met.                           - Discharge patient to home.                           - Patient has a contact number available for                            emergencies. The signs and symptoms of potential                            delayed complications were discussed with the                            patient. Return to normal activities tomorrow.                            Written discharge instructions were provided to the                            patient.                           - Resume previous diet.                           - Hold aspirin for 1-week post procedure to                             decrease risk of post-mucosectomy bleeding.                           - Await pathology results.                           - If the pathology indicates a negative margin,                            would recommend a surveillance Colonoscopy in                            1-year otherwise will plan a 64-monthfollow up                            Colonoscopy for surveillance purpsoes. Use of the                            pediatric scope will aid in future procedures.                           -  The findings and recommendations were discussed                            with the patient.                           - The findings and recommendations were discussed                            with the patient's family. Procedure Code(s):        --- Professional ---                           [phone], Colonoscopy, flexible; with endoscopic                            mucosal resection Diagnosis Code(s):        --- Professional ---                           K63.5, Polyp of colon                           K64.0, First degree hemorrhoids CPT copyright 2019 American Medical Association. All rights reserved. The codes documented in this report are preliminary and upon coder review may  be revised to meet current compliance requirements. Justice Britain, MD 03/29/2019 12:21:36 PM Number of Addenda: 0

## 2019-03-29 NOTE — Transfer of Care (Signed)
Immediate Anesthesia Transfer of Care Note  Patient: Molly Lewis  Procedure(s) Performed: COLONOSCOPY WITH PROPOFOL (N/A ) ENDOSCOPIC MUCOSAL RESECTION (N/A ) SUBMUCOSAL LIFTING INJECTION HEMOSTASIS CLIP PLACEMENT  Patient Location: PACU  Anesthesia Type:MAC  Level of Consciousness: awake, alert , oriented and patient cooperative  Airway & Oxygen Therapy: Patient Spontanous Breathing and Patient connected to face mask oxygen  Post-op Assessment: Report given to RN, Post -op Vital signs reviewed and stable and Patient moving all extremities X 4  Post vital signs: stable  Last Vitals:  Vitals Value Taken Time  BP 131/57 03/29/19 1220  Temp 36.1 C 03/29/19 1216  Pulse 64 03/29/19 1220  Resp 17 03/29/19 1220  SpO2 99 % 03/29/19 1220  Vitals shown include unvalidated device data.  Last Pain:  Vitals:   03/29/19 1216  TempSrc: Temporal  PainSc:          Complications: No apparent anesthesia complications

## 2019-03-30 ENCOUNTER — Encounter (HOSPITAL_COMMUNITY): Payer: Self-pay | Admitting: Gastroenterology

## 2019-03-31 ENCOUNTER — Encounter: Payer: Self-pay | Admitting: Gastroenterology

## 2019-04-18 ENCOUNTER — Encounter: Payer: Self-pay | Admitting: Internal Medicine

## 2019-04-18 ENCOUNTER — Other Ambulatory Visit: Payer: Self-pay

## 2019-04-18 ENCOUNTER — Ambulatory Visit (INDEPENDENT_AMBULATORY_CARE_PROVIDER_SITE_OTHER): Payer: Medicare Other | Admitting: Internal Medicine

## 2019-04-18 VITALS — BP 126/84 | HR 81 | Temp 97.6°F | Ht 65.0 in | Wt 174.0 lb

## 2019-04-18 DIAGNOSIS — R7302 Impaired glucose tolerance (oral): Secondary | ICD-10-CM

## 2019-04-18 DIAGNOSIS — E785 Hyperlipidemia, unspecified: Secondary | ICD-10-CM | POA: Diagnosis not present

## 2019-04-18 DIAGNOSIS — F329 Major depressive disorder, single episode, unspecified: Secondary | ICD-10-CM | POA: Diagnosis not present

## 2019-04-18 DIAGNOSIS — F32A Depression, unspecified: Secondary | ICD-10-CM

## 2019-04-18 LAB — POCT GLYCOSYLATED HEMOGLOBIN (HGB A1C): Hemoglobin A1C: 5.6 % (ref 4.0–5.6)

## 2019-04-18 NOTE — Progress Notes (Signed)
Subjective:    Patient ID: Molly Lewis, female    DOB: Oct 13, 1950, 68 y.o.   MRN: [id number]  HPI  Here to f/u; overall doing ok,  Pt denies chest pain, increasing sob or doe, wheezing, orthopnea, PND, increased LE swelling, palpitations, dizziness or syncope.  Pt denies new neurological symptoms such as new headache, or facial or extremity weakness or numbness.  Pt denies polydipsia, polyuria, or low sugar episode.  Pt states overall good compliance with meds, mostly trying to follow appropriate diet, with wt overall stable,  but little exercise however.  Work part time in a [employer].  No new complaints  Denies worsening depressive symptoms, suicidal ideation, or panic Past Medical History:  Diagnosis Date  . Allergic rhinitis, cause unspecified 07/18/2011  . Allergy   . ANXIETY 06/23/2007  . BREAST BIOPSY, HX OF 06/23/2007   benign  . Bursitis   . Cataract   . Chronic back pain   . Chronic constipation    "i have had it all my life and the last time i had a colonoscopy they tol me i have a distened colon because of it"   . COLONIC POLYPS, HX OF 06/27/2008  . COMMON MIGRAINE 06/23/2007  . DEPRESSION 06/23/2007  . GERD 06/23/2007  . HYPERLIPIDEMIA 06/23/2007  . Impaired glucose tolerance 02/16/2011  . INSOMNIA-SLEEP DISORDER-UNSPEC 06/26/2009  . Irritable bowel syndrome 06/23/2007  . Microhematuria 07/31/2015  . OSTEOARTHRITIS, LUMBAR SPINE 06/23/2007  . OSTEOPENIA 07/11/2008   Past Surgical History:  Procedure Laterality Date  . APPENDECTOMY    . CATARACT EXTRACTION     BIL  . colonoscopy  02/2019  . COLONOSCOPY WITH PROPOFOL N/A 03/29/2019   Procedure: COLONOSCOPY WITH PROPOFOL;  Surgeon: Rush Landmark Telford Nab., MD;  Location: [facility];  Service: Gastroenterology;  Laterality: N/A;  . ENDOSCOPIC MUCOSAL RESECTION N/A 03/29/2019   Procedure: ENDOSCOPIC MUCOSAL RESECTION;  Surgeon: Rush Landmark Telford Nab., MD;  Location: [facility];  Service: Gastroenterology;   Laterality: N/A;  . HEMOSTASIS CLIP PLACEMENT  03/29/2019   Procedure: HEMOSTASIS CLIP PLACEMENT;  Surgeon: Irving Copas., MD;  Location: [facility];  Service: Gastroenterology;;  . RETINAL DETACHMENT SURGERY Right 01/2019  . RETINAL TEAR REPAIR CRYOTHERAPY Right   . SUBMUCOSAL LIFTING INJECTION  03/29/2019   Procedure: SUBMUCOSAL LIFTING INJECTION;  Surgeon: Rush Landmark Telford Nab., MD;  Location: [facility];  Service: Gastroenterology;;  . TOTAL ABDOMINAL HYSTERECTOMY      reports that she quit smoking about 10 years ago. Her smoking use included cigarettes. She has never used smokeless tobacco. She reports that she does not drink alcohol or use drugs. family history includes Brain cancer in her maternal grandmother and mother; Heart disease in her father; Hypertension in her father. Allergies  Allergen Reactions  . Codeine Nausea And Vomiting    Violent vomiting   Current Outpatient Medications on File Prior to Visit  Medication Sig Dispense Refill  . ALPRAZolam (XANAX) 1 MG tablet Take 1 tablet (1 mg total) by mouth 2 (two) times daily as needed. (Patient taking differently: Take 1 mg by mouth 2 (two) times daily as needed for anxiety. ) 60 tablet 5  . aspirin EC 81 MG tablet Take 81 mg by mouth daily.    Marland Kitchen atorvastatin (LIPITOR) 80 MG tablet Take 1 tablet (80 mg total) by mouth daily. 90 tablet 3  . Calcium Carbonate-Vitamin D (CALCIUM-D PO) Take 1,200 mg by mouth daily.    . celecoxib (CELEBREX) 200 MG capsule Take 1 capsule (200  mg total) by mouth 2 (two) times daily as needed for mild pain or moderate pain. 180 capsule 1  . gabapentin (NEURONTIN) 300 MG capsule Take 1 capsule (300 mg total) by mouth 3 (three) times daily. - to start after the 100 mg treatment (Patient taking differently: Take 300 mg by mouth at bedtime as needed (pain). - to start after the 100 mg treatment) 90 capsule 5  . metroNIDAZOLE (METROGEL) 0.75 % gel Apply 1 application topically 2 (two) times  daily.   2  . Multiple Vitamin (MULTIVITAMIN WITH MINERALS) TABS tablet Take 1 tablet by mouth daily.    Marland Kitchen omeprazole (PRILOSEC) 20 MG capsule Take 1 capsule by mouth two times daily (Patient taking differently: Take 20 mg by mouth 2 (two) times daily before a meal. ) 180 capsule 3  . Probiotic Product (PROBIOTIC DAILY PO) Take 1 capsule by mouth daily.    . traMADol (ULTRAM) 50 MG tablet Take 1 tablet (50 mg total) by mouth 4 (four) times daily as needed. (Patient taking differently: Take 50 mg by mouth 4 (four) times daily as needed (pain). ) 120 tablet 0   No current facility-administered medications on file prior to visit.    Review of Systems  Constitutional: Negative for other unusual diaphoresis or sweats HENT: Negative for ear discharge or swelling Eyes: Negative for other worsening visual disturbances Respiratory: Negative for stridor or other swelling  Gastrointestinal: Negative for worsening distension or other blood Genitourinary: Negative for retention or other urinary change Musculoskeletal: Negative for other MSK pain or swelling Skin: Negative for color change or other new lesions Neurological: Negative for worsening tremors and other numbness  Psychiatric/Behavioral: Negative for worsening agitation or other fatigue All other system neg per pt    Objective:   Physical Exam BP 126/84   Pulse 81   Temp 97.6 F (36.4 C) (Oral)   Ht 5\' 5"  (1.651 m)   Wt 174 lb (78.9 kg)   SpO2 93%   BMI 28.96 kg/m  VS noted,  Constitutional: Pt appears in NAD HENT: Head: NCAT.  Right Ear: External ear normal.  Left Ear: External ear normal.  Eyes: . Pupils are equal, round, and reactive to light. Conjunctivae and EOM are normal Nose: without d/c or deformity Neck: Neck supple. Gross normal ROM Cardiovascular: Normal rate and regular rhythm.   Pulmonary/Chest: Effort normal and breath sounds without rales or wheezing.  Abd:  Soft, NT, ND, + BS, no organomegaly Neurological: Pt  is alert. At baseline orientation, motor grossly intact Skin: Skin is warm. No rashes, other new lesions, no LE edema Psychiatric: Pt behavior is normal without agitation , not depressed affect No other exam findings  POCT glycosylated hemoglobin (Hb A1C) Order: [id number] Status:  Final result Visible to patient:  No (not released) Dx:  Impaired glucose tolerance  Ref Range & Units 10:18 28mo ago 63yr ago 71yr ago 11yr ago 41yr ago 45yr ago  Hemoglobin A1C 4.0 - 5.6 % 5.6  5.9 R, CM  5.8 R, CM  5.7 R, CM  5.5 R, CM  5.7 R, CM  5.8 R,            Assessment & Plan:

## 2019-04-18 NOTE — Patient Instructions (Signed)
Please continue all other medications as before, and refills have been done if requested.  Please have the pharmacy call with any other refills you may need.  Please continue your efforts at being more active, low cholesterol diet, and weight control.  You are otherwise up to date with prevention measures today.  Please keep your appointments with your specialists as you may have planned  Please return in 6 months, or sooner if needed,

## 2019-04-20 ENCOUNTER — Encounter: Payer: Self-pay | Admitting: Internal Medicine

## 2019-04-20 NOTE — Assessment & Plan Note (Signed)
stable overall by history and exam, recent data reviewed with pt, and pt to continue medical treatment as before,  to f/u any worsening symptoms or concerns  

## 2019-04-25 ENCOUNTER — Telehealth: Payer: Self-pay | Admitting: Internal Medicine

## 2019-04-25 MED ORDER — ALPRAZOLAM 1 MG PO TABS: 1.0000 mg | ORAL_TABLET | Freq: Two times a day (BID) | ORAL | 5 refills | Status: DC | PRN

## 2019-04-25 NOTE — Telephone Encounter (Signed)
Done erx 

## 2019-05-31 ENCOUNTER — Other Ambulatory Visit: Payer: Self-pay

## 2019-05-31 DIAGNOSIS — R6889 Other general symptoms and signs: Secondary | ICD-10-CM | POA: Diagnosis not present

## 2019-05-31 DIAGNOSIS — Z20822 Contact with and (suspected) exposure to covid-19: Secondary | ICD-10-CM

## 2019-06-01 LAB — NOVEL CORONAVIRUS, NAA: SARS-CoV-2, NAA: DETECTED — AB

## 2019-06-03 ENCOUNTER — Emergency Department (HOSPITAL_COMMUNITY)
Admission: EM | Admit: 2019-06-03 | Discharge: 2019-06-03 | Disposition: A | Discharge status: Home / Self Care | Payer: Medicare Other | Attending: Emergency Medicine | Admitting: Emergency Medicine

## 2019-06-03 ENCOUNTER — Other Ambulatory Visit: Payer: Self-pay

## 2019-06-03 ENCOUNTER — Encounter (HOSPITAL_COMMUNITY): Payer: Self-pay | Admitting: Emergency Medicine

## 2019-06-03 DIAGNOSIS — U071 COVID-19: Secondary | ICD-10-CM | POA: Diagnosis not present

## 2019-06-03 DIAGNOSIS — R197 Diarrhea, unspecified: Secondary | ICD-10-CM | POA: Diagnosis not present

## 2019-06-03 DIAGNOSIS — I10 Essential (primary) hypertension: Secondary | ICD-10-CM | POA: Diagnosis not present

## 2019-06-03 DIAGNOSIS — R5381 Other malaise: Secondary | ICD-10-CM | POA: Diagnosis not present

## 2019-06-03 DIAGNOSIS — R1111 Vomiting without nausea: Secondary | ICD-10-CM | POA: Diagnosis not present

## 2019-06-03 DIAGNOSIS — R112 Nausea with vomiting, unspecified: Secondary | ICD-10-CM | POA: Insufficient documentation

## 2019-06-03 LAB — COMPREHENSIVE METABOLIC PANEL
ALT: 42 U/L (ref 0–44)
AST: 41 U/L (ref 15–41)
Albumin: 3.9 g/dL (ref 3.5–5.0)
Alkaline Phosphatase: 122 U/L (ref 38–126)
Anion gap: 15 (ref 5–15)
BUN: 13 mg/dL (ref 8–23)
CO2: 23 mmol/L (ref 22–32)
Calcium: 9.6 mg/dL (ref 8.9–10.3)
Chloride: 101 mmol/L (ref 98–111)
Creatinine, Ser: 0.68 mg/dL (ref 0.44–1.00)
GFR calc Af Amer: >60 mL/min (ref >60)
GFR calc non Af Amer: >60 mL/min (ref >60)
Glucose, Bld: 148 mg/dL — ABNORMAL HIGH (ref 70–99)
Potassium: 3.2 mmol/L — ABNORMAL LOW (ref 3.5–5.1)
Sodium: 139 mmol/L (ref 135–145)
Total Bilirubin: 0.6 mg/dL (ref 0.3–1.2)
Total Protein: 7.3 g/dL (ref 6.5–8.1)

## 2019-06-03 LAB — CBC WITH DIFFERENTIAL/PLATELET
Abs Immature Granulocytes: 0.04 10*3/uL (ref 0.00–0.07)
Basophils Absolute: 0 10*3/uL (ref 0.0–0.1)
Basophils Relative: 0 %
Eosinophils Absolute: 0 10*3/uL (ref 0.0–0.5)
Eosinophils Relative: 0 %
HCT: 41.6 % (ref 36.0–46.0)
Hemoglobin: 14.3 g/dL (ref 12.0–15.0)
Immature Granulocytes: 1 %
Lymphocytes Relative: 23 %
Lymphs Abs: 1.4 10*3/uL (ref 0.7–4.0)
MCH: 29.5 pg (ref 26.0–34.0)
MCHC: 34.4 g/dL (ref 30.0–36.0)
MCV: 85.8 fL (ref 80.0–100.0)
Monocytes Absolute: 0.4 10*3/uL (ref 0.1–1.0)
Monocytes Relative: 7 %
Neutro Abs: 4.1 10*3/uL (ref 1.7–7.7)
Neutrophils Relative %: 69 %
Platelets: 263 10*3/uL (ref 150–400)
RBC: 4.85 MIL/uL (ref 3.87–5.11)
RDW: 12.6 % (ref 11.5–15.5)
WBC: 5.9 10*3/uL (ref 4.0–10.5)
nRBC: 0 % (ref 0.0–0.2)

## 2019-06-03 MED ORDER — SODIUM CHLORIDE 0.9 % IV BOLUS
1000.0000 mL | Freq: Once | INTRAVENOUS | Status: AC
  Administered 2019-06-03: 1000 mL via INTRAVENOUS

## 2019-06-03 MED ORDER — POTASSIUM CHLORIDE 10 MEQ/100ML IV SOLN
10.0000 meq | Freq: Once | INTRAVENOUS | Status: AC
  Administered 2019-06-03: 19:00:00 10 meq via INTRAVENOUS
  Filled 2019-06-03: qty 100

## 2019-06-03 MED ORDER — LORAZEPAM 2 MG/ML IJ SOLN
1.0000 mg | Freq: Once | INTRAMUSCULAR | Status: AC
  Administered 2019-06-03: 20:00:00 1 mg via INTRAVENOUS
  Filled 2019-06-03: qty 1

## 2019-06-03 MED ORDER — ONDANSETRON HCL 4 MG PO TABS: 4.0000 mg | ORAL_TABLET | Freq: Four times a day (QID) | ORAL | 0 refills | Status: DC | PRN

## 2019-06-03 MED ORDER — ONDANSETRON 4 MG PO TBDP
4.0000 mg | ORAL_TABLET | Freq: Once | ORAL | Status: AC
  Administered 2019-06-03: 17:00:00 4 mg via ORAL
  Filled 2019-06-03: qty 1

## 2019-06-03 MED ORDER — METOCLOPRAMIDE HCL 5 MG/ML IJ SOLN
10.0000 mg | Freq: Once | INTRAMUSCULAR | Status: AC
  Administered 2019-06-03: 10 mg via INTRAVENOUS
  Filled 2019-06-03: qty 2

## 2019-06-03 NOTE — ED Provider Notes (Signed)
Medical screening examination/treatment/procedure(s) were conducted as a shared visit with non-physician practitioner(s) and myself.  I personally evaluated the patient during the encounter.  Clinical Impression:   Final diagnoses:  None    Started with n/v/d yetserday after Covid Dx 3 days ago after having fatigue and loss of smell, no cough / sob of concern - no focal ttp, has some dehydration - nausea to be controlled with meds / fluids.  Mild HypoK. Zofran Potassium IV PO challenge Maintain airborne precautions No resp c/o that would require admission.   Noemi Chapel, MD 06/04/19 [phone]

## 2019-06-03 NOTE — ED Provider Notes (Signed)
[facility] EMERGENCY DEPARTMENT Provider Note   CSN: [id number] Arrival date & time: 06/03/19  1617     History   Chief Complaint Chief Complaint  Patient presents with  . Emesis  . COVID +    HPI Molly Lewis is a 68 y.o. female history of HLD, depression, anxiety, allergic rhinitis presents with NVD that began yesterday and worsened today. Patient has felt weak and tired since Friday and was swabbed Monday for COVID which resulted positive.   Patient states emesis is NBNB and that she is unable to keep any fluids or food down since yesterday. Patient states she is mostly dry-heaving now. Feels that she can go home if she can get her nausea under control. States she has developed a headache and some abdominal pain today that she feels is due to her forceful vomiting and dehydration.       HPI  Past Medical History:  Diagnosis Date  . Allergic rhinitis, cause unspecified 07/18/2011  . Allergy   . ANXIETY 06/23/2007  . BREAST BIOPSY, HX OF 06/23/2007   benign  . Bursitis   . Cataract   . Chronic back pain   . Chronic constipation    "i have had it all my life and the last time i had a colonoscopy they tol me i have a distened colon because of it"   . COLONIC POLYPS, HX OF 06/27/2008  . COMMON MIGRAINE 06/23/2007  . DEPRESSION 06/23/2007  . GERD 06/23/2007  . HYPERLIPIDEMIA 06/23/2007  . Impaired glucose tolerance 02/16/2011  . INSOMNIA-SLEEP DISORDER-UNSPEC 06/26/2009  . Irritable bowel syndrome 06/23/2007  . Microhematuria 07/31/2015  . OSTEOARTHRITIS, LUMBAR SPINE 06/23/2007  . OSTEOPENIA 07/11/2008    Patient Active Problem List   Diagnosis Date Noted  . Polyarthralgia 10/14/2018  . Bilateral knee pain 10/14/2018  . History of colon polyps 10/14/2018  . Estrogen deficiency 10/14/2018  . Paresthesia 10/14/2018  . Chest pain 07/22/2018  . Dysphagia 07/22/2018  . Angioedema 07/22/2018  . Low back pain 06/17/2017  . Abdominal pain  03/24/2017  . Microhematuria 07/31/2015  . Right knee pain 02/06/2015  . Left knee pain 02/06/2015  . Bilateral plantar fasciitis 07/23/2013  . Right lateral epicondylitis 07/23/2013  . Allergic rhinitis 07/18/2011  . Encounter for long-term (current) use of high-risk medication 07/17/2011  . Preventative health care 02/16/2011  . Impaired glucose tolerance 02/16/2011  . INSOMNIA-SLEEP DISORDER-UNSPEC 06/26/2009  . INTERMITTENT VERTIGO 05/20/2009  . OSTEOPENIA 07/11/2008  . Obesity, unspecified 06/27/2008  . MENOPAUSAL DISORDER 06/27/2008  . COLONIC POLYPS, HX OF 06/27/2008  . LIVER FUNCTION TESTS, ABNORMAL 08/30/2007  . Hyperlipidemia 06/23/2007  . Anxiety state 06/23/2007  . Depression 06/23/2007  . COMMON MIGRAINE 06/23/2007  . GERD 06/23/2007  . Irritable bowel syndrome 06/23/2007  . OSTEOARTHRITIS, LUMBAR SPINE 06/23/2007  . LOW BACK PAIN 06/23/2007  . BREAST BIOPSY, HX OF 06/23/2007    Past Surgical History:  Procedure Laterality Date  . APPENDECTOMY    . CATARACT EXTRACTION     BIL  . colonoscopy  02/2019  . COLONOSCOPY WITH PROPOFOL N/A 03/29/2019   Procedure: COLONOSCOPY WITH PROPOFOL;  Surgeon: Rush Landmark Telford Nab., MD;  Location: [facility];  Service: Gastroenterology;  Laterality: N/A;  . ENDOSCOPIC MUCOSAL RESECTION N/A 03/29/2019   Procedure: ENDOSCOPIC MUCOSAL RESECTION;  Surgeon: Rush Landmark Telford Nab., MD;  Location: [facility];  Service: Gastroenterology;  Laterality: N/A;  . HEMOSTASIS CLIP PLACEMENT  03/29/2019   Procedure: HEMOSTASIS CLIP PLACEMENT;  Surgeon: Justice Britain  Brooke Bonito., MD;  Location: [facility];  Service: Gastroenterology;;  . RETINAL DETACHMENT SURGERY Right 01/2019  . RETINAL TEAR REPAIR CRYOTHERAPY Right   . SUBMUCOSAL LIFTING INJECTION  03/29/2019   Procedure: SUBMUCOSAL LIFTING INJECTION;  Surgeon: Rush Landmark Telford Nab., MD;  Location: [facility];  Service: Gastroenterology;;  . TOTAL ABDOMINAL HYSTERECTOMY       OB  History   No obstetric history on file.      Home Medications    Prior to Admission medications   Medication Sig Start Date End Date Taking? Authorizing Provider  ALPRAZolam Duanne Moron) 1 MG tablet Take 1 tablet (1 mg total) by mouth 2 (two) times daily as needed. 04/25/19   Biagio Borg, MD  aspirin EC 81 MG tablet Take 81 mg by mouth daily.    [provider]  atorvastatin (LIPITOR) 80 MG tablet Take 1 tablet (80 mg total) by mouth daily. 10/14/18   Biagio Borg, MD  Calcium Carbonate-Vitamin D (CALCIUM-D PO) Take 1,200 mg by mouth daily.    [provider]  celecoxib (CELEBREX) 200 MG capsule Take 1 capsule (200 mg total) by mouth 2 (two) times daily as needed for mild pain or moderate pain. 10/14/18   Biagio Borg, MD  gabapentin (NEURONTIN) 300 MG capsule Take 1 capsule (300 mg total) by mouth 3 (three) times daily. - to start after the 100 mg treatment Patient taking differently: Take 300 mg by mouth at bedtime as needed (pain). - to start after the 100 mg treatment 06/17/17   Biagio Borg, MD  metroNIDAZOLE (METROGEL) 0.75 % gel Apply 1 application topically 2 (two) times daily.  06/13/18   [provider]  Multiple Vitamin (MULTIVITAMIN WITH MINERALS) TABS tablet Take 1 tablet by mouth daily.    [provider]  omeprazole (PRILOSEC) 20 MG capsule Take 1 capsule by mouth two times daily Patient taking differently: Take 20 mg by mouth 2 (two) times daily before a meal.  10/14/18   Biagio Borg, MD  Probiotic Product (PROBIOTIC DAILY PO) Take 1 capsule by mouth daily.    [provider]  traMADol (ULTRAM) 50 MG tablet Take 1 tablet (50 mg total) by mouth 4 (four) times daily as needed. Patient taking differently: Take 50 mg by mouth 4 (four) times daily as needed (pain).  05/11/18   Biagio Borg, MD    Family History Family History  Problem Relation Age of Onset  . Heart disease Father        MI in early 2's  . Hypertension Father   . Brain  cancer Mother   . Brain cancer Maternal Grandmother   . Colon cancer Neg Hx   . Esophageal cancer Neg Hx   . Rectal cancer Neg Hx   . Stomach cancer Neg Hx     Social History Social History   Tobacco Use  . Smoking status: Former Smoker    Types: Cigarettes    Quit date: 04/16/2009    Years since quitting: 10.1  . Smokeless tobacco: Never Used  . Tobacco comment: Quit smoking 04/15/09  Substance Use Topics  . Alcohol use: No    Comment: rare,occasional  . Drug use: No     Allergies   Codeine   Review of Systems Review of Systems  Constitutional: Negative for chills and fever.  HENT: Negative for congestion and ear pain.   Eyes: Negative for pain.  Respiratory: Negative for cough and shortness of breath.   Cardiovascular: Negative  for chest pain.  Gastrointestinal: Positive for abdominal pain, diarrhea, nausea and vomiting. Negative for blood in stool and constipation.  Endocrine: Negative for polydipsia and polyuria.  Genitourinary: Negative for difficulty urinating, dysuria and hematuria.  Musculoskeletal: Negative for myalgias.  Neurological: Positive for headaches. Negative for dizziness.     Physical Exam Updated Vital Signs BP (!) 150/89   Pulse 85   Temp 98.5 F (36.9 C) (Oral)   Resp 14   Ht 5\' 5"  (1.651 m)   Wt 77.1 kg   SpO2 99%   BMI 28.29 kg/m   Physical Exam Vitals signs and nursing note reviewed.  Constitutional:      Appearance: She is not ill-appearing.  HENT:     Head: Normocephalic and atraumatic.     Mouth/Throat:     Mouth: Mucous membranes are dry.  Eyes:     General: No scleral icterus. Neck:     Musculoskeletal: No neck rigidity.  Cardiovascular:     Rate and Rhythm: Normal rate and regular rhythm.     Pulses: Normal pulses.     Heart sounds: Normal heart sounds.  Pulmonary:     Effort: Pulmonary effort is normal.     Breath sounds: Normal breath sounds.  Abdominal:     General: Abdomen is flat. Bowel sounds are normal.  There is no distension.     Palpations: Abdomen is soft.     Tenderness: There is no abdominal tenderness. There is no guarding or rebound.  Musculoskeletal:     Right lower leg: No edema.     Left lower leg: No edema.  Skin:    General: Skin is warm and dry.     Capillary Refill: Capillary refill takes less than 2 seconds.  Neurological:     Mental Status: She is alert. Mental status is at baseline.  Psychiatric:        Behavior: Behavior normal.      ED Treatments / Results  Labs (all labs ordered are listed, but only abnormal results are displayed) Labs Reviewed  COMPREHENSIVE METABOLIC PANEL - Abnormal; Notable for the following components:      Result Value   Potassium 3.2 (*)    Glucose, Bld 148 (*)    All other components within normal limits  CBC WITH DIFFERENTIAL/PLATELET    EKG None  Radiology No results found.  Procedures Procedures (including critical care time)  Medications Ordered in ED Medications  ondansetron (ZOFRAN-ODT) disintegrating tablet 4 mg (4 mg Oral Given 06/03/19 1646)  sodium chloride 0.9 % bolus 1,000 mL (0 mLs Intravenous Stopped 06/03/19 2130)  metoCLOPramide (REGLAN) injection 10 mg (10 mg Intravenous Given 06/03/19 1911)  potassium chloride 10 mEq in 100 mL IVPB (0 mEq Intravenous Stopped 06/03/19 2130)  sodium chloride 0.9 % bolus 1,000 mL (0 mLs Intravenous Stopped 06/03/19 2130)  LORazepam (ATIVAN) injection 1 mg (1 mg Intravenous Given 06/03/19 1955)     Initial Impression / Assessment and Plan / ED Course  I have reviewed the triage vital signs and the nursing notes.  Pertinent labs & imaging results that were available during my care of the patient were reviewed by me and considered in my medical decision making (see chart for details).        Patient is generally healthy 68 year old female with recent positive COVID test who presents for inability to tolerate PO for the past 2 days with NV and diarrhea.   Patient has benign  physical exam with no abdominal  tenderness or rigidity. WBC is WNL, vitals are WNL and patient is afebrile. Patient appears in no acute distress but has dry oral mucus membranes. Will give IVF, metoclopromide for nausea and headache and PO challenge after. Already given zofran by nursing staff.  Patient given potassium via IV for mild hypokalemia.  11:17 PM reassessed patient. Not nauseated at this time and feels well. Was anxious earlier but given ativan by nursing staff. Patient states she was anxious but is better now. Patient denies abdominal pain at this time. Repeat abd exam is without tenderness.   11:17 PM PO challenge successful without any nausea. Patient states she feels better and is ready to go home. No TTP on abd exam.   Discussed return precautions with patient.  Follow-up with primary care.        The patient appears reasonably screened and/or stabilized for discharge and I doubt any other medical condition or other [facility] requiring further screening, evaluation, or treatment in the ED at this time prior to discharge.  Patient is hemodynamically stable, in NAD, and able to ambulate in the ED. Pain has been managed or a plan has been made for home management and has no complaints prior to discharge. Patient is comfortable with above plan and is stable for discharge at this time. All questions were answered prior to disposition. Results from the ER workup discussed with the patient face to face and all questions answered to the best of my ability. The patient is safe for discharge with strict return precautions. Patient appears safe for discharge with appropriate follow-up.  Conveyed my impression with the patient and he voiced understanding and is agreeable to plan.   An After Visit Summary was printed and given to the patient.  Portions of this note were generated with L[employer]. Dictation errors may occur despite best attempts at proofreading.     Final Clinical  Impressions(s) / ED Diagnoses   Final diagnoses:  None    ED Discharge Orders    None       Tedd Sias, [place] 06/03/19 2317    Noemi Chapel, MD 06/04/19 1547

## 2019-06-03 NOTE — ED Triage Notes (Signed)
Pt to ED via [facility] with c/o nausea, vomiting and diarrhea.  Pt was tested + for COVID on Tues and started with nausea and vomiting on Wed.    Diarrhea started yesterday.  Pt st's unable to keep anything down.  Has not taken anything for symptoms

## 2019-06-03 NOTE — Discharge Instructions (Addendum)
Please follow-up with your primary care physician in the next 2-3 days.  Please maintain adequate hydration by drinking plenty of water supplement as needed with Gatorade or Pedialyte.  Please use Zofran for nausea as needed as prescribed. Consider adhering to a bland food diet while nausea lasts.

## 2019-06-23 DIAGNOSIS — H6121 Impacted cerumen, right ear: Secondary | ICD-10-CM | POA: Diagnosis not present

## 2019-06-23 DIAGNOSIS — H60311 Diffuse otitis externa, right ear: Secondary | ICD-10-CM | POA: Diagnosis not present

## 2019-06-23 DIAGNOSIS — Z23 Encounter for immunization: Secondary | ICD-10-CM | POA: Diagnosis not present

## 2019-06-23 DIAGNOSIS — H6982 Other specified disorders of Eustachian tube, left ear: Secondary | ICD-10-CM | POA: Diagnosis not present

## 2019-08-10 DIAGNOSIS — M25561 Pain in right knee: Secondary | ICD-10-CM | POA: Diagnosis not present

## 2019-08-14 DIAGNOSIS — H33021 Retinal detachment with multiple breaks, right eye: Secondary | ICD-10-CM | POA: Diagnosis not present

## 2019-08-14 DIAGNOSIS — Z961 Presence of intraocular lens: Secondary | ICD-10-CM | POA: Diagnosis not present

## 2019-08-14 DIAGNOSIS — H43811 Vitreous degeneration, right eye: Secondary | ICD-10-CM | POA: Diagnosis not present

## 2019-08-14 DIAGNOSIS — H33301 Unspecified retinal break, right eye: Secondary | ICD-10-CM | POA: Diagnosis not present

## 2019-08-17 DIAGNOSIS — H33021 Retinal detachment with multiple breaks, right eye: Secondary | ICD-10-CM | POA: Diagnosis not present

## 2019-08-17 DIAGNOSIS — H3521 Other non-diabetic proliferative retinopathy, right eye: Secondary | ICD-10-CM | POA: Diagnosis not present

## 2019-10-19 ENCOUNTER — Encounter: Payer: Self-pay | Admitting: Internal Medicine

## 2019-10-19 ENCOUNTER — Ambulatory Visit (INDEPENDENT_AMBULATORY_CARE_PROVIDER_SITE_OTHER): Payer: Medicare Other | Admitting: Internal Medicine

## 2019-10-19 ENCOUNTER — Other Ambulatory Visit: Payer: Self-pay

## 2019-10-19 VITALS — BP 142/80 | HR 80 | Temp 99.0°F | Ht 65.0 in | Wt 175.6 lb

## 2019-10-19 DIAGNOSIS — E669 Obesity, unspecified: Secondary | ICD-10-CM | POA: Diagnosis not present

## 2019-10-19 DIAGNOSIS — E559 Vitamin D deficiency, unspecified: Secondary | ICD-10-CM | POA: Diagnosis not present

## 2019-10-19 DIAGNOSIS — E785 Hyperlipidemia, unspecified: Secondary | ICD-10-CM | POA: Diagnosis not present

## 2019-10-19 DIAGNOSIS — E538 Deficiency of other specified B group vitamins: Secondary | ICD-10-CM

## 2019-10-19 DIAGNOSIS — E611 Iron deficiency: Secondary | ICD-10-CM | POA: Diagnosis not present

## 2019-10-19 DIAGNOSIS — Z23 Encounter for immunization: Secondary | ICD-10-CM

## 2019-10-19 DIAGNOSIS — R7302 Impaired glucose tolerance (oral): Secondary | ICD-10-CM

## 2019-10-19 DIAGNOSIS — F329 Major depressive disorder, single episode, unspecified: Secondary | ICD-10-CM

## 2019-10-19 DIAGNOSIS — F32A Depression, unspecified: Secondary | ICD-10-CM

## 2019-10-19 LAB — CBC WITH DIFFERENTIAL/PLATELET
Basophils Absolute: 0 10*3/uL (ref 0.0–0.1)
Basophils Relative: 0.8 % (ref 0.0–3.0)
Eosinophils Absolute: 0.1 10*3/uL (ref 0.0–0.7)
Eosinophils Relative: 1.8 % (ref 0.0–5.0)
HCT: 40.9 % (ref 36.0–46.0)
Hemoglobin: 13.8 g/dL (ref 12.0–15.0)
Lymphocytes Relative: 48.8 % — ABNORMAL HIGH (ref 12.0–46.0)
Lymphs Abs: 2.2 10*3/uL (ref 0.7–4.0)
MCHC: 33.6 g/dL (ref 30.0–36.0)
MCV: 87.9 fl (ref 78.0–100.0)
Monocytes Absolute: 0.4 10*3/uL (ref 0.1–1.0)
Monocytes Relative: 9.1 % (ref 3.0–12.0)
Neutro Abs: 1.8 10*3/uL (ref 1.4–7.7)
Neutrophils Relative %: 39.5 % — ABNORMAL LOW (ref 43.0–77.0)
Platelets: 158 10*3/uL (ref 150.0–400.0)
RBC: 4.66 Mil/uL (ref 3.87–5.11)
RDW: 13.4 % (ref 11.5–15.5)
WBC: 4.5 10*3/uL (ref 4.0–10.5)

## 2019-10-19 LAB — URINALYSIS, ROUTINE W REFLEX MICROSCOPIC
Bilirubin Urine: NEGATIVE
Hgb urine dipstick: NEGATIVE
Ketones, ur: NEGATIVE
Leukocytes,Ua: NEGATIVE
Nitrite: NEGATIVE
RBC / HPF: NONE SEEN (ref 0–?)
Specific Gravity, Urine: 1.025 (ref 1.000–1.030)
Total Protein, Urine: NEGATIVE
Urine Glucose: NEGATIVE
Urobilinogen, UA: 2 — AB (ref 0.0–1.0)
pH: 6 (ref 5.0–8.0)

## 2019-10-19 LAB — HEPATIC FUNCTION PANEL
ALT: 54 U/L — ABNORMAL HIGH (ref 0–35)
AST: 37 U/L (ref 0–37)
Albumin: 4.3 g/dL (ref 3.5–5.2)
Alkaline Phosphatase: 92 U/L (ref 39–117)
Bilirubin, Direct: 0.1 mg/dL (ref 0.0–0.3)
Total Bilirubin: 0.7 mg/dL (ref 0.2–1.2)
Total Protein: 7 g/dL (ref 6.0–8.3)

## 2019-10-19 LAB — HEMOGLOBIN A1C: Hgb A1c MFr Bld: 6.3 % (ref 4.6–6.5)

## 2019-10-19 LAB — IBC PANEL
Iron: 83 ug/dL (ref 42–145)
Saturation Ratios: 26.9 % (ref 20.0–50.0)
Transferrin: 220 mg/dL (ref 212.0–360.0)

## 2019-10-19 LAB — BASIC METABOLIC PANEL
BUN: 23 mg/dL (ref 6–23)
CO2: 30 mEq/L (ref 19–32)
Calcium: 9.5 mg/dL (ref 8.4–10.5)
Chloride: 104 mEq/L (ref 96–112)
Creatinine, Ser: 0.88 mg/dL (ref 0.40–1.20)
GFR: 64.14 mL/min (ref >60.00)
Glucose, Bld: 107 mg/dL — ABNORMAL HIGH (ref 70–99)
Potassium: 3.8 mEq/L (ref 3.5–5.1)
Sodium: 140 mEq/L (ref 135–145)

## 2019-10-19 LAB — LIPID PANEL
Cholesterol: 144 mg/dL (ref 0–200)
HDL: 43.6 mg/dL (ref >39.00)
LDL Cholesterol: 73 mg/dL (ref 0–99)
NonHDL: 100.84
Total CHOL/HDL Ratio: 3
Triglycerides: 138 mg/dL (ref 0.0–149.0)
VLDL: 27.6 mg/dL (ref 0.0–40.0)

## 2019-10-19 LAB — VITAMIN D 25 HYDROXY (VIT D DEFICIENCY, FRACTURES): VITD: 45.63 ng/mL (ref 30.00–100.00)

## 2019-10-19 LAB — TSH: TSH: 1.02 u[IU]/mL (ref 0.35–4.50)

## 2019-10-19 LAB — VITAMIN B12: Vitamin B-12: 577 pg/mL (ref 211–911)

## 2019-10-19 MED ORDER — PHENTERMINE HCL 37.5 MG PO CAPS: 37.5000 mg | ORAL_CAPSULE | ORAL | 2 refills | Status: DC

## 2019-10-19 NOTE — Assessment & Plan Note (Signed)
stable overall by history and exam, recent data reviewed with pt, and pt to continue medical treatment as before,  to f/u any worsening symptoms or concerns  

## 2019-10-19 NOTE — Assessment & Plan Note (Addendum)
stable overall by history and exam, recent data reviewed with pt, and pt to continue medical treatment as before,  to f/u any worsening symptoms or concerns  I spent 12minutes preparing to see the patient by review of recent labs, imaging and procedures, obtaining and reviewing separately obtained history, communicating with the patient and family or caregiver, ordering medications, tests or procedures, and documenting clinical information in the EHR including the differential Dx, treatment, and any further evaluation and other management of hyperglycemia, HLD, depression, obesity

## 2019-10-19 NOTE — Patient Instructions (Addendum)
Please take all new medication as prescribed - phentermine  Please continue all other medications as before, and refills have been done if requested.  Please have the pharmacy call with any other refills you may need.  Please continue your efforts at being more active, low cholesterol diet, and weight control.  You are otherwise up to date with prevention measures today.  Please keep your appointments with your specialists as you may have planned  Please go to the LAB at the blood drawing area for the tests to be done  You will be contacted by phone if any changes need to be made immediately.  Otherwise, you will receive a letter about your results with an explanation, but please check with MyChart first.  Please remember to sign up for MyChart if you have not done so, as this will be important to you in the future with finding out test results, communicating by private email, and scheduling acute appointments online when needed.  Please make an Appointment to return in 6 months, or sooner if needed

## 2019-10-19 NOTE — Assessment & Plan Note (Signed)
For phentermine asd, to f/u any worsening symptoms or concerns 

## 2019-10-19 NOTE — Progress Notes (Signed)
Subjective:    Patient ID: Molly Lewis, female    DOB: [birth year], 69 y.o.   MRN: [id number]  HPI  Here for f/u;  Overall doing ok;  Pt denies Chest pain, worsening SOB, DOE, wheezing, orthopnea, PND, worsening LE edema, palpitations, diyearly zziness or syncope.  Pt denies neurological change such as new headache, facial or extremity weakness.  Pt denies polydipsia, polyuria, or low sugar symptoms. Pt states overall good compliance with treatment and medications, good tolerability, and has been trying to follow appropriate diet.  No fever, night sweats, wt loss, loss of appetite, or other constitutional symptoms.  Pt states good ability with ADL's, has low fall risk, home safety reviewed and adequate, no other significant changes in hearing or vision, and not active with exercise due to pandemic and several health issues. Very aggrevating, with increased anxiety and depressed somewhat.  S/p COVID infection sept 2020 with persistent n/v/d with dehydration; now retired except working part time; also detached retina on right sugury x 3, colonoscopy x 2, and gained about 25 lbs due to less active and now trying to lose again. Seeing ortho for right knee locking up, not better with cortisone, to have MRI probably soon.  Also mention BP has been quite high up to 200 sometimes after the GI prep for the colonoscopies Past Medical History:  Diagnosis Date  . Allergic rhinitis, cause unspecified 07/18/2011  . Allergy   . ANXIETY 06/23/2007  . BREAST BIOPSY, HX OF 06/23/2007   benign  . Bursitis   . Cataract   . Chronic back pain   . Chronic constipation    "i have had it all my life and the last time i had a colonoscopy they tol me i have a distened colon because of it"   . COLONIC POLYPS, HX OF 06/27/2008  . COMMON MIGRAINE 06/23/2007  . DEPRESSION 06/23/2007  . GERD 06/23/2007  . HYPERLIPIDEMIA 06/23/2007  . Impaired glucose tolerance 02/16/2011  . INSOMNIA-SLEEP DISORDER-UNSPEC 06/26/2009  .  Irritable bowel syndrome 06/23/2007  . Microhematuria 07/31/2015  . OSTEOARTHRITIS, LUMBAR SPINE 06/23/2007  . OSTEOPENIA 07/11/2008   Past Surgical History:  Procedure Laterality Date  . APPENDECTOMY    . CATARACT EXTRACTION     BIL  . colonoscopy  02/2019  . COLONOSCOPY WITH PROPOFOL N/A 03/29/2019   Procedure: COLONOSCOPY WITH PROPOFOL;  Surgeon: Rush Landmark Telford Nab., MD;  Location: [facility];  Service: Gastroenterology;  Laterality: N/A;  . ENDOSCOPIC MUCOSAL RESECTION N/A 03/29/2019   Procedure: ENDOSCOPIC MUCOSAL RESECTION;  Surgeon: Rush Landmark Telford Nab., MD;  Location: [facility];  Service: Gastroenterology;  Laterality: N/A;  . HEMOSTASIS CLIP PLACEMENT  03/29/2019   Procedure: HEMOSTASIS CLIP PLACEMENT;  Surgeon: Irving Copas., MD;  Location: [facility];  Service: Gastroenterology;;  . RETINAL DETACHMENT SURGERY Right 01/2019  . RETINAL TEAR REPAIR CRYOTHERAPY Right   . SUBMUCOSAL LIFTING INJECTION  03/29/2019   Procedure: SUBMUCOSAL LIFTING INJECTION;  Surgeon: Rush Landmark Telford Nab., MD;  Location: [facility];  Service: Gastroenterology;;  . TOTAL ABDOMINAL HYSTERECTOMY      reports that she quit smoking about 10 years ago. Her smoking use included cigarettes. She has never used smokeless tobacco. She reports that she does not drink alcohol or use drugs. family history includes Brain cancer in her maternal grandmother and mother; Heart disease in her father; Hypertension in her father. Allergies  Allergen Reactions  . Codeine Nausea And Vomiting    Violent vomiting   Current Outpatient Medications on File Prior  to Visit  Medication Sig Dispense Refill  . ALPRAZolam (XANAX) 1 MG tablet Take 1 tablet (1 mg total) by mouth 2 (two) times daily as needed. 60 tablet 5  . aspirin EC 81 MG tablet Take 81 mg by mouth daily.    Marland Kitchen atorvastatin (LIPITOR) 80 MG tablet Take 1 tablet (80 mg total) by mouth daily. 90 tablet 3  . Calcium Carbonate-Vitamin D  (CALCIUM-D PO) Take 1,200 mg by mouth daily.    . celecoxib (CELEBREX) 200 MG capsule Take 1 capsule (200 mg total) by mouth 2 (two) times daily as needed for mild pain or moderate pain. 180 capsule 1  . gabapentin (NEURONTIN) 300 MG capsule Take 1 capsule (300 mg total) by mouth 3 (three) times daily. - to start after the 100 mg treatment (Patient taking differently: Take 300 mg by mouth at bedtime as needed (pain). - to start after the 100 mg treatment) 90 capsule 5  . Multiple Vitamin (MULTIVITAMIN WITH MINERALS) TABS tablet Take 1 tablet by mouth daily.    Marland Kitchen omeprazole (PRILOSEC) 20 MG capsule Take 1 capsule by mouth two times daily (Patient taking differently: Take 20 mg by mouth 2 (two) times daily before a meal. ) 180 capsule 3  . ondansetron (ZOFRAN) 4 MG tablet Take 1 tablet (4 mg total) by mouth every 6 (six) hours as needed for nausea or vomiting. 12 tablet 0  . Probiotic Product (PROBIOTIC DAILY PO) Take 1 capsule by mouth daily.    . traMADol (ULTRAM) 50 MG tablet Take 1 tablet (50 mg total) by mouth 4 (four) times daily as needed. (Patient taking differently: Take 50 mg by mouth 4 (four) times daily as needed (pain). ) 120 tablet 0  . metroNIDAZOLE (METROGEL) 0.75 % gel Apply 1 application topically 2 (two) times daily.   2   No current facility-administered medications on file prior to visit.   Review of Systems All otherwise neg per pt     Objective:   Physical Exam BP (!) 142/80   Pulse 80   Temp 99 F (37.2 C)   Ht 5\' 5"  (1.651 m)   Wt 175 lb 9.6 oz (79.7 kg)   SpO2 99%   BMI 29.22 kg/m  VS noted,  Constitutional: Pt appears in NAD HENT: Head: NCAT.  Right Ear: External ear normal.  Left Ear: External ear normal.  Eyes: . Pupils are equal, round, and reactive to light. Conjunctivae and EOM are normal Nose: without d/c or deformity Neck: Neck supple. Gross normal ROM Cardiovascular: Normal rate and regular rhythm.   Pulmonary/Chest: Effort normal and breath sounds  without rales or wheezing.  Abd:  Soft, NT, ND, + BS, no organomegaly Neurological: Pt is alert. At baseline orientation, motor grossly intact Skin: Skin is warm. No rashes, other new lesions, no LE edema Psychiatric: Pt behavior is normal without agitation  All otherwise neg per pt Lab Results  Component Value Date   WBC 4.5 10/19/2019   HGB 13.8 10/19/2019   HCT 40.9 10/19/2019   PLT 158.0 10/19/2019   GLUCOSE 107 (H) 10/19/2019   CHOL 144 10/19/2019   TRIG 138.0 10/19/2019   HDL 43.60 10/19/2019   LDLDIRECT 126.0 09/04/2016   LDLCALC 73 10/19/2019   ALT 54 (H) 10/19/2019   AST 37 10/19/2019   NA 140 10/19/2019   K 3.8 10/19/2019   CL 104 10/19/2019   CREATININE 0.88 10/19/2019   BUN 23 10/19/2019   CO2 30 10/19/2019   TSH 1.02  10/19/2019   HGBA1C 6.3 10/19/2019         Assessment & Plan:

## 2019-11-23 ENCOUNTER — Other Ambulatory Visit: Payer: Self-pay

## 2019-11-23 MED ORDER — OMEPRAZOLE 20 MG PO CPDR: DELAYED_RELEASE_CAPSULE | ORAL | 3 refills | Status: DC

## 2019-11-24 ENCOUNTER — Other Ambulatory Visit: Payer: Self-pay | Admitting: Internal Medicine

## 2019-11-24 DIAGNOSIS — Z23 Encounter for immunization: Secondary | ICD-10-CM | POA: Diagnosis not present

## 2019-11-24 NOTE — Telephone Encounter (Signed)
Done erx 

## 2019-12-14 DIAGNOSIS — M25561 Pain in right knee: Secondary | ICD-10-CM | POA: Diagnosis not present

## 2019-12-22 DIAGNOSIS — Z23 Encounter for immunization: Secondary | ICD-10-CM | POA: Diagnosis not present

## 2019-12-25 ENCOUNTER — Other Ambulatory Visit: Payer: Self-pay | Admitting: Internal Medicine

## 2019-12-26 ENCOUNTER — Telehealth (INDEPENDENT_AMBULATORY_CARE_PROVIDER_SITE_OTHER): Payer: Self-pay

## 2019-12-26 NOTE — Telephone Encounter (Signed)
I am forwarding this to Dr. Zadie Rhine as he will have to clear her from an ophthalmology stand point.

## 2019-12-26 NOTE — Telephone Encounter (Signed)
Clotilde Dieter from [facility] called regarding patient Molly Lewis.  Pt needs a MRI but is unable because pt told Clotilde Dieter she has had 3 different sx with Dr. Zadie Rhine.  She will need a clearance faxed to [phone].  Atten: MRI, Clotilde Dieter.  If you have any questions please call Clotilde Dieter at [phone].

## 2019-12-27 ENCOUNTER — Other Ambulatory Visit: Payer: Self-pay | Admitting: Internal Medicine

## 2019-12-27 ENCOUNTER — Other Ambulatory Visit: Payer: Self-pay | Admitting: Orthopedic Surgery

## 2019-12-27 DIAGNOSIS — M25561 Pain in right knee: Secondary | ICD-10-CM

## 2020-01-08 ENCOUNTER — Other Ambulatory Visit: Payer: Self-pay | Admitting: Internal Medicine

## 2020-01-08 DIAGNOSIS — H33021 Retinal detachment with multiple breaks, right eye: Secondary | ICD-10-CM | POA: Insufficient documentation

## 2020-01-08 DIAGNOSIS — H43811 Vitreous degeneration, right eye: Secondary | ICD-10-CM | POA: Insufficient documentation

## 2020-01-08 DIAGNOSIS — R0683 Snoring: Secondary | ICD-10-CM | POA: Insufficient documentation

## 2020-01-08 DIAGNOSIS — H33301 Unspecified retinal break, right eye: Secondary | ICD-10-CM | POA: Insufficient documentation

## 2020-01-08 DIAGNOSIS — Z09 Encounter for follow-up examination after completed treatment for conditions other than malignant neoplasm: Secondary | ICD-10-CM | POA: Insufficient documentation

## 2020-01-08 DIAGNOSIS — Z961 Presence of intraocular lens: Secondary | ICD-10-CM | POA: Insufficient documentation

## 2020-01-09 ENCOUNTER — Encounter (INDEPENDENT_AMBULATORY_CARE_PROVIDER_SITE_OTHER): Payer: Self-pay | Admitting: Ophthalmology

## 2020-01-09 ENCOUNTER — Ambulatory Visit (INDEPENDENT_AMBULATORY_CARE_PROVIDER_SITE_OTHER): Payer: Medicare Other | Admitting: Ophthalmology

## 2020-01-09 ENCOUNTER — Other Ambulatory Visit: Payer: Self-pay

## 2020-01-09 DIAGNOSIS — H43811 Vitreous degeneration, right eye: Secondary | ICD-10-CM | POA: Diagnosis not present

## 2020-01-09 DIAGNOSIS — H33021 Retinal detachment with multiple breaks, right eye: Secondary | ICD-10-CM

## 2020-01-09 DIAGNOSIS — Z09 Encounter for follow-up examination after completed treatment for conditions other than malignant neoplasm: Secondary | ICD-10-CM

## 2020-01-09 DIAGNOSIS — R0683 Snoring: Secondary | ICD-10-CM

## 2020-01-09 DIAGNOSIS — Z961 Presence of intraocular lens: Secondary | ICD-10-CM | POA: Diagnosis not present

## 2020-01-09 DIAGNOSIS — H33301 Unspecified retinal break, right eye: Secondary | ICD-10-CM | POA: Diagnosis not present

## 2020-01-09 NOTE — Assessment & Plan Note (Signed)
History of retinal detachment OD, repair surgically December 2020.  Reattached nicely.

## 2020-01-09 NOTE — Patient Instructions (Signed)
Vitreous Detachment  Vitreous detachment is part of the normal aging process in the eyes. Vitreous is the jelly-like substance that makes up most of the inside of the eyeballs. It helps the eyeballs keep a round shape. The vitreous is attached to the retina of the eye with a series of fibers. As you age, the vitreous gradually shrinks. Tension increases between the fibers and the retina. Eventually, the fibers can break free from the retina, causing vitreous detachment. In most cases, this does not cause problems and does not require treatment. However, it can sometimes cause the retina to separate from the eyeball (retinal detachment), which requires treatment to prevent vision loss. What are the causes? Aging is the main cause of vitreous detachment. Everyone's vitreous naturally shrinks with age. What increases the risk? You are more likely to have vitreous detachment if you:  Are at least 67 years old.  Have inflammation of the eye.  Have an eye injury.  Have had eye surgery.  Have a hemorrhage in your eye.  Are very nearsighted (myopia).  Have diabetes. What are the signs or symptoms? Most people with this condition will not notice any symptoms. If symptoms do occur, the most common are floaters. Floaters occur as the vitreous begins to shrink. They may:  Appear as tiny dots or webs in your vision.  Seem to disappear when you look at them directly.  Appear more often as your condition gets worse. Other symptoms include:  Flashes of light (photopsia) in your peripheral vision that may look like lightning streaks.  Decreased vision or a dark curtain or shadow moving across your field of vision. This is rare. How is this diagnosed? This condition may be diagnosed based on:  Your signs and symptoms.  An exam by a health care provider who specializes in conditions and diseases of the eye (ophthalmologist). The exam may include: ? Putting eye drops in your eye to make the  pupil wider (dilated). The pupil is the opening in the center of the eye. ? Checking the pupils with a magnifying glass. This exam is the best way to determine the type and extent of damage to your eye. How is this treated? For most people, a vitreous detachment is harmless, causing no symptoms or vision loss, and does not require treatment. Floaters usually become less noticeable over time. If the condition causes retinal detachment, you may need eye surgery to reattach your retina (reattachment surgery) in order to prevent vision loss or restore your vision. Follow these instructions at home:  Keep all follow-up visits as told by your health care provider. This is important. Get help right away if:  You develop signs of retinal detachment. These include: ? A sudden increase in the number of floaters you see. ? An increase in the number of flashes of light you see in your peripheral vision. ? Decreased vision. Summary  Vitreous detachment is part of the normal aging process in the eyes.  Vitreous is the jelly-like substance inside the eyeballs. As you age, the vitreous shrinks, and the fibers that attach the vitreous to the retina can break free, causing vitreous detachment.  In most cases, vitreous detachment does not cause symptoms and does not require treatment. The most common symptom that can occur is seeing floaters that appear as tiny dots or webs in your vision.  Vitreous detachment can sometimes cause the retina to separate from the eyeball (retinal detachment). This must be treated to prevent vision loss. This information is not  intended to replace advice given to you by your health care provider. Make sure you discuss any questions you have with your health care provider. Document Revised: 08/06/2017 Document Reviewed: 07/15/2017 Elsevier Patient Education  2020 [employer]  

## 2020-01-09 NOTE — Progress Notes (Signed)
01/09/2020     CHIEF COMPLAINT Patient presents for Retina Follow Up   HISTORY OF PRESENT ILLNESS: Molly Lewis is a 69 y.o. female who presents to the clinic today for:   HPI    Retina Follow Up    Patient presents with  Retinal Break/Detachment.  In right eye.  Duration of 4 months.  Since onset it is gradually worsening.          Comments    4 month follow up - FP OU Patient states that she has had blurry vision in OD since last visit. Patient also states she has occasional sharp pains in OD as well.       Last edited by Gerda Diss on 01/09/2020  8:09 AM. (History)      Referring physician: Biagio Borg, MD [place],  [place] [address]  HISTORICAL INFORMATION:   Selected notes from the MEDICAL RECORD NUMBER    Lab Results  Component Value Date   HGBA1C 6.3 10/19/2019     CURRENT MEDICATIONS: No current outpatient medications on file. (Ophthalmic Drugs)   No current facility-administered medications for this visit. (Ophthalmic Drugs)   Current Outpatient Medications (Other)  Medication Sig  . ALPRAZolam (XANAX) 1 MG tablet TAKE 1 TABLET(1 MG) BY MOUTH TWICE DAILY AS NEEDED  . aspirin EC 81 MG tablet Take 81 mg by mouth daily.  Marland Kitchen atorvastatin (LIPITOR) 80 MG tablet TAKE 1 TABLET BY MOUTH DAILY  . Calcium Carbonate-Vitamin D (CALCIUM-D PO) Take 1,200 mg by mouth daily.  . celecoxib (CELEBREX) 200 MG capsule TAKE 1 CAPSULE(200 MG) BY MOUTH TWICE DAILY AS NEEDED FOR MILD PAIN OR MODERATE PAIN  . gabapentin (NEURONTIN) 300 MG capsule Take 1 capsule (300 mg total) by mouth 3 (three) times daily. - to start after the 100 mg treatment (Patient taking differently: Take 300 mg by mouth at bedtime as needed (pain). - to start after the 100 mg treatment)  . metroNIDAZOLE (METROGEL) 0.75 % gel Apply 1 application topically 2 (two) times daily.   . Multiple Vitamin (MULTIVITAMIN WITH MINERALS) TABS tablet Take 1 tablet by mouth daily.  Marland Kitchen omeprazole  (PRILOSEC) 20 MG capsule Take 1 capsule by mouth two times daily  . ondansetron (ZOFRAN) 4 MG tablet Take 1 tablet (4 mg total) by mouth every 6 (six) hours as needed for nausea or vomiting.  . phentermine 37.5 MG capsule Take 1 capsule (37.5 mg total) by mouth every morning.  . Probiotic Product (PROBIOTIC DAILY PO) Take 1 capsule by mouth daily.  . traMADol (ULTRAM) 50 MG tablet Take 1 tablet (50 mg total) by mouth 4 (four) times daily as needed. (Patient taking differently: Take 50 mg by mouth 4 (four) times daily as needed (pain). )   No current facility-administered medications for this visit. (Other)      REVIEW OF SYSTEMS:    ALLERGIES Allergies  Allergen Reactions  . Codeine Nausea And Vomiting    Violent vomiting    PAST MEDICAL HISTORY Past Medical History:  Diagnosis Date  . Allergic rhinitis, cause unspecified 07/18/2011  . Allergy   . ANXIETY 06/23/2007  . BREAST BIOPSY, HX OF 06/23/2007   benign  . Bursitis   . Cataract   . Chronic back pain   . Chronic constipation    "i have had it all my life and the last time i had a colonoscopy they tol me i have a distened colon because of it"   .  COLONIC POLYPS, HX OF 06/27/2008  . COMMON MIGRAINE 06/23/2007  . DEPRESSION 06/23/2007  . GERD 06/23/2007  . HYPERLIPIDEMIA 06/23/2007  . Impaired glucose tolerance 02/16/2011  . INSOMNIA-SLEEP DISORDER-UNSPEC 06/26/2009  . Irritable bowel syndrome 06/23/2007  . Microhematuria 07/31/2015  . OSTEOARTHRITIS, LUMBAR SPINE 06/23/2007  . OSTEOPENIA 07/11/2008   Past Surgical History:  Procedure Laterality Date  . APPENDECTOMY    . CATARACT EXTRACTION     BIL  . colonoscopy  02/2019  . COLONOSCOPY WITH PROPOFOL N/A 03/29/2019   Procedure: COLONOSCOPY WITH PROPOFOL;  Surgeon: Rush Landmark Telford Nab., MD;  Location: [facility];  Service: Gastroenterology;  Laterality: N/A;  . ENDOSCOPIC MUCOSAL RESECTION N/A 03/29/2019   Procedure: ENDOSCOPIC MUCOSAL RESECTION;  Surgeon:  Rush Landmark Telford Nab., MD;  Location: [facility];  Service: Gastroenterology;  Laterality: N/A;  . HEMOSTASIS CLIP PLACEMENT  03/29/2019   Procedure: HEMOSTASIS CLIP PLACEMENT;  Surgeon: Irving Copas., MD;  Location: [facility];  Service: Gastroenterology;;  . RETINAL DETACHMENT SURGERY Right 01/2019  . RETINAL TEAR REPAIR CRYOTHERAPY Right   . SUBMUCOSAL LIFTING INJECTION  03/29/2019   Procedure: SUBMUCOSAL LIFTING INJECTION;  Surgeon: Rush Landmark Telford Nab., MD;  Location: [facility];  Service: Gastroenterology;;  . TOTAL ABDOMINAL HYSTERECTOMY      FAMILY HISTORY Family History  Problem Relation Age of Onset  . Heart disease Father        MI in early 20's  . Hypertension Father   . Brain cancer Mother   . Brain cancer Maternal Grandmother   . Colon cancer Neg Hx   . Esophageal cancer Neg Hx   . Rectal cancer Neg Hx   . Stomach cancer Neg Hx     SOCIAL HISTORY Social History   Tobacco Use  . Smoking status: Former Smoker    Types: Cigarettes    Quit date: 04/16/2009    Years since quitting: 10.7  . Smokeless tobacco: Never Used  . Tobacco comment: Quit smoking 04/15/09  Substance Use Topics  . Alcohol use: No    Comment: rare,occasional  . Drug use: No         OPHTHALMIC EXAM:  Base Eye Exam    Visual Acuity (Snellen - Linear)      Right Left   Dist [place] 20/30-2 20/25-1   Dist ph [place] NI        Tonometry (Tonopen, 8:14 AM)      Right Left   Pressure 16 17       Pupils      Pupils Dark Light Shape React APD   Right PERRL 4 4 Round Minimal None   Left PERRL 4 3 Round Brisk None       Visual Fields (Counting fingers)      Left Right    Full Full       Extraocular Movement      Right Left    Full Full       Neuro/Psych    Oriented x3: Yes   Mood/Affect: Normal       Dilation    Both eyes: 1.0% Mydriacyl, 2.5% Phenylephrine @ 8:14 AM        Slit Lamp and Fundus Exam    External Exam      Right Left   External Normal Normal         Slit Lamp Exam      Right Left   Lids/Lashes Normal Normal   Conjunctiva/Sclera White and quiet White and quiet   Cornea Clear Clear  Anterior Chamber Deep and quiet Deep and quiet   Iris Round and reactive Round and reactive   Lens Posterior chamber intraocular lens Posterior chamber intraocular lens   Anterior Vitreous Normal Normal       Fundus Exam      Right Left   Posterior Vitreous Clear, vitrectomized Normal   Disc Normal Normal   C/D Ratio 0.1 0.2   Macula attached, normal Normal   Vessels Normal Normal   Periphery Good peripheral laser retinopexy.,  Attached  360 Normal          IMAGING AND PROCEDURES  Imaging and Procedures for 01/09/20  Color Fundus Photography Optos - OU - Both Eyes       Right Eye Progression has improved. Macula : flat, drusen. Vessels : normal observations. Periphery : normal observations.   Left Eye Progression has no prior data. Disc findings include normal observations. Macula : drusen, flat. Vessels : normal observations. Periphery : normal observations.   Notes [id number] of retinal detachment December 2020, reattach nicely.  OD                ASSESSMENT/PLAN:  Retinal detachment of right eye with multiple breaks History of retinal detachment OD, repair surgically December 2020.  Reattached nicely.      ICD-10-CM   1. Retinal detachment of right eye with multiple breaks  H33.021 Color Fundus Photography Optos - OU - Both Eyes  2. Retinal break, right  H33.301   3. Posterior vitreous detachment of right eye  H43.811   4. Pseudophakia  Z96.1   5. Snoring  R06.83     1.  Patient will be asked to follow-up in 1 year and promptly if new difficulties or symptoms of retinal detachment were to develop.  2.  3.  Ophthalmic Meds Ordered this visit:  No orders of the defined types were placed in this encounter.      Return in about 1 year (around 01/08/2021) for DILATE OU, COLOR FP.  Patient Instructions   Vitreous Detachment  Vitreous detachment is part of the normal aging process in the eyes. Vitreous is the jelly-like substance that makes up most of the inside of the eyeballs. It helps the eyeballs keep a round shape. The vitreous is attached to the retina of the eye with a series of fibers. As you age, the vitreous gradually shrinks. Tension increases between the fibers and the retina. Eventually, the fibers can break free from the retina, causing vitreous detachment. In most cases, this does not cause problems and does not require treatment. However, it can sometimes cause the retina to separate from the eyeball (retinal detachment), which requires treatment to prevent vision loss. What are the causes? Aging is the main cause of vitreous detachment. Everyone's vitreous naturally shrinks with age. What increases the risk? You are more likely to have vitreous detachment if you:  Are at least 67 years old.  Have inflammation of the eye.  Have an eye injury.  Have had eye surgery.  Have a hemorrhage in your eye.  Are very nearsighted (myopia).  Have diabetes. What are the signs or symptoms? Most people with this condition will not notice any symptoms. If symptoms do occur, the most common are floaters. Floaters occur as the vitreous begins to shrink. They may:  Appear as tiny dots or webs in your vision.  Seem to disappear when you look at them directly.  Appear more often as your condition gets worse. Other symptoms include:  Flashes  of light (photopsia) in your peripheral vision that may look like lightning streaks.  Decreased vision or a dark curtain or shadow moving across your field of vision. This is rare. How is this diagnosed? This condition may be diagnosed based on:  Your signs and symptoms.  An exam by a health care provider who specializes in conditions and diseases of the eye (ophthalmologist). The exam may include: ? Putting eye drops in your eye to make the  pupil wider (dilated). The pupil is the opening in the center of the eye. ? Checking the pupils with a magnifying glass. This exam is the best way to determine the type and extent of damage to your eye. How is this treated? For most people, a vitreous detachment is harmless, causing no symptoms or vision loss, and does not require treatment. Floaters usually become less noticeable over time. If the condition causes retinal detachment, you may need eye surgery to reattach your retina (reattachment surgery) in order to prevent vision loss or restore your vision. Follow these instructions at home:  Keep all follow-up visits as told by your health care provider. This is important. Get help right away if:  You develop signs of retinal detachment. These include: ? A sudden increase in the number of floaters you see. ? An increase in the number of flashes of light you see in your peripheral vision. ? Decreased vision. Summary  Vitreous detachment is part of the normal aging process in the eyes.  Vitreous is the jelly-like substance inside the eyeballs. As you age, the vitreous shrinks, and the fibers that attach the vitreous to the retina can break free, causing vitreous detachment.  In most cases, vitreous detachment does not cause symptoms and does not require treatment. The most common symptom that can occur is seeing floaters that appear as tiny dots or webs in your vision.  Vitreous detachment can sometimes cause the retina to separate from the eyeball (retinal detachment). This must be treated to prevent vision loss. This information is not intended to replace advice given to you by your health care provider. Make sure you discuss any questions you have with your health care provider. Document Revised: 08/06/2017 Document Reviewed: 07/15/2017 Elsevier Patient Education  2020 [employer].     Explained the diagnoses, plan, and follow up with the patient and they expressed understanding.   Patient expressed understanding of the importance of proper follow up care.   Clent Demark Seiya Silsby M.D. Diseases & Surgery of the Retina and Vitreous [facility] [place] 01/09/20     Abbreviations: M myopia (nearsighted); A astigmatism; H hyperopia (farsighted); P presbyopia; Mrx spectacle prescription;  CTL contact lenses; OD right eye; OS left eye; OU both eyes  XT exotropia; ET esotropia; PEK punctate epithelial keratitis; PEE punctate epithelial erosions; DES dry eye syndrome; MGD meibomian gland dysfunction; ATs artificial tears; PFAT's preservative free artificial tears; [place] nuclear sclerotic cataract; PSC posterior subcapsular cataract; ERM epi-retinal membrane; PVD posterior vitreous detachment; RD retinal detachment; DM diabetes mellitus; DR diabetic retinopathy; NPDR non-proliferative diabetic retinopathy; PDR proliferative diabetic retinopathy; CSME clinically significant macular edema; DME diabetic macular edema; dbh dot blot hemorrhages; CWS cotton wool spot; POAG primary open angle glaucoma; C/D cup-to-disc ratio; HVF humphrey visual field; GVF goldmann visual field; OCT optical coherence tomography; IOP intraocular pressure; BRVO Branch retinal vein occlusion; CRVO central retinal vein occlusion; CRAO central retinal artery occlusion; BRAO branch retinal artery occlusion; RT retinal tear; SB scleral buckle; PPV pars plana vitrectomy; VH Vitreous  hemorrhage; PRP panretinal laser photocoagulation; IVK intravitreal kenalog; VMT vitreomacular traction; MH Macular hole;  NVD neovascularization of the disc; NVE neovascularization elsewhere; AREDS age related eye disease study; ARMD age related macular degeneration; POAG primary open angle glaucoma; EBMD epithelial/anterior basement membrane dystrophy; ACIOL anterior chamber intraocular lens; IOL intraocular lens; PCIOL posterior chamber intraocular lens; Phaco/IOL phacoemulsification with intraocular lens placement; [place] photorefractive  keratectomy; LASIK laser assisted in situ keratomileusis; HTN hypertension; DM diabetes mellitus; COPD chronic obstructive pulmonary disease

## 2020-01-17 ENCOUNTER — Other Ambulatory Visit: Payer: Medicare Other

## 2020-01-22 DIAGNOSIS — L821 Other seborrheic keratosis: Secondary | ICD-10-CM | POA: Diagnosis not present

## 2020-01-22 DIAGNOSIS — L812 Freckles: Secondary | ICD-10-CM | POA: Diagnosis not present

## 2020-01-22 DIAGNOSIS — D1801 Hemangioma of skin and subcutaneous tissue: Secondary | ICD-10-CM | POA: Diagnosis not present

## 2020-01-22 DIAGNOSIS — L820 Inflamed seborrheic keratosis: Secondary | ICD-10-CM | POA: Diagnosis not present

## 2020-01-23 ENCOUNTER — Ambulatory Visit
Admission: RE | Admit: 2020-01-23 | Discharge: 2020-01-23 | Disposition: A | Discharge status: Home / Self Care | Payer: Medicare Other | Source: Ambulatory Visit | Attending: Orthopedic Surgery | Admitting: Orthopedic Surgery

## 2020-01-23 ENCOUNTER — Other Ambulatory Visit: Payer: Self-pay

## 2020-01-23 DIAGNOSIS — S83241A Other tear of medial meniscus, current injury, right knee, initial encounter: Secondary | ICD-10-CM | POA: Diagnosis not present

## 2020-01-23 DIAGNOSIS — M25461 Effusion, right knee: Secondary | ICD-10-CM | POA: Diagnosis not present

## 2020-01-23 DIAGNOSIS — M25561 Pain in right knee: Secondary | ICD-10-CM

## 2020-01-25 DIAGNOSIS — M25561 Pain in right knee: Secondary | ICD-10-CM | POA: Diagnosis not present

## 2020-01-25 DIAGNOSIS — M25461 Effusion, right knee: Secondary | ICD-10-CM | POA: Diagnosis not present

## 2020-02-01 ENCOUNTER — Telehealth: Payer: Self-pay | Admitting: Internal Medicine

## 2020-02-01 NOTE — Telephone Encounter (Signed)
Recv'd records from [facility] forwarded 3 pages to Dr. Cathlean Cower 5/27/21fbg

## 2020-02-07 ENCOUNTER — Telehealth: Payer: Self-pay | Admitting: Internal Medicine

## 2020-02-07 NOTE — Telephone Encounter (Signed)
Recv'd records from [facility] forwarded to Dr. Cathlean Cower 6/2/21fbg

## 2020-04-02 DIAGNOSIS — Z1231 Encounter for screening mammogram for malignant neoplasm of breast: Secondary | ICD-10-CM | POA: Diagnosis not present

## 2020-04-02 DIAGNOSIS — Z124 Encounter for screening for malignant neoplasm of cervix: Secondary | ICD-10-CM | POA: Diagnosis not present

## 2020-04-02 LAB — HM MAMMOGRAPHY

## 2020-04-04 DIAGNOSIS — Z01818 Encounter for other preprocedural examination: Secondary | ICD-10-CM | POA: Diagnosis not present

## 2020-04-07 HISTORY — PX: REPLACEMENT TOTAL KNEE: SUR1224

## 2020-04-09 ENCOUNTER — Encounter: Payer: Self-pay | Admitting: Internal Medicine

## 2020-04-11 DIAGNOSIS — M1711 Unilateral primary osteoarthritis, right knee: Secondary | ICD-10-CM | POA: Diagnosis not present

## 2020-04-11 DIAGNOSIS — M25561 Pain in right knee: Secondary | ICD-10-CM | POA: Diagnosis not present

## 2020-04-17 ENCOUNTER — Encounter: Payer: Self-pay | Admitting: Internal Medicine

## 2020-04-17 ENCOUNTER — Ambulatory Visit (INDEPENDENT_AMBULATORY_CARE_PROVIDER_SITE_OTHER): Payer: Medicare Other | Admitting: Internal Medicine

## 2020-04-17 ENCOUNTER — Other Ambulatory Visit: Payer: Self-pay

## 2020-04-17 VITALS — BP 150/82 | HR 72 | Temp 98.5°F | Ht 65.0 in | Wt 174.0 lb

## 2020-04-17 DIAGNOSIS — R7302 Impaired glucose tolerance (oral): Secondary | ICD-10-CM

## 2020-04-17 DIAGNOSIS — F329 Major depressive disorder, single episode, unspecified: Secondary | ICD-10-CM | POA: Diagnosis not present

## 2020-04-17 DIAGNOSIS — F32A Depression, unspecified: Secondary | ICD-10-CM

## 2020-04-17 DIAGNOSIS — K219 Gastro-esophageal reflux disease without esophagitis: Secondary | ICD-10-CM

## 2020-04-17 DIAGNOSIS — I10 Essential (primary) hypertension: Secondary | ICD-10-CM | POA: Insufficient documentation

## 2020-04-17 DIAGNOSIS — E785 Hyperlipidemia, unspecified: Secondary | ICD-10-CM

## 2020-04-17 MED ORDER — LOSARTAN POTASSIUM 50 MG PO TABS: 50.0000 mg | ORAL_TABLET | Freq: Every day | ORAL | 3 refills | Status: DC

## 2020-04-17 NOTE — Patient Instructions (Addendum)
Your A1c was Ok today  Please take all new medication as prescribed - the losartan 50 mg per day  Please continue to monitor your BP at home with the goal to be less than 140/90 (most of the time at least)  Please continue all other medications as before, and refills have been done if requested.  Please have the pharmacy call with any other refills you may need.  Please continue your efforts at being more active, low cholesterol diet, and weight control.  Please keep your appointments with your specialists as you may have planned  Please make an Appointment to return in 6 months, or sooner if needed, also with Lab Appointment for testing done 3-5 days before at the [facility] (so this is for TWO appointments - please see the scheduling desk as you leave)

## 2020-04-17 NOTE — Assessment & Plan Note (Signed)
stable overall by history and exam, recent data reviewed with pt, and pt to continue medical treatment as before,  to f/u any worsening symptoms or concerns  

## 2020-04-17 NOTE — Assessment & Plan Note (Addendum)

## 2020-04-17 NOTE — Assessment & Plan Note (Addendum)
Unocntrolled, for losartan 50 qd  I spent 31 minutes in preparing to see the patient by review of recent labs, imaging and procedures, obtaining and reviewing separately obtained history, communicating with the patient and family or caregiver, ordering medications, tests or procedures, and documenting clinical information in the EHR including the differential Dx, treatment, and any further evaluation and other management of htn, hyperglycemia, hld, gerd, depression

## 2020-04-17 NOTE — Progress Notes (Signed)
Subjective:    Patient ID: Molly Lewis, female    DOB: 1951-07-15, 69 y.o.   MRN: [id number]  HPI   Here to f/u; overall doing ok,  Pt denies chest pain, increasing sob or doe, wheezing, orthopnea, PND, increased LE swelling, palpitations, dizziness or syncope.  Pt denies new neurological symptoms such as new headache, or facial or extremity weakness or numbness.  Pt denies polydipsia, polyuria, or low sugar episode.  Pt states overall good compliance with meds, mostly trying to follow appropriate diet, with wt overall stable,  but little exercise however. BP Readings from Last 3 Encounters:  04/17/20 (!) 150/82  10/19/19 (!) 142/80  06/03/19 (!) 154/73  Denies worsening reflux, abd pain, dysphagia, n/v, bowel change or blood. Denies worsening depressive symptoms, suicidal ideation, or panic  Due for right knee TKR on aug 24. No new complaints Past Medical History:  Diagnosis Date  . Allergic rhinitis, cause unspecified 07/18/2011  . Allergy   . ANXIETY 06/23/2007  . BREAST BIOPSY, HX OF 06/23/2007   benign  . Bursitis   . Cataract   . Chronic back pain   . Chronic constipation    "i have had it all my life and the last time i had a colonoscopy they tol me i have a distened colon because of it"   . COLONIC POLYPS, HX OF 06/27/2008  . COMMON MIGRAINE 06/23/2007  . DEPRESSION 06/23/2007  . GERD 06/23/2007  . HYPERLIPIDEMIA 06/23/2007  . Impaired glucose tolerance 02/16/2011  . INSOMNIA-SLEEP DISORDER-UNSPEC 06/26/2009  . Irritable bowel syndrome 06/23/2007  . Microhematuria 07/31/2015  . OSTEOARTHRITIS, LUMBAR SPINE 06/23/2007  . OSTEOPENIA 07/11/2008   Past Surgical History:  Procedure Laterality Date  . APPENDECTOMY    . CATARACT EXTRACTION     BIL  . colonoscopy  02/2019  . COLONOSCOPY WITH PROPOFOL N/A 03/29/2019   Procedure: COLONOSCOPY WITH PROPOFOL;  Surgeon: Rush Landmark Telford Nab., MD;  Location: [facility];  Service: Gastroenterology;  Laterality: N/A;  .  ENDOSCOPIC MUCOSAL RESECTION N/A 03/29/2019   Procedure: ENDOSCOPIC MUCOSAL RESECTION;  Surgeon: Rush Landmark Telford Nab., MD;  Location: [facility];  Service: Gastroenterology;  Laterality: N/A;  . HEMOSTASIS CLIP PLACEMENT  03/29/2019   Procedure: HEMOSTASIS CLIP PLACEMENT;  Surgeon: Irving Copas., MD;  Location: [facility];  Service: Gastroenterology;;  . RETINAL DETACHMENT SURGERY Right 01/2019  . RETINAL TEAR REPAIR CRYOTHERAPY Right   . SUBMUCOSAL LIFTING INJECTION  03/29/2019   Procedure: SUBMUCOSAL LIFTING INJECTION;  Surgeon: Rush Landmark Telford Nab., MD;  Location: [facility];  Service: Gastroenterology;;  . TOTAL ABDOMINAL HYSTERECTOMY      reports that she quit smoking about 11 years ago. Her smoking use included cigarettes. She has never used smokeless tobacco. She reports that she does not drink alcohol and does not use drugs. family history includes Brain cancer in her maternal grandmother and mother; Heart disease in her father; Hypertension in her father. Allergies  Allergen Reactions  . Codeine Nausea And Vomiting    Violent vomiting   Current Outpatient Medications on File Prior to Visit  Medication Sig Dispense Refill  . ALPRAZolam (XANAX) 1 MG tablet TAKE 1 TABLET(1 MG) BY MOUTH TWICE DAILY AS NEEDED 60 tablet 5  . aspirin EC 81 MG tablet Take 81 mg by mouth daily.    Marland Kitchen atorvastatin (LIPITOR) 80 MG tablet TAKE 1 TABLET BY MOUTH DAILY 90 tablet 2  . Calcium Carbonate-Vitamin D (CALCIUM-D PO) Take 1,200 mg by mouth daily.    . celecoxib (  CELEBREX) 200 MG capsule TAKE 1 CAPSULE(200 MG) BY MOUTH TWICE DAILY AS NEEDED FOR MILD PAIN OR MODERATE PAIN 180 capsule 1  . metroNIDAZOLE (METROGEL) 0.75 % gel Apply 1 application topically 2 (two) times daily.   2  . Multiple Vitamin (MULTIVITAMIN WITH MINERALS) TABS tablet Take 1 tablet by mouth daily.    Marland Kitchen omeprazole (PRILOSEC) 20 MG capsule Take 1 capsule by mouth two times daily 180 capsule 3  . Probiotic Product  (PROBIOTIC DAILY PO) Take 1 capsule by mouth daily.    Marland Kitchen VALIUM 5 MG tablet Take 2 tablet by mouth   one hour prior to exam, take one additional if needed, driver needed     No current facility-administered medications on file prior to visit.   Review of Systems All otherwise neg per pt     Objective:   Physical Exam BP (!) 150/82 (BP Location: Left Arm, Patient Position: Sitting, Cuff Size: Large)   Pulse 72   Temp 98.5 F (36.9 C) (Oral)   Ht 5\' 5"  (1.651 m)   Wt 174 lb (78.9 kg)   SpO2 98%   BMI 28.96 kg/m  VS noted,  Constitutional: Pt appears in NAD HENT: Head: NCAT.  Right Ear: External ear normal.  Left Ear: External ear normal.  Eyes: . Pupils are equal, round, and reactive to light. Conjunctivae and EOM are normal Nose: without d/c or deformity Neck: Neck supple. Gross normal ROM Cardiovascular: Normal rate and regular rhythm.   Pulmonary/Chest: Effort normal and breath sounds without rales or wheezing.  Abd:  Soft, NT, ND, + BS, no organomegaly Neurological: Pt is alert. At baseline orientation, motor grossly intact Skin: Skin is warm. No rashes, other new lesions, no LE edema Psychiatric: Pt behavior is normal without agitation  All otherwise neg per pt  Lab Results  Component Value Date   WBC 4.5 10/19/2019   HGB 13.8 10/19/2019   HCT 40.9 10/19/2019   PLT 158.0 10/19/2019   GLUCOSE 107 (H) 10/19/2019   CHOL 144 10/19/2019   TRIG 138.0 10/19/2019   HDL 43.60 10/19/2019   LDLDIRECT 126.0 09/04/2016   LDLCALC 73 10/19/2019   ALT 54 (H) 10/19/2019   AST 37 10/19/2019   NA 140 10/19/2019   K 3.8 10/19/2019   CL 104 10/19/2019   CREATININE 0.88 10/19/2019   BUN 23 10/19/2019   CO2 30 10/19/2019   TSH 1.02 10/19/2019   HGBA1C 6.3 10/19/2019       Assessment & Plan:

## 2020-04-18 ENCOUNTER — Telehealth: Payer: Self-pay | Admitting: Internal Medicine

## 2020-04-18 ENCOUNTER — Other Ambulatory Visit: Payer: Self-pay

## 2020-04-18 MED ORDER — LOSARTAN POTASSIUM 50 MG PO TABS: 50.0000 mg | ORAL_TABLET | Freq: Every day | ORAL | 3 refills | Status: DC

## 2020-04-18 NOTE — Telephone Encounter (Signed)
New message:   Pt is calling and states the medication sent in for her yesterday( losartan (COZAAR) 50 MG tablet) needs to be resent to [place] [place] [facility] [place] due to the other [facility] system being down and unable to receive the request. Please advise.

## 2020-04-18 NOTE — Telephone Encounter (Signed)
Resent in for patient today. 

## 2020-04-24 ENCOUNTER — Telehealth: Payer: Self-pay | Admitting: Internal Medicine

## 2020-04-24 MED ORDER — LOSARTAN POTASSIUM 100 MG PO TABS: 100.0000 mg | ORAL_TABLET | Freq: Every day | ORAL | 3 refills | Status: DC

## 2020-04-24 NOTE — Telephone Encounter (Signed)
BP Readings from Last 3 Encounters:  04/17/20 (!) 150/82  10/19/19 (!) 142/80  06/03/19 (!) 154/73   Since you cant feel high blood pressure, you must be feeing the anxiety.  Of course BP has been elevated last few visit as well.   Ok to increase the losarta 100 qd - done erx and continue to monitor BP at home and next visit

## 2020-04-24 NOTE — Telephone Encounter (Signed)
New Message:  Pt is calling and states since starting the BP medication she feels it just keeps going up. A couple readings since taking the medication are 174/96, 185/86, 189/94, and this morning 201/94. Pt states she is unsure if it is her anxiety that is making it go up but would like the dr's feedback. Please advise.

## 2020-04-24 NOTE — Addendum Note (Signed)
Addended by: Biagio Borg on: 04/24/2020 12:54 PM   Modules accepted: Orders

## 2020-04-30 DIAGNOSIS — M1711 Unilateral primary osteoarthritis, right knee: Secondary | ICD-10-CM | POA: Diagnosis not present

## 2020-04-30 DIAGNOSIS — G8918 Other acute postprocedural pain: Secondary | ICD-10-CM | POA: Diagnosis not present

## 2020-05-02 DIAGNOSIS — H409 Unspecified glaucoma: Secondary | ICD-10-CM | POA: Diagnosis not present

## 2020-05-02 DIAGNOSIS — Z471 Aftercare following joint replacement surgery: Secondary | ICD-10-CM | POA: Diagnosis not present

## 2020-05-02 DIAGNOSIS — Z87891 Personal history of nicotine dependence: Secondary | ICD-10-CM | POA: Diagnosis not present

## 2020-05-02 DIAGNOSIS — K5900 Constipation, unspecified: Secondary | ICD-10-CM | POA: Diagnosis not present

## 2020-05-02 DIAGNOSIS — F419 Anxiety disorder, unspecified: Secondary | ICD-10-CM | POA: Diagnosis not present

## 2020-05-02 DIAGNOSIS — I10 Essential (primary) hypertension: Secondary | ICD-10-CM | POA: Diagnosis not present

## 2020-05-02 DIAGNOSIS — K219 Gastro-esophageal reflux disease without esophagitis: Secondary | ICD-10-CM | POA: Diagnosis not present

## 2020-05-02 DIAGNOSIS — Z96651 Presence of right artificial knee joint: Secondary | ICD-10-CM | POA: Diagnosis not present

## 2020-05-06 DIAGNOSIS — K219 Gastro-esophageal reflux disease without esophagitis: Secondary | ICD-10-CM | POA: Diagnosis not present

## 2020-05-06 DIAGNOSIS — I10 Essential (primary) hypertension: Secondary | ICD-10-CM | POA: Diagnosis not present

## 2020-05-06 DIAGNOSIS — Z471 Aftercare following joint replacement surgery: Secondary | ICD-10-CM | POA: Diagnosis not present

## 2020-05-06 DIAGNOSIS — F419 Anxiety disorder, unspecified: Secondary | ICD-10-CM | POA: Diagnosis not present

## 2020-05-06 DIAGNOSIS — K5900 Constipation, unspecified: Secondary | ICD-10-CM | POA: Diagnosis not present

## 2020-05-06 DIAGNOSIS — H409 Unspecified glaucoma: Secondary | ICD-10-CM | POA: Diagnosis not present

## 2020-05-08 DIAGNOSIS — F419 Anxiety disorder, unspecified: Secondary | ICD-10-CM | POA: Diagnosis not present

## 2020-05-08 DIAGNOSIS — K219 Gastro-esophageal reflux disease without esophagitis: Secondary | ICD-10-CM | POA: Diagnosis not present

## 2020-05-08 DIAGNOSIS — I10 Essential (primary) hypertension: Secondary | ICD-10-CM | POA: Diagnosis not present

## 2020-05-08 DIAGNOSIS — Z471 Aftercare following joint replacement surgery: Secondary | ICD-10-CM | POA: Diagnosis not present

## 2020-05-08 DIAGNOSIS — K5900 Constipation, unspecified: Secondary | ICD-10-CM | POA: Diagnosis not present

## 2020-05-08 DIAGNOSIS — H409 Unspecified glaucoma: Secondary | ICD-10-CM | POA: Diagnosis not present

## 2020-05-15 DIAGNOSIS — Z471 Aftercare following joint replacement surgery: Secondary | ICD-10-CM | POA: Diagnosis not present

## 2020-05-15 DIAGNOSIS — K5900 Constipation, unspecified: Secondary | ICD-10-CM | POA: Diagnosis not present

## 2020-05-15 DIAGNOSIS — F419 Anxiety disorder, unspecified: Secondary | ICD-10-CM | POA: Diagnosis not present

## 2020-05-15 DIAGNOSIS — K219 Gastro-esophageal reflux disease without esophagitis: Secondary | ICD-10-CM | POA: Diagnosis not present

## 2020-05-15 DIAGNOSIS — H409 Unspecified glaucoma: Secondary | ICD-10-CM | POA: Diagnosis not present

## 2020-05-15 DIAGNOSIS — I10 Essential (primary) hypertension: Secondary | ICD-10-CM | POA: Diagnosis not present

## 2020-05-17 DIAGNOSIS — F419 Anxiety disorder, unspecified: Secondary | ICD-10-CM | POA: Diagnosis not present

## 2020-05-17 DIAGNOSIS — K219 Gastro-esophageal reflux disease without esophagitis: Secondary | ICD-10-CM | POA: Diagnosis not present

## 2020-05-17 DIAGNOSIS — Z471 Aftercare following joint replacement surgery: Secondary | ICD-10-CM | POA: Diagnosis not present

## 2020-05-17 DIAGNOSIS — K5900 Constipation, unspecified: Secondary | ICD-10-CM | POA: Diagnosis not present

## 2020-05-17 DIAGNOSIS — H409 Unspecified glaucoma: Secondary | ICD-10-CM | POA: Diagnosis not present

## 2020-05-17 DIAGNOSIS — I10 Essential (primary) hypertension: Secondary | ICD-10-CM | POA: Diagnosis not present

## 2020-05-20 DIAGNOSIS — K5900 Constipation, unspecified: Secondary | ICD-10-CM | POA: Diagnosis not present

## 2020-05-20 DIAGNOSIS — H409 Unspecified glaucoma: Secondary | ICD-10-CM | POA: Diagnosis not present

## 2020-05-20 DIAGNOSIS — F419 Anxiety disorder, unspecified: Secondary | ICD-10-CM | POA: Diagnosis not present

## 2020-05-20 DIAGNOSIS — Z471 Aftercare following joint replacement surgery: Secondary | ICD-10-CM | POA: Diagnosis not present

## 2020-05-20 DIAGNOSIS — K219 Gastro-esophageal reflux disease without esophagitis: Secondary | ICD-10-CM | POA: Diagnosis not present

## 2020-05-20 DIAGNOSIS — I10 Essential (primary) hypertension: Secondary | ICD-10-CM | POA: Diagnosis not present

## 2020-05-23 DIAGNOSIS — K219 Gastro-esophageal reflux disease without esophagitis: Secondary | ICD-10-CM | POA: Diagnosis not present

## 2020-05-23 DIAGNOSIS — K5900 Constipation, unspecified: Secondary | ICD-10-CM | POA: Diagnosis not present

## 2020-05-23 DIAGNOSIS — I10 Essential (primary) hypertension: Secondary | ICD-10-CM | POA: Diagnosis not present

## 2020-05-23 DIAGNOSIS — Z471 Aftercare following joint replacement surgery: Secondary | ICD-10-CM | POA: Diagnosis not present

## 2020-05-23 DIAGNOSIS — H409 Unspecified glaucoma: Secondary | ICD-10-CM | POA: Diagnosis not present

## 2020-05-23 DIAGNOSIS — F419 Anxiety disorder, unspecified: Secondary | ICD-10-CM | POA: Diagnosis not present

## 2020-05-25 ENCOUNTER — Other Ambulatory Visit: Payer: Self-pay | Admitting: Internal Medicine

## 2020-05-25 NOTE — Telephone Encounter (Signed)
Done erx 

## 2020-05-27 DIAGNOSIS — K219 Gastro-esophageal reflux disease without esophagitis: Secondary | ICD-10-CM | POA: Diagnosis not present

## 2020-05-27 DIAGNOSIS — I10 Essential (primary) hypertension: Secondary | ICD-10-CM | POA: Diagnosis not present

## 2020-05-27 DIAGNOSIS — K5900 Constipation, unspecified: Secondary | ICD-10-CM | POA: Diagnosis not present

## 2020-05-27 DIAGNOSIS — F419 Anxiety disorder, unspecified: Secondary | ICD-10-CM | POA: Diagnosis not present

## 2020-05-27 DIAGNOSIS — Z471 Aftercare following joint replacement surgery: Secondary | ICD-10-CM | POA: Diagnosis not present

## 2020-05-27 DIAGNOSIS — H409 Unspecified glaucoma: Secondary | ICD-10-CM | POA: Diagnosis not present

## 2020-05-30 DIAGNOSIS — Z471 Aftercare following joint replacement surgery: Secondary | ICD-10-CM | POA: Diagnosis not present

## 2020-05-30 DIAGNOSIS — F419 Anxiety disorder, unspecified: Secondary | ICD-10-CM | POA: Diagnosis not present

## 2020-05-30 DIAGNOSIS — K5900 Constipation, unspecified: Secondary | ICD-10-CM | POA: Diagnosis not present

## 2020-05-30 DIAGNOSIS — I10 Essential (primary) hypertension: Secondary | ICD-10-CM | POA: Diagnosis not present

## 2020-05-30 DIAGNOSIS — H409 Unspecified glaucoma: Secondary | ICD-10-CM | POA: Diagnosis not present

## 2020-05-30 DIAGNOSIS — K219 Gastro-esophageal reflux disease without esophagitis: Secondary | ICD-10-CM | POA: Diagnosis not present

## 2020-06-01 DIAGNOSIS — Z471 Aftercare following joint replacement surgery: Secondary | ICD-10-CM | POA: Diagnosis not present

## 2020-06-04 DIAGNOSIS — Z96651 Presence of right artificial knee joint: Secondary | ICD-10-CM | POA: Diagnosis not present

## 2020-06-04 DIAGNOSIS — Z471 Aftercare following joint replacement surgery: Secondary | ICD-10-CM | POA: Diagnosis not present

## 2020-06-21 ENCOUNTER — Other Ambulatory Visit: Payer: Self-pay | Admitting: Internal Medicine

## 2020-06-27 DIAGNOSIS — Z23 Encounter for immunization: Secondary | ICD-10-CM | POA: Diagnosis not present

## 2020-07-19 DIAGNOSIS — M25561 Pain in right knee: Secondary | ICD-10-CM | POA: Diagnosis not present

## 2020-07-20 DIAGNOSIS — Z23 Encounter for immunization: Secondary | ICD-10-CM | POA: Diagnosis not present

## 2020-07-24 DIAGNOSIS — M25661 Stiffness of right knee, not elsewhere classified: Secondary | ICD-10-CM | POA: Diagnosis not present

## 2020-07-26 DIAGNOSIS — M25661 Stiffness of right knee, not elsewhere classified: Secondary | ICD-10-CM | POA: Diagnosis not present

## 2020-07-29 DIAGNOSIS — M25661 Stiffness of right knee, not elsewhere classified: Secondary | ICD-10-CM | POA: Diagnosis not present

## 2020-08-06 ENCOUNTER — Telehealth: Payer: Self-pay | Admitting: Gastroenterology

## 2020-08-07 DIAGNOSIS — M25661 Stiffness of right knee, not elsewhere classified: Secondary | ICD-10-CM | POA: Diagnosis not present

## 2020-08-07 NOTE — Telephone Encounter (Signed)
Left message on machine to call back  

## 2020-08-08 ENCOUNTER — Other Ambulatory Visit: Payer: Self-pay

## 2020-08-08 DIAGNOSIS — Z86010 Personal history of colonic polyps: Secondary | ICD-10-CM

## 2020-08-08 NOTE — Telephone Encounter (Signed)
09/19/20 at 930 am colon scheduled with Dr Rush Landmark at [facility] test on 1/10

## 2020-08-08 NOTE — Telephone Encounter (Signed)
COVID test at 920 am   Left message on machine to call back

## 2020-08-09 NOTE — Telephone Encounter (Signed)
Left message on machine to call back  

## 2020-08-12 ENCOUNTER — Telehealth: Payer: Self-pay | Admitting: Gastroenterology

## 2020-08-12 NOTE — Telephone Encounter (Signed)
Spoke with patient in regards to her upcoming appointments. Patient is aware that she will need a pre-visit on 08/29/20 at 9 AM, pt states that she prefers [facility]. Patient verbalized understanding of all information and had no other concerns at the end of the call.

## 2020-08-12 NOTE — Telephone Encounter (Signed)
See alternate phone note for more information 

## 2020-09-04 ENCOUNTER — Other Ambulatory Visit: Payer: Self-pay | Admitting: Internal Medicine

## 2020-09-10 ENCOUNTER — Ambulatory Visit (AMBULATORY_SURGERY_CENTER): Payer: Self-pay | Admitting: *Deleted

## 2020-09-10 ENCOUNTER — Other Ambulatory Visit: Payer: Self-pay

## 2020-09-10 VITALS — Ht 65.0 in | Wt 175.0 lb

## 2020-09-10 DIAGNOSIS — Z86010 Personal history of colonic polyps: Secondary | ICD-10-CM

## 2020-09-10 NOTE — Progress Notes (Addendum)
No egg or soy allergy known to patient  No issues with past sedation with any surgeries or procedures No intubation problems in the past  No FH of Malignant Hyperthermia No diet pills per patient No home 02 use per patient  No blood thinners per patient  Pt denies issues with constipation  No A fib or A flutter  EMMI video to pt or via MyChart  COVID 19 guidelines implemented in [facility] today with Pt and RN  Pt is fully vaccinated  for Covid   Sample [facility]  provided   Due to the COVID-19 pandemic we are asking patients to follow certain guidelines.  Pt aware of COVID protocols and [facility] guidelines

## 2020-09-12 ENCOUNTER — Other Ambulatory Visit (HOSPITAL_COMMUNITY): Payer: Medicare Other

## 2020-09-16 ENCOUNTER — Other Ambulatory Visit (HOSPITAL_COMMUNITY)
Admission: RE | Admit: 2020-09-16 | Discharge: 2020-09-16 | Disposition: A | Discharge status: Home / Self Care | Payer: Medicare Other | Source: Ambulatory Visit | Attending: Gastroenterology | Admitting: Gastroenterology

## 2020-09-16 DIAGNOSIS — Z01812 Encounter for preprocedural laboratory examination: Secondary | ICD-10-CM | POA: Diagnosis not present

## 2020-09-16 DIAGNOSIS — Z20822 Contact with and (suspected) exposure to covid-19: Secondary | ICD-10-CM | POA: Diagnosis not present

## 2020-09-16 LAB — SARS CORONAVIRUS 2 (TAT 6-24 HRS): SARS Coronavirus 2: NEGATIVE

## 2020-09-17 ENCOUNTER — Encounter: Payer: Medicare Other | Admitting: Gastroenterology

## 2020-09-18 ENCOUNTER — Encounter (HOSPITAL_COMMUNITY): Payer: Self-pay | Admitting: Gastroenterology

## 2020-09-18 ENCOUNTER — Other Ambulatory Visit: Payer: Self-pay

## 2020-09-18 NOTE — Progress Notes (Signed)
Patient denies SOB, fever, cough or chest congestion.    PCP - Dr Cathlean Cower Cardiologist - n/a [facility] - Dr Silverio Decamp  Chest x-ray - n/a EKG - DOS 09/19/20 Stress Test - n/a ECHO - n/a Cardiac Cath - n/a  STOP now taking any Aspirin (unless otherwise instructed by your surgeon), Aleve, Naproxen, Ibuprofen, Motrin, Advil, Goody's, BC's, all herbal medications, fish oil, and all vitamins.   Coronavirus Screening Covid test on 09/17/19 was negative.    Patient verbalized understanding of instructions that were given via phone.

## 2020-09-18 NOTE — Anesthesia Preprocedure Evaluation (Signed)
Anesthesia Evaluation  Patient identified by MRN, date of birth, ID band Patient awake    Reviewed: Allergy & Precautions, NPO status , Patient's Chart, lab work & pertinent test results  Airway Mallampati: II  TM Distance: >3 FB Neck ROM: Full    Dental no notable dental hx. (+) Teeth Intact   Pulmonary neg pulmonary ROS, former smoker,    Pulmonary exam normal breath sounds clear to auscultation       Cardiovascular Exercise Tolerance: Good hypertension, Pt. on medications negative cardio ROS Normal cardiovascular exam Rhythm:Regular Rate:Normal     Neuro/Psych  Headaches, PSYCHIATRIC DISORDERS Anxiety Depression    GI/Hepatic Neg liver ROS, GERD  Medicated and Controlled,Hx/o colon polyps   Endo/Other  negative endocrine ROSOsteopenia Hyperlipidemia   Renal/GU negative Renal ROS  negative genitourinary   Musculoskeletal  (+) Arthritis , Osteoarthritis,    Abdominal   Peds  Hematology negative hematology ROS (+)   Anesthesia Other Findings   Reproductive/Obstetrics                            Anesthesia Physical  Anesthesia Plan  ASA: II  Anesthesia Plan: MAC   Post-op Pain Management:    Induction: Intravenous  PONV Risk Score and Plan: 2 and Treatment may vary due to age or medical condition and Propofol infusion  Airway Management Planned: Natural Airway and Nasal Cannula  Additional Equipment:   Intra-op Plan:   Post-operative Plan:   Informed Consent: I have reviewed the patients History and Physical, chart, labs and discussed the procedure including the risks, benefits and alternatives for the proposed anesthesia with the patient or authorized representative who has indicated his/her understanding and acceptance.     Dental advisory given  Plan Discussed with: CRNA and Anesthesiologist  Anesthesia Plan Comments:        Anesthesia Quick Evaluation

## 2020-09-19 ENCOUNTER — Ambulatory Visit (HOSPITAL_COMMUNITY): Payer: Medicare Other | Admitting: Anesthesiology

## 2020-09-19 ENCOUNTER — Encounter (HOSPITAL_COMMUNITY): Payer: Self-pay | Admitting: Gastroenterology

## 2020-09-19 ENCOUNTER — Other Ambulatory Visit: Payer: Self-pay

## 2020-09-19 ENCOUNTER — Ambulatory Visit (HOSPITAL_COMMUNITY)
Admission: RE | Admit: 2020-09-19 | Discharge: 2020-09-19 | Disposition: A | Discharge status: Home / Self Care | Payer: Medicare Other | Attending: Gastroenterology | Admitting: Gastroenterology

## 2020-09-19 ENCOUNTER — Encounter (HOSPITAL_COMMUNITY)
Admission: RE | Disposition: A | Discharge status: Home / Self Care | Payer: Self-pay | Source: Home / Self Care | Attending: Gastroenterology

## 2020-09-19 DIAGNOSIS — Q438 Other specified congenital malformations of intestine: Secondary | ICD-10-CM | POA: Diagnosis not present

## 2020-09-19 DIAGNOSIS — K649 Unspecified hemorrhoids: Secondary | ICD-10-CM | POA: Diagnosis not present

## 2020-09-19 DIAGNOSIS — D124 Benign neoplasm of descending colon: Secondary | ICD-10-CM | POA: Diagnosis not present

## 2020-09-19 DIAGNOSIS — D128 Benign neoplasm of rectum: Secondary | ICD-10-CM

## 2020-09-19 DIAGNOSIS — D126 Benign neoplasm of colon, unspecified: Secondary | ICD-10-CM | POA: Diagnosis not present

## 2020-09-19 DIAGNOSIS — Z9889 Other specified postprocedural states: Secondary | ICD-10-CM | POA: Diagnosis not present

## 2020-09-19 DIAGNOSIS — K644 Residual hemorrhoidal skin tags: Secondary | ICD-10-CM | POA: Insufficient documentation

## 2020-09-19 DIAGNOSIS — K641 Second degree hemorrhoids: Secondary | ICD-10-CM | POA: Diagnosis not present

## 2020-09-19 DIAGNOSIS — D123 Benign neoplasm of transverse colon: Secondary | ICD-10-CM

## 2020-09-19 DIAGNOSIS — K5730 Diverticulosis of large intestine without perforation or abscess without bleeding: Secondary | ICD-10-CM | POA: Insufficient documentation

## 2020-09-19 DIAGNOSIS — Z87891 Personal history of nicotine dependence: Secondary | ICD-10-CM | POA: Insufficient documentation

## 2020-09-19 DIAGNOSIS — K621 Rectal polyp: Secondary | ICD-10-CM | POA: Diagnosis not present

## 2020-09-19 DIAGNOSIS — K635 Polyp of colon: Secondary | ICD-10-CM | POA: Insufficient documentation

## 2020-09-19 DIAGNOSIS — Z09 Encounter for follow-up examination after completed treatment for conditions other than malignant neoplasm: Secondary | ICD-10-CM | POA: Diagnosis present

## 2020-09-19 DIAGNOSIS — Z86010 Personal history of colonic polyps: Secondary | ICD-10-CM | POA: Diagnosis not present

## 2020-09-19 HISTORY — PX: POLYPECTOMY: SHX5525

## 2020-09-19 HISTORY — DX: Prediabetes: R73.03

## 2020-09-19 HISTORY — PX: COLONOSCOPY WITH PROPOFOL: SHX5780

## 2020-09-19 HISTORY — PX: BIOPSY: SHX5522

## 2020-09-19 HISTORY — DX: Essential (primary) hypertension: I10

## 2020-09-19 SURGERY — COLONOSCOPY WITH PROPOFOL
Anesthesia: Monitor Anesthesia Care

## 2020-09-19 MED ORDER — LACTATED RINGERS IV SOLN: INTRAVENOUS | Status: DC

## 2020-09-19 MED ORDER — PROPOFOL 10 MG/ML IV BOLUS
INTRAVENOUS | Status: DC | PRN
  Administered 2020-09-19: 75 mg via INTRAVENOUS
  Administered 2020-09-19: 20 mg via INTRAVENOUS

## 2020-09-19 MED ORDER — SODIUM CHLORIDE 0.9 % IV SOLN: INTRAVENOUS | Status: DC

## 2020-09-19 MED ORDER — PROPOFOL 500 MG/50ML IV EMUL
INTRAVENOUS | Status: DC | PRN
  Administered 2020-09-19: 150 ug/kg/min via INTRAVENOUS

## 2020-09-19 MED ORDER — GLYCOPYRROLATE 0.2 MG/ML IJ SOLN
INTRAMUSCULAR | Status: DC | PRN
  Administered 2020-09-19: .2 mg via INTRAVENOUS

## 2020-09-19 SURGICAL SUPPLY — 21 items

## 2020-09-19 NOTE — Op Note (Signed)
[facility] Patient Name: Molly Lewis Procedure Date : 09/19/2020 MRN: [id number] Attending MD: Justice Britain , MD Date of Birth: 1951/05/08 CSN: [id number] Age: 70 Admit Type: Outpatient Procedure:                Colonoscopy Indications:              High risk colon cancer surveillance: Personal                            history of sessile serrated colon polyp (25 mm)                            removed in 2020 here for first follow up Providers:                Justice Britain, MD, Kary Kos RN, RN,                            Laverda Sorenson, Technician, Cleda Daub, CRNA Referring MD:             Mauri Pole, MD, Biagio Borg, MD Medicines:                Monitored Anesthesia Care Complications:            No immediate complications. Estimated Blood Loss:     Estimated blood loss was minimal. Procedure:                Pre-Anesthesia Assessment:                           - Prior to the procedure, a History and Physical                            was performed, and patient medications and                            allergies were reviewed. The patient's tolerance of                            previous anesthesia was also reviewed. The risks                            and benefits of the procedure and the sedation                            options and risks were discussed with the patient.                            All questions were answered, and informed consent                            was obtained. Prior Anticoagulants: The patient has                            taken no previous anticoagulant or antiplatelet  agents. ASA Grade Assessment: II - A patient with                            mild systemic disease. After reviewing the risks                            and benefits, the patient was deemed in                            satisfactory condition to undergo the procedure.                           After obtaining  informed consent, the colonoscope                            was passed under direct vision. Throughout the                            procedure, the patient's blood pressure, pulse, and                            oxygen saturations were monitored continuously. The                            PCF-H190DL ([id number]) Olympus pediatric colonscope                            was introduced through the anus and advanced to the                            the cecum, identified by appendiceal orifice and                            ileocecal valve. The colonoscopy was extremely                            difficult due to a redundant colon, significant                            looping and a tortuous colon. Successful completion                            of the procedure was aided by changing the patient                            to a supine position, withdrawing and reinserting                            the scope, straightening and shortening the scope                            to obtain bowel loop reduction, using scope torsion  and applying abdominal pressure. The patient                            tolerated the procedure. The quality of the bowel                            preparation was adequate. The ileocecal valve,                            appendiceal orifice, and rectum were photographed. Scope In: 9:11:50 AM Scope Out: 9:49:16 AM Scope Withdrawal Time: 0 hours 19 minutes 46 seconds  Total Procedure Duration: 0 hours 37 minutes 26 seconds  Findings:      The digital rectal exam findings include hemorrhoids. Pertinent       negatives include no palpable rectal lesions.      The colon (entire examined portion) revealed grossly excessive looping.      A medium post mucosectomy scar was found in the proximal ascending       colon. There was one small area of polypoid tissue that looked to likely       be clip artifact, but to ensure this, it was removed with a cold  snare.       Resection and retrieval were complete.      Seven sessile polyps were found in the rectum (1), recto-sigmoid colon       (1), descending colon (1), transverse colon (1), ascending colon (2) and       cecum (1). The polyps were 2 to 6 mm in size. These polyps were removed       with a cold snare. Resection and retrieval were complete.      Multiple small-mouthed diverticula were found in the recto-sigmoid colon.      Non-bleeding non-thrombosed external and internal hemorrhoids were found       during retroflexion, during perianal exam and during digital exam. The       hemorrhoids were Grade II (internal hemorrhoids that prolapse but reduce       spontaneously). Impression:               - Hemorrhoids found on digital rectal exam.                           - There was significant looping of the colon                            requiring manipulation as noted above and eventual                            supine positioning.                           - Post mucosectomy scar in the proximal ascending                            colon. Small polypoid tissue was noted at scar,                            this looks to be clip artifact but was  removed.                           - Seven 2 to 6 mm polyps in the rectum, at the                            recto-sigmoid colon, in the descending colon, in                            the transverse colon, in the ascending colon and in                            the cecum, removed with a cold snare. Resected and                            retrieved.                           - Diverticulosis in the recto-sigmoid colon.                           - Non-bleeding non-thrombosed external and internal                            hemorrhoids. Recommendation:           - The patient will be observed post-procedure,                            until all discharge criteria are met.                           - Discharge patient to home.                            - Patient has a contact number available for                            emergencies. The signs and symptoms of potential                            delayed complications were discussed with the                            patient. Return to normal activities tomorrow.                            Written discharge instructions were provided to the                            patient.                           - Resume previous diet.                           -  Continue present medications.                           - Await pathology results.                           - Repeat colonoscopy for surveillance based on                            pathology results. If the EMR scar site sampling                            shows no evidence of precancerous tissue then would                            recommend a 3-year follow up. If any adenomatous                            tissue is found at EMR scar site tissue then repeat                            in 1-year.                           - The findings and recommendations were discussed                            with the patient.                           - The findings and recommendations were discussed                            with the patient's family. Procedure Code(s):        --- Professional ---                           [phone], Colonoscopy, flexible; with removal of                            tumor(s), polyp(s), or other lesion(s) by snare                            technique Diagnosis Code(s):        --- Professional ---                           Z86.010, Personal history of colonic polyps                           K64.1, Second degree hemorrhoids                           Z98.890, Other specified postprocedural states  K62.1, Rectal polyp                           K63.5, Polyp of colon                           K57.30, Diverticulosis of large intestine without                            perforation or  abscess without bleeding CPT copyright 2019 [employer]. All rights reserved. The codes documented in this report are preliminary and upon coder review may  be revised to meet current compliance requirements. Justice Britain, MD 09/19/2020 10:34:14 AM Number of Addenda: 0

## 2020-09-19 NOTE — Discharge Instructions (Signed)
YOU HAD AN ENDOSCOPIC PROCEDURE TODAY: Refer to the procedure report and other information in the discharge instructions given to you for any specific questions about what was found during the examination. If this information does not answer your questions, please call [facility] office at [phone] to clarify.  ° °YOU SHOULD EXPECT: Some feelings of bloating in the abdomen. Passage of more gas than usual. Walking can help get rid of the air that was put into your GI tract during the procedure and reduce the bloating. If you had a lower endoscopy (such as a colonoscopy or flexible sigmoidoscopy) you may notice spotting of blood in your stool or on the toilet paper. Some abdominal soreness may be present for a day or two, also. ° °DIET: Your first meal following the procedure should be a light meal and then it is ok to progress to your normal diet. A half-sandwich or bowl of soup is an example of a good first meal. Heavy or fried foods are harder to digest and may make you feel nauseous or bloated. Drink plenty of fluids but you should avoid alcoholic beverages for 24 hours. If you had a esophageal dilation, please see attached instructions for diet.   ° °ACTIVITY: Your care partner should take you home directly after the procedure. You should plan to take it easy, moving slowly for the rest of the day. You can resume normal activity the day after the procedure however YOU SHOULD NOT DRIVE, use power tools, machinery or perform tasks that involve climbing or major physical exertion for 24 hours (because of the sedation medicines used during the test).  ° °SYMPTOMS TO REPORT IMMEDIATELY: °A gastroenterologist can be reached at any hour. Please call [phone]  for any of the following symptoms:  °Following lower endoscopy (colonoscopy, flexible sigmoidoscopy) °Excessive amounts of blood in the stool  °Significant tenderness, worsening of abdominal pains  °Swelling of the abdomen that is new, acute  °Fever of 100° or  higher  °Following upper endoscopy (EGD, EUS, ERCP, esophageal dilation) °Vomiting of blood or coffee ground material  °New, significant abdominal pain  °New, significant chest pain or pain under the shoulder blades  °Painful or persistently difficult swallowing  °New shortness of breath  °Black, tarry-looking or red, bloody stools ° °FOLLOW UP:  °If any biopsies were taken you will be contacted by phone or by letter within the next 1-3 weeks. Call [phone]  if you have not heard about the biopsies in 3 weeks.  °Please also call with any specific questions about appointments or follow up tests. ° °

## 2020-09-19 NOTE — Anesthesia Postprocedure Evaluation (Signed)
Anesthesia Post Note  Patient: Molly Lewis  Procedure(s) Performed: COLONOSCOPY WITH PROPOFOL (N/A ) POLYPECTOMY BIOPSY     Patient location during evaluation: PACU Anesthesia Type: MAC Level of consciousness: awake and alert Pain management: pain level controlled Vital Signs Assessment: post-procedure vital signs reviewed and stable Respiratory status: spontaneous breathing, nonlabored ventilation and respiratory function stable Cardiovascular status: stable and blood pressure returned to baseline Postop Assessment: no apparent nausea or vomiting Anesthetic complications: no   No complications documented.  Last Vitals:  Vitals:   09/19/20 1010 09/19/20 1015  BP:  (!) 147/62  Pulse: 76 70  Resp: 19 19  Temp:    SpO2: 98% 100%    Last Pain:  Vitals:   09/19/20 1002  TempSrc:   PainSc: 0-No pain                 Romualdo Prosise A.

## 2020-09-19 NOTE — H&P (Signed)
GASTROENTEROLOGY PROCEDURE H&P NOTE   Primary Care Physician: Biagio Borg, MD  HPI: Molly Lewis is a 70 y.o. female who presents for Colonoscopy for follow up of Cecal Polyp s/p EMR in 2020 (first follow up since).  Past Medical History:  Diagnosis Date  . Allergic rhinitis, cause unspecified 07/18/2011  . Allergy   . ANXIETY 06/23/2007  . BREAST BIOPSY, HX OF 06/23/2007   benign  . Bursitis   . Cataract    bilateral lens implants  . Chronic back pain   . Chronic constipation    "i have had it all my life and the last time i had a colonoscopy they tol me i have a distened colon because of it"   . COLONIC POLYPS, HX OF 06/27/2008  . COMMON MIGRAINE 06/23/2007   occasional  . DEPRESSION 06/23/2007  . Detached retina   . GERD 06/23/2007  . HYPERLIPIDEMIA 06/23/2007  . Hypertension   . Impaired glucose tolerance 02/16/2011  . INSOMNIA-SLEEP DISORDER-UNSPEC 06/26/2009  . Irritable bowel syndrome 06/23/2007   diet controlled  . Microhematuria 07/31/2015  . OSTEOARTHRITIS, LUMBAR SPINE 06/23/2007  . OSTEOPENIA 07/11/2008  . Pre-diabetes    Past Surgical History:  Procedure Laterality Date  . APPENDECTOMY    . CATARACT EXTRACTION     BIL  . colonoscopy  02/2019  . COLONOSCOPY  09/2013, 07/2008, 08/2003, 07/1999   multiple  . COLONOSCOPY WITH PROPOFOL N/A 03/29/2019   Procedure: COLONOSCOPY WITH PROPOFOL;  Surgeon: Rush Landmark Telford Nab., MD;  Location: [facility];  Service: Gastroenterology;  Laterality: N/A;  . ENDOSCOPIC MUCOSAL RESECTION N/A 03/29/2019   Procedure: ENDOSCOPIC MUCOSAL RESECTION;  Surgeon: Rush Landmark Telford Nab., MD;  Location: [facility];  Service: Gastroenterology;  Laterality: N/A;  . HEMOSTASIS CLIP PLACEMENT  03/29/2019   Procedure: HEMOSTASIS CLIP PLACEMENT;  Surgeon: Irving Copas., MD;  Location: [facility];  Service: Gastroenterology;;  . REPLACEMENT TOTAL KNEE Right 04/2020  . RETINAL DETACHMENT SURGERY Right 01/2019  .  RETINAL TEAR REPAIR CRYOTHERAPY Right   . SUBMUCOSAL LIFTING INJECTION  03/29/2019   Procedure: SUBMUCOSAL LIFTING INJECTION;  Surgeon: Rush Landmark Telford Nab., MD;  Location: [facility];  Service: Gastroenterology;;  . TOTAL ABDOMINAL HYSTERECTOMY    . UPPER GASTROINTESTINAL ENDOSCOPY  10/2003  . WISDOM TOOTH EXTRACTION     Current Facility-Administered Medications  Medication Dose Route Frequency Provider Last Rate Last Admin  . 0.9 %  sodium chloride infusion   Intravenous Continuous Mansouraty, Telford Nab., MD      . lactated ringers infusion   Intravenous Continuous Mansouraty, Telford Nab., MD       Allergies  Allergen Reactions  . Codeine Nausea And Vomiting    Violent vomiting   Family History  Problem Relation Age of Onset  . Heart disease Father        MI in early 36's  . Hypertension Father   . Brain cancer Mother   . Brain cancer Maternal Grandmother   . Colon cancer Neg Hx   . Esophageal cancer Neg Hx   . Rectal cancer Neg Hx   . Stomach cancer Neg Hx    Social History   Socioeconomic History  . Marital status: Married    Spouse name: Not on file  . Number of children: 2  . Years of education: Not on file  . Highest education level: Not on file  Occupational History  . Not on file  Tobacco Use  . Smoking status: Former Smoker    Packs/day:  1.00    Years: 35.00    Pack years: 35.00    Types: Cigarettes    Quit date: 04/16/2009    Years since quitting: 11.4  . Smokeless tobacco: Never Used  . Tobacco comment: Quit smoking 04/15/09  Vaping Use  . Vaping Use: Never used  Substance and Sexual Activity  . Alcohol use: Not Currently    Comment: occasional  . Drug use: No  . Sexual activity: Yes    Birth control/protection: Surgical    Comment: Hysterectomy  Other Topics Concern  . Not on file  Social History Narrative  . Not on file   Social Determinants of Health   Financial Resource Strain: Not on file  Food Insecurity: Not on file   Transportation Needs: Not on file  Physical Activity: Not on file  Stress: Not on file  Social Connections: Not on file  Intimate Partner Violence: Not on file    Physical Exam: Vital signs in last 24 hours: Temp:  [98.1 F (36.7 C)] 98.1 F (36.7 C) (01/13 0736) Pulse Rate:  [78] 78 (01/13 0736) Resp:  [16] 16 (01/13 0736) BP: (174)/(78) 174/78 (01/13 0736) SpO2:  [96 %] 96 % (01/13 0736) Weight:  [81.6 kg] 81.6 kg (01/13 0736)   GEN: NAD EYE: Sclerae anicteric ENT: MMM CV: Non-tachycardic GI: Soft, NT/ND NEURO:  Alert & Oriented x 3  Lab Results: No results for input(s): WBC, HGB, HCT, PLT in the last 72 hours. BMET No results for input(s): NA, K, CL, CO2, GLUCOSE, BUN, CREATININE, CALCIUM in the last 72 hours. LFT No results for input(s): PROT, ALBUMIN, AST, ALT, ALKPHOS, BILITOT, BILIDIR, IBILI in the last 72 hours. PT/INR No results for input(s): LABPROT, INR in the last 72 hours.   Impression / Plan: This is a 70 y.o.female who presents for Colonoscopy for follow up of Cecal Polyp s/p EMR in 2020 (first follow up since).  The risks and benefits of endoscopic evaluation were discussed with the patient; these include but are not limited to the risk of perforation, infection, bleeding, missed lesions, lack of diagnosis, severe illness requiring hospitalization, as well as anesthesia and sedation related illnesses.  The patient is agreeable to proceed.    Justice Britain, MD [facility] Office # [phone]

## 2020-09-19 NOTE — Transfer of Care (Signed)
Immediate Anesthesia Transfer of Care Note  Patient: Molly Lewis  Procedure(s) Performed: COLONOSCOPY WITH PROPOFOL (N/A ) POLYPECTOMY BIOPSY  Patient Location: PACU  Anesthesia Type:MAC  Level of Consciousness: awake, alert , oriented and patient cooperative  Airway & Oxygen Therapy: Patient Spontanous Breathing and Patient connected to face mask oxygen  Post-op Assessment: Report given to RN and Post -op Vital signs reviewed and stable  Post vital signs: Reviewed and stable  Last Vitals:  Vitals Value Taken Time  BP 133/94 09/19/20 0956  Temp    Pulse 112 09/19/20 1000  Resp 17 09/19/20 1000  SpO2 96 % 09/19/20 1000  Vitals shown include unvalidated device data.  Last Pain:  Vitals:   09/19/20 0736  TempSrc: Oral  PainSc: 0-No pain         Complications: No complications documented.

## 2020-09-20 LAB — SURGICAL PATHOLOGY

## 2020-09-21 ENCOUNTER — Encounter: Payer: Self-pay | Admitting: Gastroenterology

## 2020-09-23 ENCOUNTER — Encounter (HOSPITAL_COMMUNITY): Payer: Self-pay | Admitting: Gastroenterology

## 2020-09-24 ENCOUNTER — Other Ambulatory Visit: Payer: Self-pay | Admitting: Internal Medicine

## 2020-09-24 NOTE — Telephone Encounter (Signed)
Please refill as per office routine med refill policy (all routine meds refilled for 3 mo or monthly per pt preference up to one year from last visit, then month to month grace period for 3 mo, then further med refills will have to be denied)  

## 2020-10-03 ENCOUNTER — Telehealth: Payer: Self-pay | Admitting: Internal Medicine

## 2020-10-03 DIAGNOSIS — R7302 Impaired glucose tolerance (oral): Secondary | ICD-10-CM

## 2020-10-03 DIAGNOSIS — E7849 Other hyperlipidemia: Secondary | ICD-10-CM

## 2020-10-03 NOTE — Telephone Encounter (Signed)
Pt has 6 month appt 2/11 and labs scheduled for 2/7, need lab orders please. 

## 2020-10-03 NOTE — Telephone Encounter (Signed)
Orders done

## 2020-10-03 NOTE — Telephone Encounter (Signed)
Pt has 6 month appt 2/11 and labs scheduled for 2/7, need lab orders please.

## 2020-10-15 ENCOUNTER — Other Ambulatory Visit: Payer: Medicare Other

## 2020-10-15 ENCOUNTER — Other Ambulatory Visit: Payer: Self-pay

## 2020-10-15 ENCOUNTER — Other Ambulatory Visit (INDEPENDENT_AMBULATORY_CARE_PROVIDER_SITE_OTHER): Payer: Medicare Other

## 2020-10-15 DIAGNOSIS — R7302 Impaired glucose tolerance (oral): Secondary | ICD-10-CM

## 2020-10-15 DIAGNOSIS — E7849 Other hyperlipidemia: Secondary | ICD-10-CM

## 2020-10-15 LAB — CBC WITH DIFFERENTIAL/PLATELET
Basophils Absolute: 0 10*3/uL (ref 0.0–0.1)
Basophils Relative: 0.8 % (ref 0.0–3.0)
Eosinophils Absolute: 0.1 10*3/uL (ref 0.0–0.7)
Eosinophils Relative: 1.5 % (ref 0.0–5.0)
HCT: 40.1 % (ref 36.0–46.0)
Hemoglobin: 13.9 g/dL (ref 12.0–15.0)
Lymphocytes Relative: 43.1 % (ref 12.0–46.0)
Lymphs Abs: 1.8 10*3/uL (ref 0.7–4.0)
MCHC: 34.6 g/dL (ref 30.0–36.0)
MCV: 86.1 fl (ref 78.0–100.0)
Monocytes Absolute: 0.3 10*3/uL (ref 0.1–1.0)
Monocytes Relative: 7.5 % (ref 3.0–12.0)
Neutro Abs: 2 10*3/uL (ref 1.4–7.7)
Neutrophils Relative %: 47.1 % (ref 43.0–77.0)
Platelets: 148 10*3/uL — ABNORMAL LOW (ref 150.0–400.0)
RBC: 4.66 Mil/uL (ref 3.87–5.11)
RDW: 14 % (ref 11.5–15.5)
WBC: 4.2 10*3/uL (ref 4.0–10.5)

## 2020-10-15 LAB — URINALYSIS, ROUTINE W REFLEX MICROSCOPIC
Bilirubin Urine: NEGATIVE
Ketones, ur: NEGATIVE
Leukocytes,Ua: NEGATIVE
Nitrite: NEGATIVE
Specific Gravity, Urine: 1.02 (ref 1.000–1.030)
Total Protein, Urine: NEGATIVE
Urine Glucose: NEGATIVE
Urobilinogen, UA: 1 (ref 0.0–1.0)
pH: 6.5 (ref 5.0–8.0)

## 2020-10-15 LAB — BASIC METABOLIC PANEL
BUN: 15 mg/dL (ref 6–23)
CO2: 32 mEq/L (ref 19–32)
Calcium: 9.8 mg/dL (ref 8.4–10.5)
Chloride: 103 mEq/L (ref 96–112)
Creatinine, Ser: 0.83 mg/dL (ref 0.40–1.20)
GFR: 72.78 mL/min (ref >60.00)
Glucose, Bld: 102 mg/dL — ABNORMAL HIGH (ref 70–99)
Potassium: 3.9 mEq/L (ref 3.5–5.1)
Sodium: 139 mEq/L (ref 135–145)

## 2020-10-15 LAB — LIPID PANEL
Cholesterol: 153 mg/dL (ref 0–200)
HDL: 42 mg/dL (ref >39.00)
NonHDL: 110.96
Total CHOL/HDL Ratio: 4
Triglycerides: 250 mg/dL — ABNORMAL HIGH (ref 0.0–149.0)
VLDL: 50 mg/dL — ABNORMAL HIGH (ref 0.0–40.0)

## 2020-10-15 LAB — HEPATIC FUNCTION PANEL
ALT: 29 U/L (ref 0–35)
AST: 23 U/L (ref 0–37)
Albumin: 4.2 g/dL (ref 3.5–5.2)
Alkaline Phosphatase: 83 U/L (ref 39–117)
Bilirubin, Direct: 0.1 mg/dL (ref 0.0–0.3)
Total Bilirubin: 0.6 mg/dL (ref 0.2–1.2)
Total Protein: 7 g/dL (ref 6.0–8.3)

## 2020-10-15 LAB — TSH: TSH: 1.29 u[IU]/mL (ref 0.35–4.50)

## 2020-10-15 LAB — LDL CHOLESTEROL, DIRECT: Direct LDL: 90 mg/dL

## 2020-10-15 LAB — HEMOGLOBIN A1C: Hgb A1c MFr Bld: 5.8 % (ref 4.6–6.5)

## 2020-10-18 ENCOUNTER — Ambulatory Visit: Payer: Medicare Other | Admitting: Internal Medicine

## 2020-10-22 ENCOUNTER — Encounter: Payer: Self-pay | Admitting: Internal Medicine

## 2020-10-22 ENCOUNTER — Ambulatory Visit (INDEPENDENT_AMBULATORY_CARE_PROVIDER_SITE_OTHER): Payer: Medicare Other | Admitting: Internal Medicine

## 2020-10-22 ENCOUNTER — Other Ambulatory Visit: Payer: Self-pay

## 2020-10-22 VITALS — BP 136/80 | HR 77 | Temp 98.3°F | Ht 65.0 in | Wt 182.0 lb

## 2020-10-22 DIAGNOSIS — E785 Hyperlipidemia, unspecified: Secondary | ICD-10-CM | POA: Diagnosis not present

## 2020-10-22 DIAGNOSIS — R072 Precordial pain: Secondary | ICD-10-CM

## 2020-10-22 DIAGNOSIS — F411 Generalized anxiety disorder: Secondary | ICD-10-CM

## 2020-10-22 DIAGNOSIS — I10 Essential (primary) hypertension: Secondary | ICD-10-CM | POA: Diagnosis not present

## 2020-10-22 DIAGNOSIS — R7302 Impaired glucose tolerance (oral): Secondary | ICD-10-CM

## 2020-10-22 NOTE — Patient Instructions (Signed)
We have discussed the Cardiac CT Score test to measure the calcification level (if any) in your heart arteries.  This test has been ordered in our [place], so please call [facility] directly, as they prefer this, at [phone] to be scheduled.  Please continue all other medications as before, including the aspirin 81 mg per day enteric coated.  Please have the pharmacy call with any other refills you may need.  Please continue your efforts at being more active, low cholesterol diet, and weight control.  You are otherwise up to date with prevention measures today.  Please keep your appointments with your specialists as you may have planned  Please make an Appointment to return for your 1 year visit, or sooner if needed

## 2020-10-22 NOTE — Progress Notes (Signed)
Patient ID: Molly Lewis, female   DOB: 1951/07/02, 70 y.o.   MRN: [id number]         Chief Complaint::        HPI:  Molly Lewis is a 70 y.o. 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  Still requires xanax qhs prn insomnia since none of the otc seem to help  Anxiety increased overall in the past 2 yrs with covid.  Gets sob with wearing the mask sometimes, work is non stop.  Drinks tea for caffeine every days.  Conts to gain several lbs.  No daytime somnolence or apnea per husband  Trying to work on diet.  Wt Readings from Last 3 Encounters:  10/22/20 182 lb (82.6 kg)  09/19/20 179 lb 14.3 oz (81.6 kg)  09/10/20 175 lb (79.4 kg)   BP Readings from Last 3 Encounters:  10/22/20 136/80  09/19/20 (!) 146/59  04/17/20 (!) 150/82   Immunization History  Administered Date(s) Administered  . Influenza Split 06/13/2012  . Influenza Whole 06/27/2008, 06/26/2009, 09/10/2010  . Influenza,inj,Quad PF,6+ Mos 07/19/2013, 07/27/2014, 07/31/2015, 09/11/2016, 06/17/2017  . Influenza,inj,quad, With Preservative 06/08/2019  . Influenza-Unspecified 06/14/2018  . Moderna Sars-Covid-2 Vaccination 11/24/2019, 12/22/2019, 07/20/2020  . Pneumococcal Conjugate-13 06/14/2018  . Pneumococcal Polysaccharide-23 10/13/2017  . Pneumococcal-Unspecified 06/08/2019  . Td 06/26/2009  . Tdap 10/19/2019  . Tetanus 09/08/2019  There are no preventive care reminders to display for this patient.    Past Medical History:  Diagnosis Date  . Allergic rhinitis, cause unspecified 07/18/2011  . Allergy   . ANXIETY 06/23/2007  . BREAST BIOPSY, HX OF 06/23/2007   benign  . Bursitis   . Cataract    bilateral lens implants  . Chronic back pain   . Chronic constipation    "i have  had it all my life and the last time i had a colonoscopy they tol me i have a distened colon because of it"   . COLONIC POLYPS, HX OF 06/27/2008  . COMMON MIGRAINE 06/23/2007   occasional  . DEPRESSION 06/23/2007  . Detached retina   . GERD 06/23/2007  . HYPERLIPIDEMIA 06/23/2007  . Hypertension   . Impaired glucose tolerance 02/16/2011  . INSOMNIA-SLEEP DISORDER-UNSPEC 06/26/2009  . Irritable bowel syndrome 06/23/2007   diet controlled  . Microhematuria 07/31/2015  . OSTEOARTHRITIS, LUMBAR SPINE 06/23/2007  . OSTEOPENIA 07/11/2008  . Pre-diabetes    Past Surgical History:  Procedure Laterality Date  . APPENDECTOMY    . BIOPSY  09/19/2020   Procedure: BIOPSY;  Surgeon: Rush Landmark Telford Nab., MD;  Location: [facility];  Service: Gastroenterology;;  . CATARACT EXTRACTION     BIL  . colonoscopy  02/2019  . COLONOSCOPY  09/2013, 07/2008, 08/2003, 07/1999   multiple  . COLONOSCOPY WITH PROPOFOL N/A 03/29/2019   Procedure: COLONOSCOPY WITH PROPOFOL;  Surgeon: Rush Landmark Telford Nab., MD;  Location: [facility];  Service: Gastroenterology;  Laterality: N/A;  . COLONOSCOPY WITH PROPOFOL N/A 09/19/2020   Procedure: COLONOSCOPY WITH PROPOFOL;  Surgeon: Rush Landmark Telford Nab., MD;  Location: [facility];  Service: Gastroenterology;  Laterality: N/A;  . ENDOSCOPIC MUCOSAL RESECTION N/A 03/29/2019   Procedure: ENDOSCOPIC MUCOSAL RESECTION;  Surgeon: Rush Landmark Telford Nab., MD;  Location: [facility];  Service: Gastroenterology;  Laterality: N/A;  . HEMOSTASIS CLIP PLACEMENT  03/29/2019   Procedure: HEMOSTASIS CLIP PLACEMENT;  Surgeon: Irving Copas., MD;  Location: [facility];  Service: Gastroenterology;;  . POLYPECTOMY  09/19/2020   Procedure: POLYPECTOMY;  Surgeon: Irving Copas., MD;  Location: [facility];  Service: Gastroenterology;;  . REPLACEMENT TOTAL KNEE Right 04/2020  . RETINAL DETACHMENT SURGERY Right 01/2019  . RETINAL TEAR REPAIR CRYOTHERAPY Right   .  SUBMUCOSAL LIFTING INJECTION  03/29/2019   Procedure: SUBMUCOSAL LIFTING INJECTION;  Surgeon: Rush Landmark Telford Nab., MD;  Location: [facility];  Service: Gastroenterology;;  . TOTAL ABDOMINAL HYSTERECTOMY    . UPPER GASTROINTESTINAL ENDOSCOPY  10/2003  . WISDOM TOOTH EXTRACTION      reports that she quit smoking about 11 years ago. Her smoking use included cigarettes. She has a 35.00 pack-year smoking history. She has never used smokeless tobacco. She reports previous alcohol use. She reports that she does not use drugs. family history includes Brain cancer in her maternal grandmother and mother; Heart disease in her father; Hypertension in her father. Allergies  Allergen Reactions  . Codeine Nausea And Vomiting    Violent vomiting   Current Outpatient Medications on File Prior to Visit  Medication Sig Dispense Refill  . ALPRAZolam (XANAX) 1 MG tablet TAKE 1 TABLET(1 MG) BY MOUTH TWICE DAILY AS NEEDED (Patient taking differently: Take 1 mg by mouth 2 (two) times daily as needed (anxiety).) 60 tablet 4  . atorvastatin (LIPITOR) 80 MG tablet TAKE 1 TABLET BY MOUTH DAILY 90 tablet 2  . Bacillus Coagulans-Inulin (PROBIOTIC-PREBIOTIC PO) Take 1 capsule by mouth daily.    . Calcium-Magnesium-Vitamin D (CALCIUM 1200+D3 PO) Take 1 tablet by mouth daily.    . celecoxib (CELEBREX) 200 MG capsule TAKE 1 CAPSULE(200 MG) BY MOUTH TWICE DAILY AS NEEDED FOR MILD PAIN OR MODERATE PAIN (Patient taking differently: Take 200 mg by mouth 2 (two) times daily as needed (pain.).) 180 capsule 1  . losartan (COZAAR) 100 MG tablet Take 1 tablet (100 mg total) by mouth daily. 90 tablet 3  . metroNIDAZOLE (METROGEL) 0.75 % gel Apply 1 application topically 2 (two) times daily.   2  . Multiple Vitamin (MULTIVITAMIN WITH MINERALS) TABS tablet Take 1 tablet by mouth daily.    Marland Kitchen omeprazole (PRILOSEC) 20 MG capsule Take 1 capsule by mouth two times daily (Patient taking differently: Take 20 mg by mouth daily before  breakfast.) 180 capsule 3  . Sodium Sulfate-Mag Sulfate-KCl (SUTAB PO) Take 1 kit by mouth. For colonoscopy prep (Patient not taking: Reported on 10/22/2020)     No current facility-administered medications on file prior to visit.        ROS:  All others reviewed and negative.  Objective        PE:  BP 136/80   Pulse 77   Temp 98.3 F (36.8 C) (Oral)   Ht '5\' 5"'  (1.651 m)   Wt 182 lb (82.6 kg)   LMP  (LMP Unknown)   SpO2 96%   BMI 30.29 kg/m                 Constitutional: Pt appears in NAD               HENT: Head: NCAT.                Right Ear: External ear normal.                 Left Ear: External ear normal.  Eyes: . Pupils are equal, round, and reactive to light. Conjunctivae and EOM are normal               Nose: without d/c or deformity               Neck: Neck supple. Gross normal ROM               Cardiovascular: Normal rate and regular rhythm.                 Pulmonary/Chest: Effort normal and breath sounds without rales or wheezing.                Abd:  Soft, NT, ND, + BS, no organomegaly               Neurological: Pt is alert. At baseline orientation, motor grossly intact               Skin: Skin is warm. No rashes, no other new lesions, LE edema - none               Psychiatric: Pt behavior is normal without agitation   Micro: none  Cardiac tracings I have personally interpreted today:  none  Pertinent Radiological findings (summarize): none   Lab Results  Component Value Date   WBC 4.2 10/15/2020   HGB 13.9 10/15/2020   HCT 40.1 10/15/2020   PLT 148.0 (L) 10/15/2020   GLUCOSE 102 (H) 10/15/2020   CHOL 153 10/15/2020   TRIG 250.0 (H) 10/15/2020   HDL 42.00 10/15/2020   LDLDIRECT 90.0 10/15/2020   LDLCALC 73 10/19/2019   ALT 29 10/15/2020   AST 23 10/15/2020   NA 139 10/15/2020   K 3.9 10/15/2020   CL 103 10/15/2020   CREATININE 0.83 10/15/2020   BUN 15 10/15/2020   CO2 32 10/15/2020   TSH 1.29 10/15/2020   HGBA1C 5.8 10/15/2020    Assessment/Plan:  Molly Lewis is a 70 y.o. White or Caucasian [1] female with  has a past medical history of Allergic rhinitis, cause unspecified (07/18/2011), Allergy, ANXIETY (06/23/2007), BREAST BIOPSY, HX OF (06/23/2007), Bursitis, Cataract, Chronic back pain, Chronic constipation, COLONIC POLYPS, HX OF (06/27/2008), COMMON MIGRAINE (06/23/2007), DEPRESSION (06/23/2007), Detached retina, GERD (06/23/2007), HYPERLIPIDEMIA (06/23/2007), Hypertension, Impaired glucose tolerance (02/16/2011), INSOMNIA-SLEEP DISORDER-UNSPEC (06/26/2009), Irritable bowel syndrome (06/23/2007), Microhematuria (07/31/2015), OSTEOARTHRITIS, LUMBAR SPINE (06/23/2007), OSTEOPENIA (07/11/2008), and Pre-diabetes.  Anxiety state Stable, cont xanax prn   Hyperlipidemia Lab Results  Component Value Date   LDLCALC 73 10/19/2019   Stable, pt to continue current statin liptior 80, for ct cardiac score   Current Outpatient Medications (Cardiovascular):  .  atorvastatin (LIPITOR) 80 MG tablet, TAKE 1 TABLET BY MOUTH DAILY .  losartan (COZAAR) 100 MG tablet, Take 1 tablet (100 mg total) by mouth daily.   Current Outpatient Medications (Analgesics):  .  celecoxib (CELEBREX) 200 MG capsule, TAKE 1 CAPSULE(200 MG) BY MOUTH TWICE DAILY AS NEEDED FOR MILD PAIN OR MODERATE PAIN (Patient taking differently: Take 200 mg by mouth 2 (two) times daily as needed (pain.).)   Current Outpatient Medications (Other):  Marland Kitchen  ALPRAZolam (XANAX) 1 MG tablet, TAKE 1 TABLET(1 MG) BY MOUTH TWICE DAILY AS NEEDED (Patient taking differently: Take 1 mg by mouth 2 (two) times daily as needed (anxiety).) .  Bacillus Coagulans-Inulin (PROBIOTIC-PREBIOTIC PO), Take 1 capsule by mouth daily. .  Calcium-Magnesium-Vitamin D (CALCIUM 1200+D3 PO), Take 1 tablet by mouth daily. .  metroNIDAZOLE (METROGEL) 0.75 % gel, Apply 1  application topically 2 (two) times daily.  .  Multiple Vitamin (MULTIVITAMIN WITH MINERALS) TABS tablet, Take 1 tablet by mouth  daily. Marland Kitchen  omeprazole (PRILOSEC) 20 MG capsule, Take 1 capsule by mouth two times daily (Patient taking differently: Take 20 mg by mouth daily before breakfast.) .  Sodium Sulfate-Mag Sulfate-KCl (SUTAB PO), Take 1 kit by mouth. For colonoscopy prep (Patient not taking: Reported on 10/22/2020)   Hypertension BP Readings from Last 3 Encounters:  10/22/20 136/80  09/19/20 (!) 146/59  04/17/20 (!) 150/82   Stable, pt to continue medical treatment  - losartan   Impaired glucose tolerance Lab Results  Component Value Date   HGBA1C 5.8 10/15/2020   Stable, pt to continue current medical treatment  - diet   Followup: Return in about 1 year (around 10/22/2021).  Cathlean Cower, MD 10/28/2020 10:24 PM [facility]

## 2020-10-28 ENCOUNTER — Encounter: Payer: Self-pay | Admitting: Internal Medicine

## 2020-10-28 NOTE — Assessment & Plan Note (Addendum)
Lab Results  Component Value Date   LDLCALC 73 10/19/2019   Stable, pt to continue current statin liptior 80, for ct cardiac score   Current Outpatient Medications (Cardiovascular):  .  atorvastatin (LIPITOR) 80 MG tablet, TAKE 1 TABLET BY MOUTH DAILY .  losartan (COZAAR) 100 MG tablet, Take 1 tablet (100 mg total) by mouth daily.   Current Outpatient Medications (Analgesics):  .  celecoxib (CELEBREX) 200 MG capsule, TAKE 1 CAPSULE(200 MG) BY MOUTH TWICE DAILY AS NEEDED FOR MILD PAIN OR MODERATE PAIN (Patient taking differently: Take 200 mg by mouth 2 (two) times daily as needed (pain.).)   Current Outpatient Medications (Other):  Marland Kitchen  ALPRAZolam (XANAX) 1 MG tablet, TAKE 1 TABLET(1 MG) BY MOUTH TWICE DAILY AS NEEDED (Patient taking differently: Take 1 mg by mouth 2 (two) times daily as needed (anxiety).) .  Bacillus Coagulans-Inulin (PROBIOTIC-PREBIOTIC PO), Take 1 capsule by mouth daily. .  Calcium-Magnesium-Vitamin D (CALCIUM 1200+D3 PO), Take 1 tablet by mouth daily. .  metroNIDAZOLE (METROGEL) 0.75 % gel, Apply 1 application topically 2 (two) times daily.  .  Multiple Vitamin (MULTIVITAMIN WITH MINERALS) TABS tablet, Take 1 tablet by mouth daily. Marland Kitchen  omeprazole (PRILOSEC) 20 MG capsule, Take 1 capsule by mouth two times daily (Patient taking differently: Take 20 mg by mouth daily before breakfast.) .  Sodium Sulfate-Mag Sulfate-KCl (SUTAB PO), Take 1 kit by mouth. For colonoscopy prep (Patient not taking: Reported on 10/22/2020)

## 2020-10-28 NOTE — Assessment & Plan Note (Signed)
Stable, cont xanax prn 

## 2020-10-28 NOTE — Assessment & Plan Note (Signed)
BP Readings from Last 3 Encounters:  10/22/20 136/80  09/19/20 (!) 146/59  04/17/20 (!) 150/82   Stable, pt to continue medical treatment  - losartan

## 2020-10-28 NOTE — Assessment & Plan Note (Signed)
Lab Results  Component Value Date   HGBA1C 5.8 10/15/2020   Stable, pt to continue current medical treatment  - diet

## 2020-11-13 ENCOUNTER — Encounter: Payer: Self-pay | Admitting: Internal Medicine

## 2020-11-24 ENCOUNTER — Other Ambulatory Visit: Payer: Self-pay | Admitting: Internal Medicine

## 2020-11-24 NOTE — Telephone Encounter (Signed)
Please refill as per office routine med refill policy (all routine meds refilled for 3 mo or monthly per pt preference up to one year from last visit, then month to month grace period for 3 mo, then further med refills will have to be denied)  

## 2020-11-28 DIAGNOSIS — Z96651 Presence of right artificial knee joint: Secondary | ICD-10-CM | POA: Diagnosis not present

## 2020-12-10 ENCOUNTER — Ambulatory Visit (INDEPENDENT_AMBULATORY_CARE_PROVIDER_SITE_OTHER)
Admission: RE | Admit: 2020-12-10 | Discharge: 2020-12-10 | Disposition: A | Discharge status: Home / Self Care | Payer: Self-pay | Source: Ambulatory Visit | Attending: Internal Medicine | Admitting: Internal Medicine

## 2020-12-10 ENCOUNTER — Encounter: Payer: Self-pay | Admitting: Internal Medicine

## 2020-12-10 ENCOUNTER — Other Ambulatory Visit: Payer: Self-pay | Admitting: Internal Medicine

## 2020-12-10 ENCOUNTER — Other Ambulatory Visit: Payer: Self-pay

## 2020-12-10 DIAGNOSIS — E785 Hyperlipidemia, unspecified: Secondary | ICD-10-CM

## 2020-12-10 DIAGNOSIS — I10 Essential (primary) hypertension: Secondary | ICD-10-CM

## 2020-12-10 DIAGNOSIS — R931 Abnormal findings on diagnostic imaging of heart and coronary circulation: Secondary | ICD-10-CM

## 2020-12-10 DIAGNOSIS — F411 Generalized anxiety disorder: Secondary | ICD-10-CM

## 2020-12-10 DIAGNOSIS — R7302 Impaired glucose tolerance (oral): Secondary | ICD-10-CM

## 2020-12-27 ENCOUNTER — Other Ambulatory Visit: Payer: Self-pay | Admitting: Internal Medicine

## 2021-01-08 ENCOUNTER — Encounter (INDEPENDENT_AMBULATORY_CARE_PROVIDER_SITE_OTHER): Payer: Self-pay | Admitting: Ophthalmology

## 2021-01-08 ENCOUNTER — Ambulatory Visit (INDEPENDENT_AMBULATORY_CARE_PROVIDER_SITE_OTHER): Payer: Medicare Other | Admitting: Ophthalmology

## 2021-01-08 ENCOUNTER — Other Ambulatory Visit: Payer: Self-pay

## 2021-01-08 DIAGNOSIS — Z961 Presence of intraocular lens: Secondary | ICD-10-CM

## 2021-01-08 DIAGNOSIS — H43811 Vitreous degeneration, right eye: Secondary | ICD-10-CM

## 2021-01-08 DIAGNOSIS — H33021 Retinal detachment with multiple breaks, right eye: Secondary | ICD-10-CM

## 2021-01-08 NOTE — Assessment & Plan Note (Signed)
History of retinal tear detachment right eye, December 2020, retina attached.  No new findings

## 2021-01-08 NOTE — Assessment & Plan Note (Signed)
OU stable, follow-up with Dr. Alain Honey, OD as scheduled

## 2021-01-08 NOTE — Progress Notes (Signed)
01/08/2021     CHIEF COMPLAINT Patient presents for Retina Follow Up (54yr fu OU/ FP//Pt states," I think my va seems to be about the same but I do have occasional issues with blurriness and spots.")   HISTORY OF PRESENT ILLNESS: Molly Lewis is a 70 y.o. female who presents to the clinic today for:   HPI    Retina Follow Up    Patient presents with  Other.  In both eyes.  This started 1 year ago.  Duration of 1 year.  Since onset it is stable. Additional comments: 46yr fu OU/ FP  Pt states," I think my va seems to be about the same but I do have occasional issues with blurriness and spots."       Last edited by Kendra Opitz, COA on 01/08/2021  8:30 AM. (History)      Referring physician: Biagio Borg, MD [place],  [place] [id number]  HISTORICAL INFORMATION:   Selected notes from the MEDICAL RECORD NUMBER    Lab Results  Component Value Date   HGBA1C 5.8 10/15/2020     CURRENT MEDICATIONS: No current outpatient medications on file. (Ophthalmic Drugs)   No current facility-administered medications for this visit. (Ophthalmic Drugs)   Current Outpatient Medications (Other)  Medication Sig  . ALPRAZolam (XANAX) 1 MG tablet TAKE 1 TABLET(1 MG) BY MOUTH TWICE DAILY AS NEEDED  . atorvastatin (LIPITOR) 80 MG tablet TAKE 1 TABLET BY MOUTH DAILY  . Bacillus Coagulans-Inulin (PROBIOTIC-PREBIOTIC PO) Take 1 capsule by mouth daily.  . Calcium-Magnesium-Vitamin D (CALCIUM 1200+D3 PO) Take 1 tablet by mouth daily.  . celecoxib (CELEBREX) 200 MG capsule TAKE 1 CAPSULE(200 MG) BY MOUTH TWICE DAILY AS NEEDED FOR MILD PAIN OR MODERATE PAIN (Patient taking differently: Take 200 mg by mouth 2 (two) times daily as needed (pain.).)  . losartan (COZAAR) 100 MG tablet Take 1 tablet (100 mg total) by mouth daily.  . metroNIDAZOLE (METROGEL) 0.75 % gel Apply 1 application topically 2 (two) times daily.   . Multiple Vitamin (MULTIVITAMIN WITH MINERALS) TABS tablet Take 1 tablet  by mouth daily.  Marland Kitchen omeprazole (PRILOSEC) 20 MG capsule TAKE 1 CAPSULE BY MOUTH TWICE DAILY  . Sodium Sulfate-Mag Sulfate-KCl (SUTAB PO) Take 1 kit by mouth. For colonoscopy prep (Patient not taking: Reported on 10/22/2020)   No current facility-administered medications for this visit. (Other)      REVIEW OF SYSTEMS:    ALLERGIES Allergies  Allergen Reactions  . Codeine Nausea And Vomiting    Violent vomiting    PAST MEDICAL HISTORY Past Medical History:  Diagnosis Date  . Allergic rhinitis, cause unspecified 07/18/2011  . Allergy   . ANXIETY 06/23/2007  . BREAST BIOPSY, HX OF 06/23/2007   benign  . Bursitis   . Cataract    bilateral lens implants  . Chronic back pain   . Chronic constipation    "i have had it all my life and the last time i had a colonoscopy they tol me i have a distened colon because of it"   . COLONIC POLYPS, HX OF 06/27/2008  . COMMON MIGRAINE 06/23/2007   occasional  . DEPRESSION 06/23/2007  . Detached retina   . GERD 06/23/2007  . HYPERLIPIDEMIA 06/23/2007  . Hypertension   . Impaired glucose tolerance 02/16/2011  . INSOMNIA-SLEEP DISORDER-UNSPEC 06/26/2009  . Irritable bowel syndrome 06/23/2007   diet controlled  . Microhematuria 07/31/2015  . OSTEOARTHRITIS, LUMBAR SPINE 06/23/2007  . OSTEOPENIA 07/11/2008  .  Pre-diabetes    Past Surgical History:  Procedure Laterality Date  . APPENDECTOMY    . BIOPSY  09/19/2020   Procedure: BIOPSY;  Surgeon: Rush Landmark Telford Nab., MD;  Location: [facility];  Service: Gastroenterology;;  . CATARACT EXTRACTION     BIL  . colonoscopy  02/2019  . COLONOSCOPY  09/2013, 07/2008, 08/2003, 07/1999   multiple  . COLONOSCOPY WITH PROPOFOL N/A 03/29/2019   Procedure: COLONOSCOPY WITH PROPOFOL;  Surgeon: Rush Landmark Telford Nab., MD;  Location: [facility];  Service: Gastroenterology;  Laterality: N/A;  . COLONOSCOPY WITH PROPOFOL N/A 09/19/2020   Procedure: COLONOSCOPY WITH PROPOFOL;  Surgeon: Rush Landmark  Telford Nab., MD;  Location: [facility];  Service: Gastroenterology;  Laterality: N/A;  . ENDOSCOPIC MUCOSAL RESECTION N/A 03/29/2019   Procedure: ENDOSCOPIC MUCOSAL RESECTION;  Surgeon: Rush Landmark Telford Nab., MD;  Location: [facility];  Service: Gastroenterology;  Laterality: N/A;  . HEMOSTASIS CLIP PLACEMENT  03/29/2019   Procedure: HEMOSTASIS CLIP PLACEMENT;  Surgeon: Irving Copas., MD;  Location: [facility];  Service: Gastroenterology;;  . POLYPECTOMY  09/19/2020   Procedure: POLYPECTOMY;  Surgeon: Irving Copas., MD;  Location: [facility];  Service: Gastroenterology;;  . REPLACEMENT TOTAL KNEE Right 04/2020  . RETINAL DETACHMENT SURGERY Right 01/2019  . RETINAL TEAR REPAIR CRYOTHERAPY Right   . SUBMUCOSAL LIFTING INJECTION  03/29/2019   Procedure: SUBMUCOSAL LIFTING INJECTION;  Surgeon: Rush Landmark Telford Nab., MD;  Location: [facility];  Service: Gastroenterology;;  . TOTAL ABDOMINAL HYSTERECTOMY    . UPPER GASTROINTESTINAL ENDOSCOPY  10/2003  . WISDOM TOOTH EXTRACTION      FAMILY HISTORY Family History  Problem Relation Age of Onset  . Heart disease Father        MI in early 8's  . Hypertension Father   . Brain cancer Mother   . Brain cancer Maternal Grandmother   . Colon cancer Neg Hx   . Esophageal cancer Neg Hx   . Rectal cancer Neg Hx   . Stomach cancer Neg Hx     SOCIAL HISTORY Social History   Tobacco Use  . Smoking status: Former Smoker    Packs/day: 1.00    Years: 35.00    Pack years: 35.00    Types: Cigarettes    Quit date: 04/16/2009    Years since quitting: 11.7  . Smokeless tobacco: Never Used  . Tobacco comment: Quit smoking 04/15/09  Vaping Use  . Vaping Use: Never used  Substance Use Topics  . Alcohol use: Not Currently    Comment: occasional  . Drug use: No         OPHTHALMIC EXAM:  Base Eye Exam    Visual Acuity (ETDRS)      Right Left   Dist cc 20/25 -2 20/50   Dist ph cc  20/30   Correction: Glasses        Tonometry (Tonopen, 8:34 AM)      Right Left   Pressure 15 16       Pupils      Pupils Dark Light Shape React APD   Right PERRL 4 4 Round Minimal None   Left PERRL 4 3 Round Brisk None       Visual Fields (Counting fingers)      Left Right    Full Full       Extraocular Movement      Right Left    Full Full       Neuro/Psych    Oriented x3: Yes   Mood/Affect: Normal  Dilation    Both eyes: 1.0% Mydriacyl, 2.5% Phenylephrine @ 8:34 AM        Slit Lamp and Fundus Exam    External Exam      Right Left   External Normal Normal       Slit Lamp Exam      Right Left   Lids/Lashes Normal Normal   Conjunctiva/Sclera White and quiet White and quiet   Cornea Clear Clear   Anterior Chamber Deep and quiet Deep and quiet   Iris Round and reactive Round and reactive   Lens Posterior chamber intraocular lens Posterior chamber intraocular lens   Anterior Vitreous Normal Normal       Fundus Exam      Right Left   Posterior Vitreous Clear, vitrectomized Normal   Disc Normal Normal   C/D Ratio 0.1 0.1   Macula attached, normal Normal   Vessels Normal Normal   Periphery Good peripheral laser retinopexy.,  Attached  360 Normal          IMAGING AND PROCEDURES  Imaging and Procedures for 01/08/21  Color Fundus Photography Optos - OU - Both Eyes       Right Eye Progression has improved. Macula : flat, drusen. Vessels : normal observations. Periphery : normal observations.   Left Eye Progression has no prior data. Disc findings include normal observations. Macula : drusen, flat. Vessels : normal observations. Periphery : normal observations.   Notes [id number] of retinal detachment December 2020, reattached nicely.  OD  OS, clear, no holes breaks or tears                ASSESSMENT/PLAN:  Retinal detachment of right eye with multiple breaks History of retinal tear detachment right eye, December 2020, retina attached.  No new  findings  Pseudophakia OU stable, follow-up with Dr. Alain Honey, OD as scheduled      ICD-10-CM   1. Posterior vitreous detachment of right eye  H43.811 Color Fundus Photography Optos - OU - Both Eyes  2. Retinal detachment of right eye with multiple breaks  H33.021   3. Pseudophakia  Z96.1     1.  No new findings OU, will will observe henceforth and have patient follow-up extensively with [facility], Dr. Alain Honey. 2.  No new findings patient to return follow-up promptly if new symptoms were to arise.  3.  Ophthalmic Meds Ordered this visit:  No orders of the defined types were placed in this encounter.      Return if symptoms worsen or fail to improve, for Follow-up Dr. Alain Honey is scheduled.  There are no Patient Instructions on file for this visit.   Explained the diagnoses, plan, and follow up with the patient and they expressed understanding.  Patient expressed understanding of the importance of proper follow up care.   Clent Demark Mccartney Brucks M.D. Diseases & Surgery of the Retina and Vitreous [facility]ina [facility] Di[facility] [place] 01/08/21     Abbreviations: M myopia (nearsighted); A astigmatism; H hyperopia (farsighted); P presbyopia; Mrx spectacle prescription;  CTL contact lenses; OD right eye; OS left eye; OU both eyes  XT exotropia; ET esotropia; PEK punctate epithelial keratitis; PEE punctate epithelial erosions; DES dry eye syndrome; MGD meibomian gland dysfunction; ATs artificial tears; PFAT's preservative free artificial tears; [place] nuclear sclerotic cataract; PSC posterior subcapsular cataract; ERM epi-retinal membrane; PVD posterior vitreous detachment; RD retinal detachment; DM diabetes mellitus; DR diabetic retinopathy; NPDR non-proliferative diabetic retinopathy; PDR proliferative diabetic retinopathy; CSME clinically significant  macular edema; DME diabetic macular edema; dbh dot blot hemorrhages; CWS cotton wool spot; POAG primary open angle glaucoma;  C/D cup-to-disc ratio; HVF humphrey visual field; GVF goldmann visual field; OCT optical coherence tomography; IOP intraocular pressure; BRVO Branch retinal vein occlusion; CRVO central retinal vein occlusion; CRAO central retinal artery occlusion; BRAO branch retinal artery occlusion; RT retinal tear; SB scleral buckle; PPV pars plana vitrectomy; VH Vitreous hemorrhage; PRP panretinal laser photocoagulation; IVK intravitreal kenalog; VMT vitreomacular traction; MH Macular hole;  NVD neovascularization of the disc; NVE neovascularization elsewhere; AREDS age related eye disease study; ARMD age related macular degeneration; POAG primary open angle glaucoma; EBMD epithelial/anterior basement membrane dystrophy; ACIOL anterior chamber intraocular lens; IOL intraocular lens; PCIOL posterior chamber intraocular lens; Phaco/IOL phacoemulsification with intraocular lens placement; [place] photorefractive keratectomy; LASIK laser assisted in situ keratomileusis; HTN hypertension; DM diabetes mellitus; COPD chronic obstructive pulmonary disease

## 2021-01-21 DIAGNOSIS — M5416 Radiculopathy, lumbar region: Secondary | ICD-10-CM | POA: Diagnosis not present

## 2021-01-21 DIAGNOSIS — M47816 Spondylosis without myelopathy or radiculopathy, lumbar region: Secondary | ICD-10-CM | POA: Diagnosis not present

## 2021-01-23 DIAGNOSIS — D1801 Hemangioma of skin and subcutaneous tissue: Secondary | ICD-10-CM | POA: Diagnosis not present

## 2021-01-23 DIAGNOSIS — D224 Melanocytic nevi of scalp and neck: Secondary | ICD-10-CM | POA: Diagnosis not present

## 2021-01-23 DIAGNOSIS — L821 Other seborrheic keratosis: Secondary | ICD-10-CM | POA: Diagnosis not present

## 2021-01-23 DIAGNOSIS — L720 Epidermal cyst: Secondary | ICD-10-CM | POA: Diagnosis not present

## 2021-01-23 DIAGNOSIS — L820 Inflamed seborrheic keratosis: Secondary | ICD-10-CM | POA: Diagnosis not present

## 2021-01-23 DIAGNOSIS — L814 Other melanin hyperpigmentation: Secondary | ICD-10-CM | POA: Diagnosis not present

## 2021-02-03 NOTE — Progress Notes (Signed)
Cardiology Office Note:    Date:  02/04/2021   ID:  Molly, Lewis 1951-07-23, MRN [id number]  PCP:  Biagio Borg, MD   [facility] Providers Cardiologist:  None     CC: SOB Consulted for the evaluation of CT findings at the behest of Biagio Borg, MD  History of Present Illness:    Molly Lewis is a 70 y.o. female with a hx of HTN, HLD, CAC, and Aortic Atherosclerosis who presents for evaluation 02/04/21.  Patient notes that she is feeling new SOB and tires out too easily.  Has had some chest tightness-> after a long day bra feels like it is squeezing her.  Discomfort occurs it late afternoon, worsens at her job.  Sometimes the tightness is work with walking..  Patient exertion notable for a part time job as a Office manager andand occasional feels symptoms on her feet.  No shortness of breath but notes DOE.  No PND or orthopnea.  No bendopnea, notes slight weight gain, notes post work leg swelling (hx of right sided knee replacement in August 2021), but no abdominal swelling.  No syncope or near syncope . Notes  no palpitations or funny heart beats.  Notes arm tingling with certain positions.   No history of pre-eclampsia. Previously has been on prevention ASA but had red stool that stopped with cessation.  Ambulatory BP: 160s but rarely checked   Past Medical History:  Diagnosis Date  . Allergic rhinitis, cause unspecified 07/18/2011  . Allergy   . ANXIETY 06/23/2007  . BREAST BIOPSY, HX OF 06/23/2007   benign  . Bursitis   . Cataract    bilateral lens implants  . Chronic back pain   . Chronic constipation    "i have had it all my life and the last time i had a colonoscopy they tol me i have a distened colon because of it"   . COLONIC POLYPS, HX OF 06/27/2008  . COMMON MIGRAINE 06/23/2007   occasional  . DEPRESSION 06/23/2007  . Detached retina   . GERD 06/23/2007  . HYPERLIPIDEMIA 06/23/2007  . Hypertension   . Impaired glucose tolerance 02/16/2011  .  INSOMNIA-SLEEP DISORDER-UNSPEC 06/26/2009  . Irritable bowel syndrome 06/23/2007   diet controlled  . Microhematuria 07/31/2015  . OSTEOARTHRITIS, LUMBAR SPINE 06/23/2007  . OSTEOPENIA 07/11/2008  . Pre-diabetes     Past Surgical History:  Procedure Laterality Date  . APPENDECTOMY    . BIOPSY  09/19/2020   Procedure: BIOPSY;  Surgeon: Rush Landmark Telford Nab., MD;  Location: [place];  Service: Gastroenterology;;  . CATARACT EXTRACTION     BIL  . colonoscopy  02/2019  . COLONOSCOPY  09/2013, 07/2008, 08/2003, 07/1999   multiple  . COLONOSCOPY WITH PROPOFOL N/A 03/29/2019   Procedure: COLONOSCOPY WITH PROPOFOL;  Surgeon: Rush Landmark Telford Nab., MD;  Location: [facility];  Service: Gastroenterology;  Laterality: N/A;  . COLONOSCOPY WITH PROPOFOL N/A 09/19/2020   Procedure: COLONOSCOPY WITH PROPOFOL;  Surgeon: Rush Landmark Telford Nab., MD;  Location: [place];  Service: Gastroenterology;  Laterality: N/A;  . ENDOSCOPIC MUCOSAL RESECTION N/A 03/29/2019   Procedure: ENDOSCOPIC MUCOSAL RESECTION;  Surgeon: Rush Landmark Telford Nab., MD;  Location: [facility];  Service: Gastroenterology;  Laterality: N/A;  . HEMOSTASIS CLIP PLACEMENT  03/29/2019   Procedure: HEMOSTASIS CLIP PLACEMENT;  Surgeon: Irving Copas., MD;  Location: [facility];  Service: Gastroenterology;;  . POLYPECTOMY  09/19/2020   Procedure: POLYPECTOMY;  Surgeon: Irving Copas., MD;  Location: [place];  Service: Gastroenterology;;  . REPLACEMENT TOTAL KNEE Right 04/2020  . RETINAL DETACHMENT SURGERY Right 01/2019  . RETINAL TEAR REPAIR CRYOTHERAPY Right   . SUBMUCOSAL LIFTING INJECTION  03/29/2019   Procedure: SUBMUCOSAL LIFTING INJECTION;  Surgeon: Rush Landmark Telford Nab., MD;  Location: [facility];  Service: Gastroenterology;;  . TOTAL ABDOMINAL HYSTERECTOMY    . UPPER GASTROINTESTINAL ENDOSCOPY  10/2003  . WISDOM TOOTH EXTRACTION      Current Medications: Current Meds  Medication Sig  .  ALPRAZolam (XANAX) 1 MG tablet TAKE 1 TABLET(1 MG) BY MOUTH TWICE DAILY AS NEEDED  . atorvastatin (LIPITOR) 80 MG tablet TAKE 1 TABLET BY MOUTH DAILY  . Bacillus Coagulans-Inulin (PROBIOTIC-PREBIOTIC PO) Take 1 capsule by mouth daily.  . Calcium-Magnesium-Vitamin D (CALCIUM 1200+D3 PO) Take 1 tablet by mouth daily.  . celecoxib (CELEBREX) 200 MG capsule TAKE 1 CAPSULE(200 MG) BY MOUTH TWICE DAILY AS NEEDED FOR MILD PAIN OR MODERATE PAIN  . ezetimibe (ZETIA) 10 MG tablet Take 1 tablet (10 mg total) by mouth daily.  Marland Kitchen losartan (COZAAR) 100 MG tablet Take 1 tablet (100 mg total) by mouth daily.  . metroNIDAZOLE (METROGEL) 0.75 % gel Apply 1 application topically 2 (two) times daily.   . Multiple Vitamin (MULTIVITAMIN WITH MINERALS) TABS tablet Take 1 tablet by mouth daily.  Marland Kitchen omeprazole (PRILOSEC) 20 MG capsule TAKE 1 CAPSULE BY MOUTH TWICE DAILY     Allergies:   Codeine   Social History   Socioeconomic History  . Marital status: Married    Spouse name: Not on file  . Number of children: 2  . Years of education: Not on file  . Highest education level: Not on file  Occupational History  . Not on file  Tobacco Use  . Smoking status: Former Smoker    Packs/day: 1.00    Years: 35.00    Pack years: 35.00    Types: Cigarettes    Quit date: 04/16/2009    Years since quitting: 11.8  . Smokeless tobacco: Never Used  . Tobacco comment: Quit smoking 04/15/09  Vaping Use  . Vaping Use: Never used  Substance and Sexual Activity  . Alcohol use: Not Currently    Comment: occasional  . Drug use: No  . Sexual activity: Yes    Birth control/protection: Surgical    Comment: Hysterectomy  Other Topics Concern  . Not on file  Social History Narrative  . Not on file   Social Determinants of Health   Financial Resource Strain: Not on file  Food Insecurity: Not on file  Transportation Needs: Not on file  Physical Activity: Not on file  Stress: Not on file  Social Connections: Not on file      Family History: The patient's family history includes Brain cancer in her maternal grandmother and mother; Heart disease in her father; Hypertension in her father. There is no history of Colon cancer, Esophageal cancer, Rectal cancer, or Stomach cancer. History of coronary artery disease notable for father needing CABG. History of heart failure notable for no members. History of arrhythmia notable for no members Brother has FH..  ROS:   Please see the history of present illness.    Notes occasional hand swelling.  All other systems reviewed and are negative.  EKGs/Labs/Other Studies Reviewed:    The following studies were reviewed today:  EKG:  EKG is  ordered today.  The ekg ordered today demonstrates  02/04/21: Sr rate 63 borderline ant inf pattern  CAC: Date: 12/10/20 Results: AC 246 1.  Scattered tiny subpleural pulmonary nodules bilaterally. All of these nodules have the appearance of likely postinflammatory nodules. No follow-up needed if patient is low-risk (and has no known or suspected primary neoplasm). Non-contrast chest CT can be considered in 12 months if patient is high-risk. This recommendation follows the consensus statement: Guidelines for Management of Incidental Pulmonary Nodules Detected on CT Images: From the Fleischner Society 2017; Radiology 2017; 284:228-243. 2. Calcified plaque in the thoracic aorta. 3. Trace pericardial fluid.  Aortic Atherosclerosis   Recent Labs: 10/15/2020: ALT 29; BUN 15; Creatinine, Ser 0.83; Hemoglobin 13.9; Platelets 148.0; Potassium 3.9; Sodium 139; TSH 1.29  Recent Lipid Panel    Component Value Date/Time   CHOL 153 10/15/2020 0942   TRIG 250.0 (H) 10/15/2020 0942   HDL 42.00 10/15/2020 0942   CHOLHDL 4 10/15/2020 0942   VLDL 50.0 (H) 10/15/2020 0942   LDLCALC 73 10/19/2019 0954   LDLDIRECT 90.0 10/15/2020 0942    Risk Assessment/Calculations:     N/A  Physical Exam:    VS:  BP 140/80   Pulse 63   Ht 5\' 5"   (1.651 m)   Wt 81.2 kg   LMP  (LMP Unknown)   SpO2 99%   BMI 29.79 kg/m     Wt Readings from Last 3 Encounters:  02/04/21 81.2 kg  10/22/20 82.6 kg  09/19/20 81.6 kg     GEN:  Well nourished, well developed in no acute distress HEENT: Normal NECK: No JVD LYMPHATICS: No lymphadenopathy CARDIAC: RRR, no murmurs, rubs, gallops RESPIRATORY:  Clear to auscultation without rales, wheezing or rhonchi  ABDOMEN: Soft, non-tender, non-distended MUSCULOSKELETAL: trace bilateral non-pitting edema; No deformity  SKIN: Warm and dry NEUROLOGIC:  Alert and oriented x 3 PSYCHIATRIC:  Normal affect   ASSESSMENT:    1. DOE (dyspnea on exertion)   2. Coronary artery calcification   3. Aortic atherosclerosis ([place])   4. Primary hypertension   5. Hyperlipidemia, unspecified hyperlipidemia type    PLAN:    In order of problems listed above:  CAC and Aortic Atherosclerosis HTN HLD Pulmonary Nodules- will need CT non-con in one year; can [facility] to [facility] MD or [facility]od[facility] DOE Anxiety - LDL < 70; offering zetia 10 mg PO Daily with lipids and lfts in three months - will start ambulatory BP check - will get CBC and BNP - continue losartan 100 mg PO Daily - continue atorvastatin 80 mg PO Daily - will get exercise NM Stress Test; can switch to LexiScan needed - reviewed CT scan with patient   Shared Decision Making/Informed Consent The risks [chest pain, shortness of breath, cardiac arrhythmias, dizziness, blood pressure fluctuations, myocardial infarction, stroke/transient ischemic attack, nausea, vomiting, allergic reaction, radiation exposure, metallic taste sensation and life-threatening complications (estimated to be 1 in 10,000)], benefits (risk stratification, diagnosing coronary artery disease, treatment guidance) and alternatives of a nuclear stress test were discussed in detail with Ms. Joens and she agrees to proceed.   Three months follow up unless new symptoms or abnormal  test results warranting change in plan  Would be reasonable for  APP Follow up    Medication Adjustments/Labs and Tests Ordered: Current medicines are reviewed at length with the patient today.  Concerns regarding medicines are outlined above.  Orders Placed This Encounter  Procedures  . Pro b natriuretic peptide (BNP)  . CBC  . Cardiac Stress Test: Informed Consent Details: Physician/Practitioner Attestation; Transcribe to consent form and obtain patient signature  . MYOCARDIAL PERFUSION IMAGING  .  EKG 12-Lead   Meds ordered this encounter  Medications  . ezetimibe (ZETIA) 10 MG tablet    Sig: Take 1 tablet (10 mg total) by mouth daily.    Dispense:  90 tablet    Refill:  3    Patient Instructions  Medication Instructions:  Your physician has recommended you make the following change in your medication:  START: zetia 10 mg by mouth daily   *If you need a refill on your cardiac medications before your next appointment, please call your pharmacy*   Lab Work: TODAY: BNP and CBC If you have labs (blood work) drawn today and your tests are completely normal, you will receive your results only by: Marland Kitchen MyChart Message (if you have MyChart) OR . A paper copy in the mail If you have any lab test that is abnormal or we need to change your treatment, we will call you to review the results.   Testing/Procedures: Your physician has requested that you have en exercise stress myoview. For further information please visit [email or web address]. Please follow instruction sheet, as given.    Follow-Up: At [facility], you and your health needs are our priority.  As part of our continuing mission to provide you with exceptional heart care, we have created designated Provider Care Teams.  These Care Teams include your primary Cardiologist (physician) and Advanced Practice Providers (APPs -  Physician Assistants and Nurse Practitioners) who all work together to provide you with the care you  need, when you need it.  We recommend signing up for the patient portal called "MyChart".  Sign up information is provided on this After Visit Summary.  MyChart is used to connect with patients for Virtual Visits (Telemedicine).  Patients are able to view lab/test results, encounter notes, upcoming appointments, etc.  Non-urgent messages can be sent to your provider as well.   To learn more about what you can do with MyChart, go to [email or web address].    Your next appointment:   3 month(s)  The format for your next appointment:   In Person  Provider:   You may see Rudean Haskell, MD or one of the following Advanced Practice Providers on your designated Care Team:    Melina Copa, PA-C  Ermalinda Barrios, PA-C    Other Instructions  Check ambulatory BP 2-3 times per week.  If readings vary please check daily.     You are scheduled for a Myocardial Perfusion Imaging Study. Please arrive 15 minutes prior to your appointment time for registration and insurance purposes.   The test will take approximately 3 to 4 hours to complete; you may bring reading material.  If someone comes with you to your appointment, they will need to remain in the main lobby due to limited space in the testing area. **If you are pregnant or breastfeeding, please notify the nuclear lab prior to your appointment**   How to prepare for your Myocardial Perfusion Test:  Do not eat or drink 3 hours prior to your test, except you may have water.  Do not consume products containing caffeine (regular or decaffeinated) 12 hours prior to your test. (ex: coffee, chocolate, sodas, tea).  Do bring a list of your current medications with you.  If not listed below, you may take your medications as normal.  Do wear comfortable clothes (no dresses or overalls) and walking shoes, tennis shoes preferred (No heels or open toe shoes are allowed).  Do NOT wear cologne, perfume, aftershave, or lotions (deodorant is  allowed).  If these instructions are not followed, your test will have to be rescheduled.  If you cannot keep your appointment, please provide 24 hours notification to the Nuclear Lab, to avoid a possible $50 charge to your account.        Signed, Werner Lean, MD  02/04/2021 1:03 PM    [facility]

## 2021-02-04 ENCOUNTER — Other Ambulatory Visit: Payer: Self-pay

## 2021-02-04 ENCOUNTER — Encounter: Payer: Self-pay | Admitting: Internal Medicine

## 2021-02-04 ENCOUNTER — Ambulatory Visit (INDEPENDENT_AMBULATORY_CARE_PROVIDER_SITE_OTHER): Payer: Medicare Other | Admitting: Internal Medicine

## 2021-02-04 VITALS — BP 140/80 | HR 63 | Ht 65.0 in | Wt 179.0 lb

## 2021-02-04 DIAGNOSIS — R0600 Dyspnea, unspecified: Secondary | ICD-10-CM

## 2021-02-04 DIAGNOSIS — I700 Atherosclerosis of aorta: Secondary | ICD-10-CM | POA: Diagnosis not present

## 2021-02-04 DIAGNOSIS — E785 Hyperlipidemia, unspecified: Secondary | ICD-10-CM | POA: Diagnosis not present

## 2021-02-04 DIAGNOSIS — I10 Essential (primary) hypertension: Secondary | ICD-10-CM

## 2021-02-04 DIAGNOSIS — I2584 Coronary atherosclerosis due to calcified coronary lesion: Secondary | ICD-10-CM

## 2021-02-04 DIAGNOSIS — I2510 Atherosclerotic heart disease of native coronary artery without angina pectoris: Secondary | ICD-10-CM | POA: Diagnosis not present

## 2021-02-04 DIAGNOSIS — R0609 Other forms of dyspnea: Secondary | ICD-10-CM

## 2021-02-04 LAB — CBC
Hematocrit: 40.4 % (ref 34.0–46.6)
Hemoglobin: 13.9 g/dL (ref 11.1–15.9)
MCH: 29.4 pg (ref 26.6–33.0)
MCHC: 34.4 g/dL (ref 31.5–35.7)
MCV: 85 fL (ref 79–97)
Platelets: 176 10*3/uL (ref 150–450)
RBC: 4.73 x10E6/uL (ref 3.77–5.28)
RDW: 12.8 % (ref 11.7–15.4)
WBC: 5.1 10*3/uL (ref 3.4–10.8)

## 2021-02-04 LAB — PRO B NATRIURETIC PEPTIDE: NT-Pro BNP: 54 pg/mL (ref 0–301)

## 2021-02-04 MED ORDER — EZETIMIBE 10 MG PO TABS: 10.0000 mg | ORAL_TABLET | Freq: Every day | ORAL | 3 refills | Status: DC

## 2021-02-04 NOTE — Patient Instructions (Addendum)
Medication Instructions:  Your physician has recommended you make the following change in your medication:  START: zetia 10 mg by mouth daily   *If you need a refill on your cardiac medications before your next appointment, please call your pharmacy*   Lab Work: TODAY: BNP and CBC If you have labs (blood work) drawn today and your tests are completely normal, you will receive your results only by: Marland Kitchen MyChart Message (if you have MyChart) OR . A paper copy in the mail If you have any lab test that is abnormal or we need to change your treatment, we will call you to review the results.   Testing/Procedures: Your physician has requested that you have en exercise stress myoview. For further information please visit [email or web address]. Please follow instruction sheet, as given.    Follow-Up: At [facility], you and your health needs are our priority.  As part of our continuing mission to provide you with exceptional heart care, we have created designated Provider Care Teams.  These Care Teams include your primary Cardiologist (physician) and Advanced Practice Providers (APPs -  Physician Assistants and Nurse Practitioners) who all work together to provide you with the care you need, when you need it.  We recommend signing up for the patient portal called "MyChart".  Sign up information is provided on this After Visit Summary.  MyChart is used to connect with patients for Virtual Visits (Telemedicine).  Patients are able to view lab/test results, encounter notes, upcoming appointments, etc.  Non-urgent messages can be sent to your provider as well.   To learn more about what you can do with MyChart, go to [email or web address].    Your next appointment:   3 month(s)  The format for your next appointment:   In Person  Provider:   You may see Rudean Haskell, MD or one of the following Advanced Practice Providers on your designated Care Team:    Melina Copa, PA-C  Ermalinda Barrios, PA-C    Other Instructions  Check ambulatory BP 2-3 times per week.  If readings vary please check daily.     You are scheduled for a Myocardial Perfusion Imaging Study. Please arrive 15 minutes prior to your appointment time for registration and insurance purposes.   The test will take approximately 3 to 4 hours to complete; you may bring reading material.  If someone comes with you to your appointment, they will need to remain in the main lobby due to limited space in the testing area. **If you are pregnant or breastfeeding, please notify the nuclear lab prior to your appointment**   How to prepare for your Myocardial Perfusion Test:  Do not eat or drink 3 hours prior to your test, except you may have water.  Do not consume products containing caffeine (regular or decaffeinated) 12 hours prior to your test. (ex: coffee, chocolate, sodas, tea).  Do bring a list of your current medications with you.  If not listed below, you may take your medications as normal.  Do wear comfortable clothes (no dresses or overalls) and walking shoes, tennis shoes preferred (No heels or open toe shoes are allowed).  Do NOT wear cologne, perfume, aftershave, or lotions (deodorant is allowed).  If these instructions are not followed, your test will have to be rescheduled.  If you cannot keep your appointment, please provide 24 hours notification to the Nuclear Lab, to avoid a possible $50 charge to your account.

## 2021-02-13 DIAGNOSIS — M47816 Spondylosis without myelopathy or radiculopathy, lumbar region: Secondary | ICD-10-CM | POA: Diagnosis not present

## 2021-02-18 ENCOUNTER — Telehealth (HOSPITAL_COMMUNITY): Payer: Self-pay | Admitting: *Deleted

## 2021-02-18 NOTE — Telephone Encounter (Signed)
Left message on voicemail per DPR in reference to upcoming appointment scheduled on 02/24/21 at 10:30 with detailed instructions given per Myocardial Perfusion Study Information Sheet for the test. LM to arrive 15 minutes early, and that it is imperative to arrive on time for appointment to keep from having the test rescheduled. If you need to cancel or reschedule your appointment, please call the office within 24 hours of your appointment. Failure to do so may result in a cancellation of your appointment, and a $50 no show fee. Phone number given for call back for any questions.

## 2021-02-24 ENCOUNTER — Other Ambulatory Visit: Payer: Self-pay

## 2021-02-24 ENCOUNTER — Ambulatory Visit (HOSPITAL_COMMUNITY): Payer: Medicare Other | Attending: Cardiovascular Disease

## 2021-02-24 DIAGNOSIS — R0609 Other forms of dyspnea: Secondary | ICD-10-CM

## 2021-02-24 DIAGNOSIS — R0600 Dyspnea, unspecified: Secondary | ICD-10-CM | POA: Diagnosis not present

## 2021-02-24 LAB — MYOCARDIAL PERFUSION IMAGING
LV dias vol: 56 mL (ref 46–106)
LV sys vol: 20 mL
Peak HR: 101 {beats}/min
Rest HR: 74 {beats}/min
SDS: 0
SRS: 0
SSS: 0
TID: 0.94

## 2021-02-24 MED ORDER — TECHNETIUM TC 99M TETROFOSMIN IV KIT
10.6000 | PACK | Freq: Once | INTRAVENOUS | Status: AC | PRN
  Administered 2021-02-24: 10.6 via INTRAVENOUS
  Filled 2021-02-24: qty 11

## 2021-02-24 MED ORDER — TECHNETIUM TC 99M TETROFOSMIN IV KIT
29.9000 | PACK | Freq: Once | INTRAVENOUS | Status: AC | PRN
  Administered 2021-02-24: 29.9 via INTRAVENOUS
  Filled 2021-02-24: qty 30

## 2021-02-24 MED ORDER — REGADENOSON 0.4 MG/5ML IV SOLN
0.4000 mg | Freq: Once | INTRAVENOUS | Status: AC
  Administered 2021-02-24: 0.4 mg via INTRAVENOUS

## 2021-02-27 ENCOUNTER — Ambulatory Visit (INDEPENDENT_AMBULATORY_CARE_PROVIDER_SITE_OTHER): Payer: Medicare Other

## 2021-02-27 ENCOUNTER — Ambulatory Visit (INDEPENDENT_AMBULATORY_CARE_PROVIDER_SITE_OTHER): Payer: Medicare Other | Admitting: Internal Medicine

## 2021-02-27 ENCOUNTER — Encounter: Payer: Self-pay | Admitting: Internal Medicine

## 2021-02-27 ENCOUNTER — Other Ambulatory Visit: Payer: Self-pay

## 2021-02-27 VITALS — BP 180/86 | HR 95 | Temp 98.0°F | Ht 65.0 in | Wt 184.4 lb

## 2021-02-27 DIAGNOSIS — I10 Essential (primary) hypertension: Secondary | ICD-10-CM | POA: Diagnosis not present

## 2021-02-27 DIAGNOSIS — R7302 Impaired glucose tolerance (oral): Secondary | ICD-10-CM | POA: Diagnosis not present

## 2021-02-27 DIAGNOSIS — Z96651 Presence of right artificial knee joint: Secondary | ICD-10-CM | POA: Diagnosis not present

## 2021-02-27 DIAGNOSIS — F411 Generalized anxiety disorder: Secondary | ICD-10-CM | POA: Diagnosis not present

## 2021-02-27 DIAGNOSIS — R0600 Dyspnea, unspecified: Secondary | ICD-10-CM

## 2021-02-27 DIAGNOSIS — R0609 Other forms of dyspnea: Secondary | ICD-10-CM

## 2021-02-27 DIAGNOSIS — I2584 Coronary atherosclerosis due to calcified coronary lesion: Secondary | ICD-10-CM

## 2021-02-27 DIAGNOSIS — I2510 Atherosclerotic heart disease of native coronary artery without angina pectoris: Secondary | ICD-10-CM | POA: Diagnosis not present

## 2021-02-27 MED ORDER — CLONAZEPAM 0.5 MG PO TABS: 0.5000 mg | ORAL_TABLET | Freq: Two times a day (BID) | ORAL | 2 refills | Status: DC | PRN

## 2021-02-27 MED ORDER — CITALOPRAM HYDROBROMIDE 20 MG PO TABS: 20.0000 mg | ORAL_TABLET | Freq: Every day | ORAL | 3 refills | Status: DC

## 2021-02-27 NOTE — Patient Instructions (Signed)
Ok to change the xanax to the clonazepam 0.5 mg twice per day as needed  Please take all new medication as prescribed - the celexa 20 mg per day  Please continue all other medications as before, and refills have been done if requested.  Please have the pharmacy call with any other refills you may need.  Please continue your efforts at being more active, low cholesterol diet, and weight control.  You are otherwise up to date with prevention measures today.  Please keep your appointments with your specialists as you may have planned  You will be contacted regarding the referral for: pulmonary, and lung testing called PFT's (pulmonary function tests)  Please go to the XRAY Department in the first floor for the x-ray testing  You will be contacted by phone if any changes need to be made immediately.  Otherwise, you will receive a letter about your results with an explanation, but please check with MyChart first.  Please remember to sign up for MyChart if you have not done so, as this will be important to you in the future with finding out test results, communicating by private email, and scheduling acute appointments online when needed.  Please make an Appointment to return in 1 month

## 2021-02-27 NOTE — Progress Notes (Signed)
Patient ID: Molly Lewis, female   DOB: [birth year], 70 y.o.   MRN: [id number]        Chief Complaint: follow up anxiety, htn, cad, and ongoing dyspnea       HPI:  Molly Lewis is a 70 y.o. female here with c/o quite upset regarding recent phone call from cardiology stating her stress test was negative, after a prior cardiac CT scroe elevated - just cant understand this and anxiety off the charts, and still has ongoing dyspnea unexplained.  Pt denies chest pain, orthopnea, PND, increased LE swelling, palpitations, dizziness or syncope.   Pt denies polydipsia, polyuria, or new focal neuro s/s.  Denies worsening depressive symptoms, suicidal ideation, or panic; has ongoing anxiety.  BP has been < 140;90 at home.   Pt denies fever, wt loss, night sweats, loss of appetite, or other constitutional symptoms  No other new complaints      Wt Readings from Last 3 Encounters:  02/27/21 184 lb 6.4 oz (83.6 kg)  02/24/21 179 lb (81.2 kg)  02/04/21 179 lb (81.2 kg)   BP Readings from Last 3 Encounters:  02/27/21 (!) 180/86  02/04/21 140/80  10/22/20 136/80         Past Medical History:  Diagnosis Date   Allergic rhinitis, cause unspecified 07/18/2011   Allergy    ANXIETY 06/23/2007   BREAST BIOPSY, HX OF 06/23/2007   benign   Bursitis    Cataract    bilateral lens implants   Chronic back pain    Chronic constipation    "i have had it all my life and the last time i had a colonoscopy they tol me i have a distened colon because of it"    COLONIC POLYPS, HX OF 06/27/2008   COMMON MIGRAINE 06/23/2007   occasional   DEPRESSION 06/23/2007   Detached retina    GERD 06/23/2007   HYPERLIPIDEMIA 06/23/2007   Hypertension    Impaired glucose tolerance 02/16/2011   INSOMNIA-SLEEP DISORDER-UNSPEC 06/26/2009   Irritable bowel syndrome 06/23/2007   diet controlled   Microhematuria 07/31/2015   OSTEOARTHRITIS, LUMBAR SPINE 06/23/2007   OSTEOPENIA 07/11/2008   Pre-diabetes    Past Surgical  History:  Procedure Laterality Date   APPENDECTOMY     BIOPSY  09/19/2020   Procedure: BIOPSY;  Surgeon: Irving Copas., MD;  Location: [place];  Service: Gastroenterology;;   CATARACT EXTRACTION     BIL   colonoscopy  02/2019   COLONOSCOPY  09/2013, 07/2008, 08/2003, 07/1999   multiple   COLONOSCOPY WITH PROPOFOL N/A 03/29/2019   Procedure: COLONOSCOPY WITH PROPOFOL;  Surgeon: Irving Copas., MD;  Location: [facility];  Service: Gastroenterology;  Laterality: N/A;   COLONOSCOPY WITH PROPOFOL N/A 09/19/2020   Procedure: COLONOSCOPY WITH PROPOFOL;  Surgeon: Rush Landmark Telford Nab., MD;  Location: [place];  Service: Gastroenterology;  Laterality: N/A;   ENDOSCOPIC MUCOSAL RESECTION N/A 03/29/2019   Procedure: ENDOSCOPIC MUCOSAL RESECTION;  Surgeon: Rush Landmark Telford Nab., MD;  Location: [facility];  Service: Gastroenterology;  Laterality: N/A;   HEMOSTASIS CLIP PLACEMENT  03/29/2019   Procedure: HEMOSTASIS CLIP PLACEMENT;  Surgeon: Irving Copas., MD;  Location: [facility];  Service: Gastroenterology;;   POLYPECTOMY  09/19/2020   Procedure: POLYPECTOMY;  Surgeon: Irving Copas., MD;  Location: [place];  Service: Gastroenterology;;   REPLACEMENT TOTAL KNEE Right 04/2020   RETINAL DETACHMENT SURGERY Right 01/2019   RETINAL TEAR REPAIR CRYOTHERAPY Right    SUBMUCOSAL LIFTING INJECTION  03/29/2019   Procedure:  SUBMUCOSAL LIFTING INJECTION;  Surgeon: Rush Landmark Telford Nab., MD;  Location: [facility];  Service: Gastroenterology;;   TOTAL ABDOMINAL HYSTERECTOMY     UPPER GASTROINTESTINAL ENDOSCOPY  10/2003   WISDOM TOOTH EXTRACTION      reports that she quit smoking about 11 years ago. Her smoking use included cigarettes. She has a 35.00 pack-year smoking history. She has never used smokeless tobacco. She reports previous alcohol use. She reports that she does not use drugs. family history includes Brain cancer in her maternal grandmother and  mother; Heart disease in her father; Hypertension in her father. Allergies  Allergen Reactions   Codeine Nausea And Vomiting    Violent vomiting   Current Outpatient Medications on File Prior to Visit  Medication Sig Dispense Refill   atorvastatin (LIPITOR) 80 MG tablet TAKE 1 TABLET BY MOUTH DAILY 90 tablet 2   Bacillus Coagulans-Inulin (PROBIOTIC-PREBIOTIC PO) Take 1 capsule by mouth daily.     Calcium-Magnesium-Vitamin D (CALCIUM 1200+D3 PO) Take 1 tablet by mouth daily.     celecoxib (CELEBREX) 200 MG capsule TAKE 1 CAPSULE(200 MG) BY MOUTH TWICE DAILY AS NEEDED FOR MILD PAIN OR MODERATE PAIN 180 capsule 1   ezetimibe (ZETIA) 10 MG tablet Take 1 tablet (10 mg total) by mouth daily. 90 tablet 3   losartan (COZAAR) 100 MG tablet Take 1 tablet (100 mg total) by mouth daily. 90 tablet 3   metroNIDAZOLE (METROGEL) 0.75 % gel Apply 1 application topically 2 (two) times daily.   2   Multiple Vitamin (MULTIVITAMIN WITH MINERALS) TABS tablet Take 1 tablet by mouth daily.     omeprazole (PRILOSEC) 20 MG capsule TAKE 1 CAPSULE BY MOUTH TWICE DAILY 180 capsule 3   No current facility-administered medications on file prior to visit.        ROS:  All others reviewed and negative.  Objective        PE:  BP (!) 180/86 (BP Location: Left Arm, Patient Position: Sitting, Cuff Size: Normal)   Pulse 95   Temp 98 F (36.7 C) (Oral)   Ht 5\' 5"  (1.651 m)   Wt 184 lb 6.4 oz (83.6 kg)   LMP  (LMP Unknown)   SpO2 96%   BMI 30.69 kg/m                 Constitutional: Pt appears in NAD               HENT: Head: NCAT.                Right Ear: External ear normal.                 Left Ear: External ear normal.                Eyes: . Pupils are equal, round, and reactive to light. Conjunctivae and EOM are normal               Nose: without d/c or deformity               Neck: Neck supple. Gross normal ROM               Cardiovascular: Normal rate and regular rhythm.                 Pulmonary/Chest:  Effort normal and breath sounds without rales or wheezing.                Abd:  Soft, NT, ND, +  BS, no organomegaly               Neurological: Pt is alert. At baseline orientation, motor grossly intact               Skin: Skin is warm. No rashes, no other new lesions, LE edema - none               Psychiatric: Pt behavior is normal without agitation   Micro: none  Cardiac tracings I have personally interpreted today:  none  Pertinent Radiological findings (summarize): none   Lab Results  Component Value Date   WBC 5.1 02/04/2021   HGB 13.9 02/04/2021   HCT 40.4 02/04/2021   PLT 176 02/04/2021   GLUCOSE 102 (H) 10/15/2020   CHOL 153 10/15/2020   TRIG 250.0 (H) 10/15/2020   HDL 42.00 10/15/2020   LDLDIRECT 90.0 10/15/2020   LDLCALC 73 10/19/2019   ALT 29 10/15/2020   AST 23 10/15/2020   NA 139 10/15/2020   K 3.9 10/15/2020   CL 103 10/15/2020   CREATININE 0.83 10/15/2020   BUN 15 10/15/2020   CO2 32 10/15/2020   TSH 1.29 10/15/2020   HGBA1C 5.8 10/15/2020   Assessment/Plan:  Molly Lewis is a 70 y.o. White or Caucasian [1] female with  has a past medical history of Allergic rhinitis, cause unspecified (07/18/2011), Allergy, ANXIETY (06/23/2007), BREAST BIOPSY, HX OF (06/23/2007), Bursitis, Cataract, Chronic back pain, Chronic constipation, COLONIC POLYPS, HX OF (06/27/2008), COMMON MIGRAINE (06/23/2007), DEPRESSION (06/23/2007), Detached retina, GERD (06/23/2007), HYPERLIPIDEMIA (06/23/2007), Hypertension, Impaired glucose tolerance (02/16/2011), INSOMNIA-SLEEP DISORDER-UNSPEC (06/26/2009), Irritable bowel syndrome (06/23/2007), Microhematuria (07/31/2015), OSTEOARTHRITIS, LUMBAR SPINE (06/23/2007), OSTEOPENIA (07/11/2008), and Pre-diabetes.  Anxiety state With acute on chronic worsening, for celexa 20 qd, change xanax to klonopin prn,  to f/u any worsening symptoms or concerns  Hypertension BP Readings from Last 3 Encounters:  02/27/21 (!) 180/86  02/04/21 140/80   10/22/20 136/80   Uncontrolled, likely situational, pt to continue medical treatment  - losartan  DOE (dyspnea on exertion) Etiology unclear, exam benign, anxiety may be an element,  for cxr, pft's, refer pulmonary  Impaired glucose tolerance Lab Results  Component Value Date   HGBA1C 5.8 10/15/2020   Stable, pt to continue current medical treatment  - diet   Coronary artery calcification Pt with elevated Coronary calcium score, but stress testing neg, pt educated , reassured, no other change in tx at this time  Followup: No follow-ups on file.  Cathlean Cower, MD 03/01/2021 8:19 PM [facility]

## 2021-03-01 ENCOUNTER — Encounter: Payer: Self-pay | Admitting: Internal Medicine

## 2021-03-01 NOTE — Assessment & Plan Note (Signed)
Etiology unclear, exam benign, anxiety may be an element,  for cxr, pft's, refer pulmonary

## 2021-03-01 NOTE — Assessment & Plan Note (Signed)
Lab Results  Component Value Date   HGBA1C 5.8 10/15/2020   Stable, pt to continue current medical treatment  - diet

## 2021-03-01 NOTE — Assessment & Plan Note (Signed)
BP Readings from Last 3 Encounters:  02/27/21 (!) 180/86  02/04/21 140/80  10/22/20 136/80   Uncontrolled, likely situational, pt to continue medical treatment  - losartan

## 2021-03-01 NOTE — Assessment & Plan Note (Signed)
With acute on chronic worsening, for celexa 20 qd, change xanax to klonopin prn,  to f/u any worsening symptoms or concerns

## 2021-03-01 NOTE — Assessment & Plan Note (Signed)
Pt with elevated Coronary calcium score, but stress testing neg, pt educated , reassured, no other change in tx at this time

## 2021-03-12 ENCOUNTER — Other Ambulatory Visit: Payer: Self-pay | Admitting: Internal Medicine

## 2021-03-12 NOTE — Telephone Encounter (Signed)
Please refill as per office routine med refill policy (all routine meds refilled for 3 mo or monthly per pt preference up to one year from last visit, then month to month grace period for 3 mo, then further med refills will have to be denied)  

## 2021-03-20 ENCOUNTER — Encounter (INDEPENDENT_AMBULATORY_CARE_PROVIDER_SITE_OTHER): Payer: Medicare Other | Admitting: Ophthalmology

## 2021-04-03 ENCOUNTER — Other Ambulatory Visit: Payer: Self-pay

## 2021-04-03 ENCOUNTER — Ambulatory Visit (INDEPENDENT_AMBULATORY_CARE_PROVIDER_SITE_OTHER): Payer: Medicare Other | Admitting: Internal Medicine

## 2021-04-03 VITALS — BP 162/80 | HR 72 | Temp 98.7°F | Ht 65.0 in | Wt 176.0 lb

## 2021-04-03 DIAGNOSIS — I10 Essential (primary) hypertension: Secondary | ICD-10-CM | POA: Diagnosis not present

## 2021-04-03 DIAGNOSIS — I700 Atherosclerosis of aorta: Secondary | ICD-10-CM

## 2021-04-03 DIAGNOSIS — I2510 Atherosclerotic heart disease of native coronary artery without angina pectoris: Secondary | ICD-10-CM

## 2021-04-03 DIAGNOSIS — R0600 Dyspnea, unspecified: Secondary | ICD-10-CM | POA: Diagnosis not present

## 2021-04-03 DIAGNOSIS — J309 Allergic rhinitis, unspecified: Secondary | ICD-10-CM | POA: Diagnosis not present

## 2021-04-03 DIAGNOSIS — I2584 Coronary atherosclerosis due to calcified coronary lesion: Secondary | ICD-10-CM

## 2021-04-03 DIAGNOSIS — R7302 Impaired glucose tolerance (oral): Secondary | ICD-10-CM | POA: Diagnosis not present

## 2021-04-03 DIAGNOSIS — R0609 Other forms of dyspnea: Secondary | ICD-10-CM

## 2021-04-03 MED ORDER — ALBUTEROL SULFATE HFA 108 (90 BASE) MCG/ACT IN AERS: 2.0000 | INHALATION_SPRAY | Freq: Four times a day (QID) | RESPIRATORY_TRACT | 5 refills | Status: DC | PRN

## 2021-04-03 NOTE — Progress Notes (Signed)
Chief Complaint: follow up dyspnea, allergies, anxiety, hyperglycemia, htn, aortic atherosclerosis       HPI:  Molly Lewis is a 70 y.o. female here overall ok but Pt denies chest pain, orthopnea, PND, increased LE swelling, palpitations, dizziness or syncope, but does have ongoing dyspnea/exertional and anxiety, not sure which comes first in relation to the other.  Has lost some wt with better diet and feels less dyspneic overall than last visit.     Pt denies polydipsia, polyuria, or new focal neuro s/s.   Pt denies fever, wt loss, night sweats, loss of appetite, or other constitutional symptoms  Has not yet had PFTs or pulmonary referral visit.  Has not smoked in > 10 yrs.  Does have several wks ongoing nasal allergy symptoms with clearish congestion, itch and sneezing, without fever, pain, ST, cough, swelling or wheezing.   Pt denies fever, wt loss, night sweats, loss of appetite, or other constitutional symptoms .  Pt states BP at home < 140/90, and is somewhat nervous today  Wt Readings from Last 3 Encounters:  04/03/21 176 lb (79.8 kg)  02/27/21 184 lb 6.4 oz (83.6 kg)  02/24/21 179 lb (81.2 kg)   BP Readings from Last 3 Encounters:  04/03/21 (!) 162/80  02/27/21 (!) 180/86  02/04/21 140/80         Past Medical History:  Diagnosis Date   Allergic rhinitis, cause unspecified 07/18/2011   Allergy    ANXIETY 06/23/2007   BREAST BIOPSY, HX OF 06/23/2007   benign   Bursitis    Cataract    bilateral lens implants   Chronic back pain    Chronic constipation    "i have had it all my life and the last time i had a colonoscopy they tol me i have a distened colon because of it"    COLONIC POLYPS, HX OF 06/27/2008   COMMON MIGRAINE 06/23/2007   occasional   DEPRESSION 06/23/2007   Detached retina    GERD 06/23/2007   HYPERLIPIDEMIA 06/23/2007   Hypertension    Impaired glucose tolerance 02/16/2011   INSOMNIA-SLEEP DISORDER-UNSPEC 06/26/2009   Irritable bowel syndrome  06/23/2007   diet controlled   Microhematuria 07/31/2015   OSTEOARTHRITIS, LUMBAR SPINE 06/23/2007   OSTEOPENIA 07/11/2008   Pre-diabetes    Past Surgical History:  Procedure Laterality Date   APPENDECTOMY     BIOPSY  09/19/2020   Procedure: BIOPSY;  Surgeon: Irving Copas., MD;  Location: [place];  Service: Gastroenterology;;   CATARACT EXTRACTION     BIL   colonoscopy  02/2019   COLONOSCOPY  09/2013, 07/2008, 08/2003, 07/1999   multiple   COLONOSCOPY WITH PROPOFOL N/A 03/29/2019   Procedure: COLONOSCOPY WITH PROPOFOL;  Surgeon: Irving Copas., MD;  Location: [facility];  Service: Gastroenterology;  Laterality: N/A;   COLONOSCOPY WITH PROPOFOL N/A 09/19/2020   Procedure: COLONOSCOPY WITH PROPOFOL;  Surgeon: Rush Landmark Telford Nab., MD;  Location: [place];  Service: Gastroenterology;  Laterality: N/A;   ENDOSCOPIC MUCOSAL RESECTION N/A 03/29/2019   Procedure: ENDOSCOPIC MUCOSAL RESECTION;  Surgeon: Rush Landmark Telford Nab., MD;  Location: [facility];  Service: Gastroenterology;  Laterality: N/A;   HEMOSTASIS CLIP PLACEMENT  03/29/2019   Procedure: HEMOSTASIS CLIP PLACEMENT;  Surgeon: Irving Copas., MD;  Location: [facility];  Service: Gastroenterology;;   POLYPECTOMY  09/19/2020   Procedure: POLYPECTOMY;  Surgeon: Irving Copas., MD;  Location: [place];  Service: Gastroenterology;;   REPLACEMENT TOTAL KNEE Right 04/2020  RETINAL DETACHMENT SURGERY Right 01/2019   RETINAL TEAR REPAIR CRYOTHERAPY Right    SUBMUCOSAL LIFTING INJECTION  03/29/2019   Procedure: SUBMUCOSAL LIFTING INJECTION;  Surgeon: Irving Copas., MD;  Location: [facility];  Service: Gastroenterology;;   TOTAL ABDOMINAL HYSTERECTOMY     UPPER GASTROINTESTINAL ENDOSCOPY  10/2003   WISDOM TOOTH EXTRACTION      reports that she quit smoking about 11 years ago. Her smoking use included cigarettes. She has a 35.00 pack-year smoking history. She has never used  smokeless tobacco. She reports previous alcohol use. She reports that she does not use drugs. family history includes Brain cancer in her maternal grandmother and mother; Heart disease in her father; Hypertension in her father. Allergies  Allergen Reactions   Codeine Nausea And Vomiting    Violent vomiting   Current Outpatient Medications on File Prior to Visit  Medication Sig Dispense Refill   atorvastatin (LIPITOR) 80 MG tablet TAKE 1 TABLET BY MOUTH DAILY 90 tablet 2   Bacillus Coagulans-Inulin (PROBIOTIC-PREBIOTIC PO) Take 1 capsule by mouth daily.     Calcium-Magnesium-Vitamin D (CALCIUM 1200+D3 PO) Take 1 tablet by mouth daily.     celecoxib (CELEBREX) 200 MG capsule TAKE 1 CAPSULE(200 MG) BY MOUTH TWICE DAILY AS NEEDED FOR MILD PAIN OR MODERATE PAIN 180 capsule 1   citalopram (CELEXA) 20 MG tablet Take 1 tablet (20 mg total) by mouth daily. 90 tablet 3   clonazePAM (KLONOPIN) 0.5 MG tablet Take 1 tablet (0.5 mg total) by mouth 2 (two) times daily as needed for anxiety. 60 tablet 2   ezetimibe (ZETIA) 10 MG tablet Take 1 tablet (10 mg total) by mouth daily. 90 tablet 3   losartan (COZAAR) 100 MG tablet TAKE 1 TABLET(100 MG) BY MOUTH DAILY 90 tablet 1   metroNIDAZOLE (METROGEL) 0.75 % gel Apply 1 application topically 2 (two) times daily.   2   Multiple Vitamin (MULTIVITAMIN WITH MINERALS) TABS tablet Take 1 tablet by mouth daily.     omeprazole (PRILOSEC) 20 MG capsule TAKE 1 CAPSULE BY MOUTH TWICE DAILY 180 capsule 3   No current facility-administered medications on file prior to visit.        ROS:  All others reviewed and negative.  Objective        PE:  BP (!) 162/80   Pulse 72   Temp 98.7 F (37.1 C) (Oral)   Ht '5\' 5"'$  (1.651 m)   Wt 176 lb (79.8 kg)   LMP  (LMP Unknown)   SpO2 94%   BMI 29.29 kg/m                 Constitutional: Pt appears in NAD               HENT: Head: NCAT.                Right Ear: External ear normal.                 Left Ear: External ear  normal.                Eyes: . Pupils are equal, round, and reactive to light. Conjunctivae and EOM are normal               Nose: without d/c or deformity               Neck: Neck supple. Gross normal ROM  Cardiovascular: Normal rate and regular rhythm.                 Pulmonary/Chest: Effort normal and breath sounds decreased without rales or wheezing.                Abd:  Soft, NT, ND, + BS, no organomegaly               Neurological: Pt is alert. At baseline orientation, motor grossly intact               Skin: Skin is warm. No rashes, no other new lesions, LE edema - none               Psychiatric: Pt behavior is normal without agitation   Micro: none  Cardiac tracings I have personally interpreted today:  none  Pertinent Radiological findings (summarize): February 27, 2021 IMPRESSION: No active cardiopulmonary disease.     Lab Results  Component Value Date   WBC 5.1 02/04/2021   HGB 13.9 02/04/2021   HCT 40.4 02/04/2021   PLT 176 02/04/2021   GLUCOSE 102 (H) 10/15/2020   CHOL 153 10/15/2020   TRIG 250.0 (H) 10/15/2020   HDL 42.00 10/15/2020   LDLDIRECT 90.0 10/15/2020   LDLCALC 73 10/19/2019   ALT 29 10/15/2020   AST 23 10/15/2020   NA 139 10/15/2020   K 3.9 10/15/2020   CL 103 10/15/2020   CREATININE 0.83 10/15/2020   BUN 15 10/15/2020   CO2 32 10/15/2020   TSH 1.29 10/15/2020   HGBA1C 5.8 10/15/2020   Assessment/Plan:  ADALENA ENSMINGER is a 70 y.o. White or Caucasian [1] female with  has a past medical history of Allergic rhinitis, cause unspecified (07/18/2011), Allergy, ANXIETY (06/23/2007), BREAST BIOPSY, HX OF (06/23/2007), Bursitis, Cataract, Chronic back pain, Chronic constipation, COLONIC POLYPS, HX OF (06/27/2008), COMMON MIGRAINE (06/23/2007), DEPRESSION (06/23/2007), Detached retina, GERD (06/23/2007), HYPERLIPIDEMIA (06/23/2007), Hypertension, Impaired glucose tolerance (02/16/2011), INSOMNIA-SLEEP DISORDER-UNSPEC (06/26/2009), Irritable bowel  syndrome (06/23/2007), Microhematuria (07/31/2015), OSTEOARTHRITIS, LUMBAR SPINE (06/23/2007), OSTEOPENIA (07/11/2008), and Pre-diabetes.  Impaired glucose tolerance Lab Results  Component Value Date   HGBA1C 5.8 10/15/2020   Stable, pt to continue current medical treatment  - diet, wt control   Hypertension BP Readings from Last 3 Encounters:  04/03/21 (!) 162/80  02/27/21 (!) 180/86  02/04/21 140/80   Stable, pt to continue medical treatment  - losartan   DOE (dyspnea on exertion) Etiology unclear, I suspect pulm related possible copd though cant r/o anxiety component, obesity; ok for albuterol hfa prn, and f/u PFTs and pulmonary referral  Aortic atherosclerosis (HCC) Pt to continue lipitor, diet, exercise,  to f/u any worsening symptoms or concerns  Allergic rhinitis Uncontrolled, for otc nasacort asd, to f/u any worsening symptoms or concerns  Followup: No follow-ups on file.  Molly Cower, MD 04/05/2021 6:54 PM [facility]

## 2021-04-03 NOTE — Patient Instructions (Signed)
Please take all new medication as prescribed - the albuterol in haler as needed  Ok to try the OTC Nasacort for the allergies as ewll  Ok to try the Clonazepam at 1 mg (2 pills) in the evening to see if this works better as well  Please continue all other medications as before, and refills have been done if requested.  Please have the pharmacy call with any other refills you may need.  Please continue your efforts at being more active, low cholesterol diet, and weight control.  Please keep your appointments with your specialists as you may have planned  Hopefully you will be called about the Lung testing and Pulmonary referral soon   Please make an Appointment to return in 6 months, or sooner if needed

## 2021-04-05 NOTE — Assessment & Plan Note (Signed)
Uncontrolled, for otc nasacort asd, to f/u any worsening symptoms or concerns

## 2021-04-05 NOTE — Assessment & Plan Note (Signed)
Pt to continue lipitor, diet, exercise,  to f/u any worsening symptoms or concerns

## 2021-04-05 NOTE — Assessment & Plan Note (Signed)
Etiology unclear, I suspect pulm related possible copd though cant r/o anxiety component, obesity; ok for albuterol hfa prn, and f/u PFTs and pulmonary referral

## 2021-04-05 NOTE — Assessment & Plan Note (Signed)
BP Readings from Last 3 Encounters:  04/03/21 (!) 162/80  02/27/21 (!) 180/86  02/04/21 140/80   Stable, pt to continue medical treatment  - losartan

## 2021-04-05 NOTE — Assessment & Plan Note (Signed)
Lab Results  Component Value Date   HGBA1C 5.8 10/15/2020   Stable, pt to continue current medical treatment  - diet, wt control

## 2021-04-09 ENCOUNTER — Other Ambulatory Visit: Payer: Self-pay | Admitting: Internal Medicine

## 2021-05-02 DIAGNOSIS — Z96651 Presence of right artificial knee joint: Secondary | ICD-10-CM | POA: Diagnosis not present

## 2021-05-04 NOTE — Progress Notes (Signed)
Cardiology Office Note:    Date:  05/05/2021   ID:  Molly Lewis, Molly Lewis [birth year], MRN [id number]  PCP:  Biagio Borg, MD   [facility] Providers Cardiologist:  Werner Lean, MD     CC: SOB follow up  History of Present Illness:    Molly Lewis is a 70 y.o. female with a hx of HTN, HLD, CAC, and Aortic Atherosclerosis who presents for evaluation 02/04/21. In interim of this visit, patient had negative stress test. Seen 05/05/21  Patient notes that she is doing well.  Since last visit notes that she is eating better and lost 10 lbs. Has felt the best she had done in the past ten years.  No chest pain or pressure .  Only one episode of SOB associated with fatigue and being on her feet all day.  No weight gain or leg swelling.  No palpitations or syncope .  Ambulatory blood pressure 160-170.  Most with anxiety.   Past Medical History:  Diagnosis Date   Allergic rhinitis, cause unspecified 07/18/2011   Allergy    ANXIETY 06/23/2007   BREAST BIOPSY, HX OF 06/23/2007   benign   Bursitis    Cataract    bilateral lens implants   Chronic back pain    Chronic constipation    "i have had it all my life and the last time i had a colonoscopy they tol me i have a distened colon because of it"    COLONIC POLYPS, HX OF 06/27/2008   COMMON MIGRAINE 06/23/2007   occasional   DEPRESSION 06/23/2007   Detached retina    GERD 06/23/2007   HYPERLIPIDEMIA 06/23/2007   Hypertension    Impaired glucose tolerance 02/16/2011   INSOMNIA-SLEEP DISORDER-UNSPEC 06/26/2009   Irritable bowel syndrome 06/23/2007   diet controlled   Microhematuria 07/31/2015   OSTEOARTHRITIS, LUMBAR SPINE 06/23/2007   OSTEOPENIA 07/11/2008   Pre-diabetes     Past Surgical History:  Procedure Laterality Date   APPENDECTOMY     BIOPSY  09/19/2020   Procedure: BIOPSY;  Surgeon: Irving Copas., MD;  Location: [place];  Service: Gastroenterology;;   CATARACT EXTRACTION     BIL    colonoscopy  02/2019   COLONOSCOPY  09/2013, 07/2008, 08/2003, 07/1999   multiple   COLONOSCOPY WITH PROPOFOL N/A 03/29/2019   Procedure: COLONOSCOPY WITH PROPOFOL;  Surgeon: Irving Copas., MD;  Location: [facility];  Service: Gastroenterology;  Laterality: N/A;   COLONOSCOPY WITH PROPOFOL N/A 09/19/2020   Procedure: COLONOSCOPY WITH PROPOFOL;  Surgeon: Rush Landmark Telford Nab., MD;  Location: [place];  Service: Gastroenterology;  Laterality: N/A;   ENDOSCOPIC MUCOSAL RESECTION N/A 03/29/2019   Procedure: ENDOSCOPIC MUCOSAL RESECTION;  Surgeon: Rush Landmark Telford Nab., MD;  Location: [facility];  Service: Gastroenterology;  Laterality: N/A;   HEMOSTASIS CLIP PLACEMENT  03/29/2019   Procedure: HEMOSTASIS CLIP PLACEMENT;  Surgeon: Irving Copas., MD;  Location: [facility];  Service: Gastroenterology;;   POLYPECTOMY  09/19/2020   Procedure: POLYPECTOMY;  Surgeon: Irving Copas., MD;  Location: [place];  Service: Gastroenterology;;   REPLACEMENT TOTAL KNEE Right 04/2020   RETINAL DETACHMENT SURGERY Right 01/2019   RETINAL TEAR REPAIR CRYOTHERAPY Right    SUBMUCOSAL LIFTING INJECTION  03/29/2019   Procedure: SUBMUCOSAL LIFTING INJECTION;  Surgeon: Irving Copas., MD;  Location: [facility];  Service: Gastroenterology;;   TOTAL ABDOMINAL HYSTERECTOMY     UPPER GASTROINTESTINAL ENDOSCOPY  10/2003   WISDOM TOOTH EXTRACTION      Current  Medications: Current Meds  Medication Sig   albuterol (VENTOLIN HFA) 108 (90 Base) MCG/ACT inhaler Inhale 2 puffs into the lungs every 6 (six) hours as needed for wheezing or shortness of breath.   albuterol (VENTOLIN HFA) 108 (90 Base) MCG/ACT inhaler Inhale into the lungs every 6 (six) hours as needed for wheezing or shortness of breath.   aspirin EC 81 MG tablet Take 1 tablet (81 mg total) by mouth daily. Swallow whole.   atorvastatin (LIPITOR) 80 MG tablet TAKE 1 TABLET BY MOUTH DAILY   Bacillus Coagulans-Inulin  (PROBIOTIC-PREBIOTIC PO) Take 1 capsule by mouth daily.   Calcium-Magnesium-Vitamin D (CALCIUM 1200+D3 PO) Take 1 tablet by mouth daily.   celecoxib (CELEBREX) 200 MG capsule TAKE 1 CAPSULE(200 MG) BY MOUTH TWICE DAILY AS NEEDED FOR MILD PAIN OR MODERATE PAIN   citalopram (CELEXA) 20 MG tablet Take 1 tablet (20 mg total) by mouth daily.   clonazePAM (KLONOPIN) 0.5 MG tablet Take 1 tablet (0.5 mg total) by mouth 2 (two) times daily as needed for anxiety.   ezetimibe (ZETIA) 10 MG tablet Take 1 tablet (10 mg total) by mouth daily.   gabapentin (NEURONTIN) 300 MG capsule 300 mg daily as needed.   hydrochlorothiazide (MICROZIDE) 12.5 MG capsule Take 1 capsule (12.5 mg total) by mouth daily.   losartan (COZAAR) 100 MG tablet TAKE 1 TABLET(100 MG) BY MOUTH DAILY   metroNIDAZOLE (METROGEL) 0.75 % gel Apply 1 application topically 2 (two) times daily.    Multiple Vitamin (MULTIVITAMIN WITH MINERALS) TABS tablet Take 1 tablet by mouth daily.   omeprazole (PRILOSEC) 20 MG capsule TAKE 1 CAPSULE BY MOUTH TWICE DAILY     Allergies:   Codeine   Social History   Socioeconomic History   Marital status: Married    Spouse name: Not on file   Number of children: 2   Years of education: Not on file   Highest education level: Not on file  Occupational History   Not on file  Tobacco Use   Smoking status: Former    Packs/day: 1.00    Years: 35.00    Pack years: 35.00    Types: Cigarettes    Quit date: 04/16/2009    Years since quitting: 12.0   Smokeless tobacco: Never   Tobacco comments:    Quit smoking 04/15/09  Vaping Use   Vaping Use: Never used  Substance and Sexual Activity   Alcohol use: Not Currently    Comment: occasional   Drug use: No   Sexual activity: Yes    Birth control/protection: Surgical    Comment: Hysterectomy  Other Topics Concern   Not on file  Social History Narrative   Not on file   Social Determinants of Health   Financial Resource Strain: Not on file  Food  Insecurity: Not on file  Transportation Needs: Not on file  Physical Activity: Not on file  Stress: Not on file  Social Connections: Not on file    Social: back to work after retirement  Family History: The patient's family history includes Brain cancer in her maternal grandmother and mother; Heart disease in her father; Hypertension in her father. There is no history of Colon cancer, Esophageal cancer, Rectal cancer, or Stomach cancer. History of coronary artery disease notable for father needing CABG. History of heart failure notable for no members. History of arrhythmia notable for no members Brother has FH..  ROS:   Please see the history of present illness.    Notes occasional hand swelling.  All other systems reviewed and are negative.  EKGs/Labs/Other Studies Reviewed:    The following studies were reviewed today:  EKG:  EKG is  ordered today.  The ekg ordered today demonstrates  02/04/21: Sr rate 63 borderline ant inf pattern  CAC: Date: 12/10/20 Results: AC 246 1. Scattered tiny subpleural pulmonary nodules bilaterally. All of these nodules have the appearance of likely postinflammatory nodules. No follow-up needed if patient is low-risk (and has no known or suspected primary neoplasm). Non-contrast chest CT can be considered in 12 months if patient is high-risk. This recommendation follows the consensus statement: Guidelines for Management of Incidental Pulmonary Nodules Detected on CT Images: From the Fleischner Society 2017; Radiology 2017; 284:228-243. 2. Calcified plaque in the thoracic aorta. 3. Trace pericardial fluid.   Aortic Atherosclerosis  ECG or NM Stress Testing : Date: 02/24/21 Results: Addendum by Thayer Headings, MD on Mon Feb 24, 2021  3:15 PM Nuclear stress EF: 65%. The left ventricular ejection fraction is normal (55-65%). This is a low risk study. There is no evidence of ischemia and no evidence of previous infarction The study is normal.    Finalized by Thayer Headings, MD on Mon Feb 24, 2021  3:12 PM The left ventricular ejection fraction is normal (55-65%). Nuclear stress EF: 65%.   Recent Labs: 10/15/2020: ALT 29; BUN 15; Creatinine, Ser 0.83; Potassium 3.9; Sodium 139; TSH 1.29 02/04/2021: Hemoglobin 13.9; NT-Pro BNP 54; Platelets 176  Recent Lipid Panel    Component Value Date/Time   CHOL 153 10/15/2020 0942   TRIG 250.0 (H) 10/15/2020 0942   HDL 42.00 10/15/2020 0942   CHOLHDL 4 10/15/2020 0942   VLDL 50.0 (H) 10/15/2020 0942   LDLCALC 73 10/19/2019 0954   LDLDIRECT 90.0 10/15/2020 0942    Risk Assessment/Calculations:     N/A  Physical Exam:    VS:  BP 140/72   Pulse 72   Ht '5\' 5"'$  (1.651 m)   Wt 174 lb (78.9 kg)   LMP  (LMP Unknown)   SpO2 95%   BMI 28.96 kg/m     Wt Readings from Last 3 Encounters:  05/05/21 174 lb (78.9 kg)  04/03/21 176 lb (79.8 kg)  02/27/21 184 lb 6.4 oz (83.6 kg)     GEN:  Well nourished, well developed in no acute distress HEENT: Normal NECK: No JVD LYMPHATICS: No lymphadenopathy CARDIAC: RRR, no murmurs, rubs, gallops RESPIRATORY:  Clear to auscultation without rales, wheezing or rhonchi  ABDOMEN: Soft, non-tender, non-distended MUSCULOSKELETAL: trace bilateral non-pitting edema; No deformity  SKIN: Warm and dry NEUROLOGIC:  Alert and oriented x 3 PSYCHIATRIC:  Normal affect   ASSESSMENT:    1. Aortic atherosclerosis ([place])   2. Coronary artery calcification   3. Hyperlipidemia, unspecified hyperlipidemia type   4. Primary hypertension     PLAN:    In order of problems listed above:  CAC and Aortic Atherosclerosis HTN HLD Pulmonary Nodules- will need CT non-con in one year; can return to [facility] MD or Pulm Nod[facility] Anxiety - LDL < 70; on  zetia 10 mg PO Daily and lipids and LFTs today - amb BP is elevated - continue losartan 100 mg PO Daily - continue atorvastatin 80 mg PO Daily - has red stools with ASA; will re-trial and can stop if new  bleeding - will start HCTZ 12.5 mg PO daily - will check BMP , Lipids, and LFTs post HCTZ start  Six months follow up unless new symptoms or abnormal test  results warranting change in plan   Medication Adjustments/Labs and Tests Ordered: Current medicines are reviewed at length with the patient today.  Concerns regarding medicines are outlined above.  Orders Placed This Encounter  Procedures   Basic metabolic panel   Lipid panel   Hepatic function panel    Meds ordered this encounter  Medications   hydrochlorothiazide (MICROZIDE) 12.5 MG capsule    Sig: Take 1 capsule (12.5 mg total) by mouth daily.    Dispense:  90 capsule    Refill:  3   aspirin EC 81 MG tablet    Sig: Take 1 tablet (81 mg total) by mouth daily. Swallow whole.    Dispense:  90 tablet    Refill:  3     There are no Patient Instructions on file for this visit.   Signed, Werner Lean, MD  05/05/2021 9:35 AM    [place]

## 2021-05-05 ENCOUNTER — Other Ambulatory Visit: Payer: Self-pay

## 2021-05-05 ENCOUNTER — Ambulatory Visit (INDEPENDENT_AMBULATORY_CARE_PROVIDER_SITE_OTHER): Payer: Medicare Other | Admitting: Internal Medicine

## 2021-05-05 ENCOUNTER — Encounter: Payer: Self-pay | Admitting: Internal Medicine

## 2021-05-05 VITALS — BP 140/72 | HR 72 | Ht 65.0 in | Wt 174.0 lb

## 2021-05-05 DIAGNOSIS — I700 Atherosclerosis of aorta: Secondary | ICD-10-CM

## 2021-05-05 DIAGNOSIS — I2510 Atherosclerotic heart disease of native coronary artery without angina pectoris: Secondary | ICD-10-CM | POA: Diagnosis not present

## 2021-05-05 DIAGNOSIS — I10 Essential (primary) hypertension: Secondary | ICD-10-CM | POA: Diagnosis not present

## 2021-05-05 DIAGNOSIS — E785 Hyperlipidemia, unspecified: Secondary | ICD-10-CM | POA: Diagnosis not present

## 2021-05-05 DIAGNOSIS — I2584 Coronary atherosclerosis due to calcified coronary lesion: Secondary | ICD-10-CM

## 2021-05-05 MED ORDER — HYDROCHLOROTHIAZIDE 12.5 MG PO CAPS: 12.5000 mg | ORAL_CAPSULE | Freq: Every day | ORAL | 3 refills | Status: DC

## 2021-05-05 MED ORDER — ASPIRIN EC 81 MG PO TBEC: 81.0000 mg | DELAYED_RELEASE_TABLET | Freq: Every day | ORAL | 3 refills | Status: DC

## 2021-05-05 NOTE — Patient Instructions (Signed)
Medication Instructions:  Your physician has recommended you make the following change in your medication: START: Aspirin 81 mg by mouth daily. (Please stop taking and call the office if you notice red stools)  START: hydrochlorothiazide (HCTZ) 12.5 mg by mouth daily  *If you need a refill on your cardiac medications before your next appointment, please call your pharmacy*   Lab Work: IN 1 WEEK: BMP, LFT, FLP (Nothing to eat or drink except water 8 hours prior to lab draw)  If you have labs (blood work) drawn today and your tests are completely normal, you will receive your results only by: [place] (if you have MyChart) OR A paper copy in the mail If you have any lab test that is abnormal or we need to change your treatment, we will call you to review the results.   Testing/Procedures: NONE   Follow-Up: At [facility], you and your health needs are our priority.  As part of our continuing mission to provide you with exceptional heart care, we have created designated Provider Care Teams.  These Care Teams include your primary Cardiologist (physician) and Advanced Practice Providers (APPs -  Physician Assistants and Nurse Practitioners) who all work together to provide you with the care you need, when you need it.  We recommend signing up for the patient portal called "MyChart".  Sign up information is provided on this After Visit Summary.  MyChart is used to connect with patients for Virtual Visits (Telemedicine).  Patients are able to view lab/test results, encounter notes, upcoming appointments, etc.  Non-urgent messages can be sent to your provider as well.   To learn more about what you can do with MyChart, go to [email or web address].    Your next appointment:   6 month(s)  The format for your next appointment:   In Person  Provider:   You may see Werner Lean, MD or one of the following Advanced Practice Providers on your designated Care Team:   Melina Copa, PA-C Ermalinda Barrios, PA-C

## 2021-05-06 DIAGNOSIS — Z01411 Encounter for gynecological examination (general) (routine) with abnormal findings: Secondary | ICD-10-CM | POA: Diagnosis not present

## 2021-05-06 DIAGNOSIS — Z01419 Encounter for gynecological examination (general) (routine) without abnormal findings: Secondary | ICD-10-CM | POA: Diagnosis not present

## 2021-05-06 DIAGNOSIS — Z1231 Encounter for screening mammogram for malignant neoplasm of breast: Secondary | ICD-10-CM | POA: Diagnosis not present

## 2021-05-06 DIAGNOSIS — Z6829 Body mass index (BMI) 29.0-29.9, adult: Secondary | ICD-10-CM | POA: Diagnosis not present

## 2021-05-06 DIAGNOSIS — Z124 Encounter for screening for malignant neoplasm of cervix: Secondary | ICD-10-CM | POA: Diagnosis not present

## 2021-05-13 ENCOUNTER — Other Ambulatory Visit: Payer: Self-pay

## 2021-05-13 ENCOUNTER — Other Ambulatory Visit: Payer: Medicare Other | Admitting: *Deleted

## 2021-05-13 DIAGNOSIS — I2510 Atherosclerotic heart disease of native coronary artery without angina pectoris: Secondary | ICD-10-CM | POA: Diagnosis not present

## 2021-05-13 DIAGNOSIS — I10 Essential (primary) hypertension: Secondary | ICD-10-CM | POA: Diagnosis not present

## 2021-05-13 DIAGNOSIS — E785 Hyperlipidemia, unspecified: Secondary | ICD-10-CM

## 2021-05-13 DIAGNOSIS — I2584 Coronary atherosclerosis due to calcified coronary lesion: Secondary | ICD-10-CM | POA: Diagnosis not present

## 2021-05-13 DIAGNOSIS — I700 Atherosclerosis of aorta: Secondary | ICD-10-CM

## 2021-05-13 LAB — HEPATIC FUNCTION PANEL
ALT: 22 IU/L (ref 0–32)
AST: 20 IU/L (ref 0–40)
Albumin: 4.4 g/dL (ref 3.8–4.8)
Alkaline Phosphatase: 105 IU/L (ref 44–121)
Bilirubin Total: 0.4 mg/dL (ref 0.0–1.2)
Bilirubin, Direct: 0.13 mg/dL (ref 0.00–0.40)
Total Protein: 6.4 g/dL (ref 6.0–8.5)

## 2021-05-13 LAB — BASIC METABOLIC PANEL
BUN/Creatinine Ratio: 17 (ref 12–28)
BUN: 17 mg/dL (ref 8–27)
CO2: 25 mmol/L (ref 20–29)
Calcium: 9.6 mg/dL (ref 8.7–10.3)
Chloride: 98 mmol/L (ref 96–106)
Creatinine, Ser: 1.03 mg/dL — ABNORMAL HIGH (ref 0.57–1.00)
Glucose: 130 mg/dL — ABNORMAL HIGH (ref 65–99)
Potassium: 3.8 mmol/L (ref 3.5–5.2)
Sodium: 139 mmol/L (ref 134–144)
eGFR: 59 mL/min/{1.73_m2} — ABNORMAL LOW (ref >59)

## 2021-05-13 LAB — LIPID PANEL
Chol/HDL Ratio: 3 ratio (ref 0.0–4.4)
Cholesterol, Total: 124 mg/dL (ref 100–199)
HDL: 42 mg/dL (ref >39)
LDL Chol Calc (NIH): 55 mg/dL (ref 0–99)
Triglycerides: 162 mg/dL — ABNORMAL HIGH (ref 0–149)
VLDL Cholesterol Cal: 27 mg/dL (ref 5–40)

## 2021-05-19 ENCOUNTER — Telehealth: Payer: Self-pay

## 2021-05-19 DIAGNOSIS — I10 Essential (primary) hypertension: Secondary | ICD-10-CM

## 2021-05-19 NOTE — Telephone Encounter (Signed)
-----   Message from Werner Lean, MD sent at 05/16/2021  2:10 PM EDT ----- Results: Slight increase in kidney numbers Cholesterol at goal Plan: BMP in one month  Werner Lean, MD

## 2021-05-19 NOTE — Telephone Encounter (Signed)
The patient has been notified of the result and verbalized understanding.  All questions (if any) were answered. Precious Gilding, RN 05/19/2021 10:19 AM

## 2021-05-31 DIAGNOSIS — Z23 Encounter for immunization: Secondary | ICD-10-CM | POA: Diagnosis not present

## 2021-06-19 ENCOUNTER — Other Ambulatory Visit: Payer: Medicare Other

## 2021-06-19 ENCOUNTER — Other Ambulatory Visit: Payer: Self-pay

## 2021-06-19 DIAGNOSIS — I10 Essential (primary) hypertension: Secondary | ICD-10-CM | POA: Diagnosis not present

## 2021-06-19 LAB — BASIC METABOLIC PANEL
BUN/Creatinine Ratio: 23 (ref 12–28)
BUN: 23 mg/dL (ref 8–27)
CO2: 25 mmol/L (ref 20–29)
Calcium: 9.5 mg/dL (ref 8.7–10.3)
Chloride: 99 mmol/L (ref 96–106)
Creatinine, Ser: 1 mg/dL (ref 0.57–1.00)
Glucose: 121 mg/dL — ABNORMAL HIGH (ref 70–99)
Potassium: 3.7 mmol/L (ref 3.5–5.2)
Sodium: 139 mmol/L (ref 134–144)
eGFR: 61 mL/min/{1.73_m2} (ref >59)

## 2021-06-25 ENCOUNTER — Telehealth: Payer: Self-pay | Admitting: Internal Medicine

## 2021-06-25 MED ORDER — ATORVASTATIN CALCIUM 80 MG PO TABS: 80.0000 mg | ORAL_TABLET | Freq: Every day | ORAL | 2 refills | Status: DC

## 2021-06-25 NOTE — Telephone Encounter (Signed)
Patient requesting refill  Patient states pharmacy denied refill for atorvastatin (LIPITOR) 80 MG tablet  [place], [place] AT [place]

## 2021-06-27 ENCOUNTER — Other Ambulatory Visit: Payer: Self-pay | Admitting: Internal Medicine

## 2021-07-24 DIAGNOSIS — Z96651 Presence of right artificial knee joint: Secondary | ICD-10-CM | POA: Diagnosis not present

## 2021-07-29 DIAGNOSIS — J208 Acute bronchitis due to other specified organisms: Secondary | ICD-10-CM | POA: Diagnosis not present

## 2021-07-29 DIAGNOSIS — R059 Cough, unspecified: Secondary | ICD-10-CM | POA: Diagnosis not present

## 2021-07-29 DIAGNOSIS — R519 Headache, unspecified: Secondary | ICD-10-CM | POA: Diagnosis not present

## 2021-07-29 DIAGNOSIS — B9689 Other specified bacterial agents as the cause of diseases classified elsewhere: Secondary | ICD-10-CM | POA: Diagnosis not present

## 2021-08-07 LAB — HM DIABETES EYE EXAM

## 2021-09-24 ENCOUNTER — Other Ambulatory Visit: Payer: Self-pay | Admitting: Internal Medicine

## 2021-09-24 NOTE — Telephone Encounter (Signed)
Please refill as per office routine med refill policy (all routine meds to be refilled for 3 mo or monthly (per pt preference) up to one year from last visit, then month to month grace period for 3 mo, then further med refills will have to be denied) ? ?

## 2021-10-07 ENCOUNTER — Ambulatory Visit: Payer: Medicare Other | Admitting: Internal Medicine

## 2021-10-23 ENCOUNTER — Encounter: Payer: Self-pay | Admitting: Internal Medicine

## 2021-10-23 ENCOUNTER — Ambulatory Visit (INDEPENDENT_AMBULATORY_CARE_PROVIDER_SITE_OTHER): Payer: Medicare Other | Admitting: Internal Medicine

## 2021-10-23 ENCOUNTER — Other Ambulatory Visit: Payer: Self-pay

## 2021-10-23 VITALS — BP 166/80 | HR 66 | Temp 97.8°F | Ht 65.0 in | Wt 175.0 lb

## 2021-10-23 DIAGNOSIS — E559 Vitamin D deficiency, unspecified: Secondary | ICD-10-CM | POA: Diagnosis not present

## 2021-10-23 DIAGNOSIS — I10 Essential (primary) hypertension: Secondary | ICD-10-CM | POA: Diagnosis not present

## 2021-10-23 DIAGNOSIS — R7302 Impaired glucose tolerance (oral): Secondary | ICD-10-CM

## 2021-10-23 DIAGNOSIS — I2510 Atherosclerotic heart disease of native coronary artery without angina pectoris: Secondary | ICD-10-CM

## 2021-10-23 DIAGNOSIS — E538 Deficiency of other specified B group vitamins: Secondary | ICD-10-CM

## 2021-10-23 DIAGNOSIS — E7800 Pure hypercholesterolemia, unspecified: Secondary | ICD-10-CM | POA: Diagnosis not present

## 2021-10-23 DIAGNOSIS — I700 Atherosclerosis of aorta: Secondary | ICD-10-CM

## 2021-10-23 DIAGNOSIS — I2584 Coronary atherosclerosis due to calcified coronary lesion: Secondary | ICD-10-CM

## 2021-10-23 LAB — CBC WITH DIFFERENTIAL/PLATELET
Basophils Absolute: 0 10*3/uL (ref 0.0–0.1)
Basophils Relative: 0.5 % (ref 0.0–3.0)
Eosinophils Absolute: 0.1 10*3/uL (ref 0.0–0.7)
Eosinophils Relative: 1.7 % (ref 0.0–5.0)
HCT: 40 % (ref 36.0–46.0)
Hemoglobin: 13.5 g/dL (ref 12.0–15.0)
Lymphocytes Relative: 40.2 % (ref 12.0–46.0)
Lymphs Abs: 2 10*3/uL (ref 0.7–4.0)
MCHC: 33.9 g/dL (ref 30.0–36.0)
MCV: 86.5 fl (ref 78.0–100.0)
Monocytes Absolute: 0.4 10*3/uL (ref 0.1–1.0)
Monocytes Relative: 7.7 % (ref 3.0–12.0)
Neutro Abs: 2.4 10*3/uL (ref 1.4–7.7)
Neutrophils Relative %: 49.9 % (ref 43.0–77.0)
Platelets: 160 10*3/uL (ref 150.0–400.0)
RBC: 4.62 Mil/uL (ref 3.87–5.11)
RDW: 13.8 % (ref 11.5–15.5)
WBC: 4.9 10*3/uL (ref 4.0–10.5)

## 2021-10-23 LAB — BASIC METABOLIC PANEL
BUN: 17 mg/dL (ref 6–23)
CO2: 34 mEq/L — ABNORMAL HIGH (ref 19–32)
Calcium: 10 mg/dL (ref 8.4–10.5)
Chloride: 102 mEq/L (ref 96–112)
Creatinine, Ser: 0.85 mg/dL (ref 0.40–1.20)
GFR: 70.22 mL/min (ref >60.00)
Glucose, Bld: 101 mg/dL — ABNORMAL HIGH (ref 70–99)
Potassium: 3.6 mEq/L (ref 3.5–5.1)
Sodium: 139 mEq/L (ref 135–145)

## 2021-10-23 LAB — URINALYSIS, ROUTINE W REFLEX MICROSCOPIC
Bilirubin Urine: NEGATIVE
Ketones, ur: NEGATIVE
Nitrite: NEGATIVE
Specific Gravity, Urine: 1.015 (ref 1.000–1.030)
Total Protein, Urine: NEGATIVE
Urine Glucose: NEGATIVE
Urobilinogen, UA: 0.2 (ref 0.0–1.0)
pH: 6 (ref 5.0–8.0)

## 2021-10-23 LAB — VITAMIN D 25 HYDROXY (VIT D DEFICIENCY, FRACTURES): VITD: 52.44 ng/mL (ref 30.00–100.00)

## 2021-10-23 LAB — LIPID PANEL
Cholesterol: 135 mg/dL (ref 0–200)
HDL: 53.1 mg/dL (ref >39.00)
LDL Cholesterol: 53 mg/dL (ref 0–99)
NonHDL: 82.25
Total CHOL/HDL Ratio: 3
Triglycerides: 146 mg/dL (ref 0.0–149.0)
VLDL: 29.2 mg/dL (ref 0.0–40.0)

## 2021-10-23 LAB — HEMOGLOBIN A1C: Hgb A1c MFr Bld: 6.1 % (ref 4.6–6.5)

## 2021-10-23 LAB — HEPATIC FUNCTION PANEL
ALT: 28 U/L (ref 0–35)
AST: 24 U/L (ref 0–37)
Albumin: 4.4 g/dL (ref 3.5–5.2)
Alkaline Phosphatase: 69 U/L (ref 39–117)
Bilirubin, Direct: 0.1 mg/dL (ref 0.0–0.3)
Total Bilirubin: 0.5 mg/dL (ref 0.2–1.2)
Total Protein: 7.4 g/dL (ref 6.0–8.3)

## 2021-10-23 LAB — VITAMIN B12: Vitamin B-12: 343 pg/mL (ref 211–911)

## 2021-10-23 LAB — TSH: TSH: 0.88 u[IU]/mL (ref 0.35–5.50)

## 2021-10-23 MED ORDER — METOPROLOL SUCCINATE ER 50 MG PO TB24: 50.0000 mg | ORAL_TABLET | Freq: Every day | ORAL | 3 refills | Status: DC

## 2021-10-23 MED ORDER — ALPRAZOLAM 1 MG PO TABS: ORAL_TABLET | ORAL | 5 refills | Status: DC

## 2021-10-23 NOTE — Progress Notes (Signed)
Patient ID: Molly Lewis, female   DOB: 1951/04/11, 71 y.o.   MRN: [id number]         Chief Complaint:: yearly exam       HPI:  Molly Lewis is a 71 y.o. female overall doing ok.  BP has been mildly increased recently with wt gain.  Pt denies chest pain, increased sob or doe, wheezing, orthopnea, PND, increased LE swelling, palpitations, dizziness or syncope.   Pt denies polydipsia, polyuria, or new focal neuro s/s.   Pt denies fever, wt loss, night sweats, loss of appetite, or other constitutional symptoms  Anxeity some improved recently, delcines to take celexa further.  Working in retirement part time.  Due for cardiology f/u.  Due for pulm nodules f/u Ct in 6 months Wt Readings from Last 3 Encounters:  10/23/21 175 lb (79.4 kg)  05/05/21 174 lb (78.9 kg)  04/03/21 176 lb (79.8 kg)   BP Readings from Last 3 Encounters:  10/23/21 (!) 166/80  05/05/21 140/72  04/03/21 (!) 162/80   Immunization History  Administered Date(s) Administered   Influenza Split 06/13/2012   Influenza Whole 06/27/2008, 06/26/2009, 09/10/2010   Influenza,inj,Quad PF,6+ Mos 07/19/2013, 07/27/2014, 07/31/2015, 09/11/2016, 06/17/2017   Influenza,inj,quad, With Preservative 06/08/2019   Influenza-Unspecified 06/14/2018, 05/31/2021   Moderna Sars-Covid-2 Vaccination 11/24/2019, 12/22/2019, 07/20/2020, 05/31/2021   Pneumococcal Conjugate-13 06/14/2018, 06/14/2018   Pneumococcal Polysaccharide-23 10/13/2017   Pneumococcal-Unspecified 06/08/2019   Td 06/26/2009   Tdap 10/19/2019   Tetanus 09/08/2019   There are no preventive care reminders to display for this patient.     Past Medical History:  Diagnosis Date   Allergic rhinitis, cause unspecified 07/18/2011   Allergy    ANXIETY 06/23/2007   BREAST BIOPSY, HX OF 06/23/2007   benign   Bursitis    Cataract    bilateral lens implants   Chronic back pain    Chronic constipation    "i have had it all my life and the last time i had a colonoscopy they  tol me i have a distened colon because of it"    COLONIC POLYPS, HX OF 06/27/2008   COMMON MIGRAINE 06/23/2007   occasional   DEPRESSION 06/23/2007   Detached retina    GERD 06/23/2007   HYPERLIPIDEMIA 06/23/2007   Hypertension    Impaired glucose tolerance 02/16/2011   INSOMNIA-SLEEP DISORDER-UNSPEC 06/26/2009   Irritable bowel syndrome 06/23/2007   diet controlled   Microhematuria 07/31/2015   OSTEOARTHRITIS, LUMBAR SPINE 06/23/2007   OSTEOPENIA 07/11/2008   Pre-diabetes    Past Surgical History:  Procedure Laterality Date   APPENDECTOMY     BIOPSY  09/19/2020   Procedure: BIOPSY;  Surgeon: Irving Copas., MD;  Location: [place];  Service: Gastroenterology;;   CATARACT EXTRACTION     BIL   colonoscopy  02/2019   COLONOSCOPY  09/2013, 07/2008, 08/2003, 07/1999   multiple   COLONOSCOPY WITH PROPOFOL N/A 03/29/2019   Procedure: COLONOSCOPY WITH PROPOFOL;  Surgeon: Irving Copas., MD;  Location: [facility];  Service: Gastroenterology;  Laterality: N/A;   COLONOSCOPY WITH PROPOFOL N/A 09/19/2020   Procedure: COLONOSCOPY WITH PROPOFOL;  Surgeon: Rush Landmark Telford Nab., MD;  Location: [place];  Service: Gastroenterology;  Laterality: N/A;   ENDOSCOPIC MUCOSAL RESECTION N/A 03/29/2019   Procedure: ENDOSCOPIC MUCOSAL RESECTION;  Surgeon: Rush Landmark Telford Nab., MD;  Location: [facility];  Service: Gastroenterology;  Laterality: N/A;   HEMOSTASIS CLIP PLACEMENT  03/29/2019   Procedure: HEMOSTASIS CLIP PLACEMENT;  Surgeon: Irving Copas., MD;  Location:  [facility];  Service: Gastroenterology;;   POLYPECTOMY  09/19/2020   Procedure: POLYPECTOMY;  Surgeon: Irving Copas., MD;  Location: [place];  Service: Gastroenterology;;   REPLACEMENT TOTAL KNEE Right 04/2020   RETINAL DETACHMENT SURGERY Right 01/2019   RETINAL TEAR REPAIR CRYOTHERAPY Right    SUBMUCOSAL LIFTING INJECTION  03/29/2019   Procedure: SUBMUCOSAL LIFTING INJECTION;   Surgeon: Irving Copas., MD;  Location: [facility];  Service: Gastroenterology;;   TOTAL ABDOMINAL HYSTERECTOMY     UPPER GASTROINTESTINAL ENDOSCOPY  10/2003   WISDOM TOOTH EXTRACTION      reports that she quit smoking about 12 years ago. Her smoking use included cigarettes. She has a 35.00 pack-year smoking history. She has never used smokeless tobacco. She reports that she does not currently use alcohol. She reports that she does not use drugs. family history includes Brain cancer in her maternal grandmother and mother; Heart disease in her father; Hypertension in her father. Allergies  Allergen Reactions   Codeine Nausea And Vomiting    Violent vomiting   Current Outpatient Medications on File Prior to Visit  Medication Sig Dispense Refill   albuterol (VENTOLIN HFA) 108 (90 Base) MCG/ACT inhaler Inhale 2 puffs into the lungs every 6 (six) hours as needed for wheezing or shortness of breath. 8 g 5   albuterol (VENTOLIN HFA) 108 (90 Base) MCG/ACT inhaler Inhale into the lungs every 6 (six) hours as needed for wheezing or shortness of breath.     aspirin EC 81 MG tablet Take 1 tablet (81 mg total) by mouth daily. Swallow whole. 90 tablet 3   atorvastatin (LIPITOR) 80 MG tablet Take 1 tablet (80 mg total) by mouth daily. 90 tablet 2   Bacillus Coagulans-Inulin (PROBIOTIC-PREBIOTIC PO) Take 1 capsule by mouth daily.     Calcium-Magnesium-Vitamin D (CALCIUM 1200+D3 PO) Take 1 tablet by mouth daily.     celecoxib (CELEBREX) 200 MG capsule TAKE 1 CAPSULE(200 MG) BY MOUTH TWICE DAILY AS NEEDED FOR MILD PAIN OR MODERATE PAIN 180 capsule 1   ezetimibe (ZETIA) 10 MG tablet Take 1 tablet (10 mg total) by mouth daily. 90 tablet 3   hydrochlorothiazide (MICROZIDE) 12.5 MG capsule Take 1 capsule (12.5 mg total) by mouth daily. 90 capsule 3   losartan (COZAAR) 100 MG tablet TAKE 1 TABLET(100 MG) BY MOUTH DAILY 90 tablet 0   metroNIDAZOLE (METROGEL) 0.75 % gel Apply 1 application topically 2  (two) times daily.   2   Multiple Vitamin (MULTIVITAMIN WITH MINERALS) TABS tablet Take 1 tablet by mouth daily.     omeprazole (PRILOSEC) 20 MG capsule TAKE 1 CAPSULE BY MOUTH TWICE DAILY 180 capsule 3   clonazePAM (KLONOPIN) 0.5 MG tablet Take 1 tablet (0.5 mg total) by mouth 2 (two) times daily as needed for anxiety. (Patient not taking: Reported on 10/23/2021) 60 tablet 2   gabapentin (NEURONTIN) 300 MG capsule 300 mg daily as needed. (Patient not taking: Reported on 10/23/2021)     No current facility-administered medications on file prior to visit.        ROS:  All others reviewed and negative.  Objective        PE:  BP (!) 166/80 (BP Location: Right Arm, Patient Position: Sitting, Cuff Size: Large)    Pulse 66    Temp 97.8 F (36.6 C) (Oral)    Ht 5\' 5"  (1.651 m)    Wt 175 lb (79.4 kg)    LMP  (LMP Unknown)    SpO2 96%  BMI 29.12 kg/m                 Constitutional: Pt appears in NAD               HENT: Head: NCAT.                Right Ear: External ear normal.                 Left Ear: External ear normal.                Eyes: . Pupils are equal, round, and reactive to light. Conjunctivae and EOM are normal               Nose: without d/c or deformity               Neck: Neck supple. Gross normal ROM               Cardiovascular: Normal rate and regular rhythm.                 Pulmonary/Chest: Effort normal and breath sounds without rales or wheezing.                Abd:  Soft, NT, ND, + BS, no organomegaly               Neurological: Pt is alert. At baseline orientation, motor grossly intact               Skin: Skin is warm. No rashes, no other new lesions, LE edema - none               Psychiatric: Pt behavior is normal without agitation   Micro: none  Cardiac tracings I have personally interpreted today:  none  Pertinent Radiological findings (summarize): none   Lab Results  Component Value Date   WBC 4.9 10/23/2021   HGB 13.5 10/23/2021   HCT 40.0 10/23/2021   PLT  160.0 10/23/2021   GLUCOSE 101 (H) 10/23/2021   CHOL 135 10/23/2021   TRIG 146.0 10/23/2021   HDL 53.10 10/23/2021   LDLDIRECT 90.0 10/15/2020   LDLCALC 53 10/23/2021   ALT 28 10/23/2021   AST 24 10/23/2021   NA 139 10/23/2021   K 3.6 10/23/2021   CL 102 10/23/2021   CREATININE 0.85 10/23/2021   BUN 17 10/23/2021   CO2 34 (H) 10/23/2021   TSH 0.88 10/23/2021   HGBA1C 6.1 10/23/2021   Assessment/Plan:  Molly Lewis is a 71 y.o. White or Caucasian [1] female with  has a past medical history of Allergic rhinitis, cause unspecified (07/18/2011), Allergy, ANXIETY (06/23/2007), BREAST BIOPSY, HX OF (06/23/2007), Bursitis, Cataract, Chronic back pain, Chronic constipation, COLONIC POLYPS, HX OF (06/27/2008), COMMON MIGRAINE (06/23/2007), DEPRESSION (06/23/2007), Detached retina, GERD (06/23/2007), HYPERLIPIDEMIA (06/23/2007), Hypertension, Impaired glucose tolerance (02/16/2011), INSOMNIA-SLEEP DISORDER-UNSPEC (06/26/2009), Irritable bowel syndrome (06/23/2007), Microhematuria (07/31/2015), OSTEOARTHRITIS, LUMBAR SPINE (06/23/2007), OSTEOPENIA (07/11/2008), and Pre-diabetes.  Coronary artery calcification For cardiology referral  Aortic atherosclerosis ([facility]) Pt to continue lipitor, exercise, low chol diet  Hyperlipidemia Lab Results  Component Value Date   LDLCALC 53 10/23/2021   Stable, pt to continue current statin lipitor 80   Hypertension BP Readings from Last 3 Encounters:  10/23/21 (!) 166/80  05/05/21 140/72  04/03/21 (!) 162/80   uncontroleld, pt to add toprol xl 50 qd   Impaired glucose tolerance . Lab Results  Component Value Date   HGBA1C 6.1 10/23/2021   Stable, pt  to continue current medical treatment   - diet  Followup: Return in about 6 months (around 04/22/2022).  Cathlean Cower, MD 10/26/2021 10:15 PM [facility]

## 2021-10-23 NOTE — Patient Instructions (Signed)
Please take all new medication as prescribed - the metoprolol ER 50 mg per day  Please continue all other medications as before, and refills have been done if requested.  Please have the pharmacy call with any other refills you may need.  Please continue your efforts at being more active, low cholesterol diet, and weight control.  You are otherwise up to date with prevention measures today.  Please keep your appointments with your specialists as you may have planned  You will be contacted regarding the referral for: cardiology  Please go to the LAB at the blood drawing area for the tests to be done  You will be contacted by phone if any changes need to be made immediately.  Otherwise, you will receive a letter about your results with an explanation, but please check with MyChart first.  Please remember to sign up for MyChart if you have not done so, as this will be important to you in the future with finding out test results, communicating by private email, and scheduling acute appointments online when needed.  Please make an Appointment to return in 6 months, or sooner if needed, when we may need to do the follow up Chest CT scan

## 2021-10-26 ENCOUNTER — Encounter: Payer: Self-pay | Admitting: Internal Medicine

## 2021-10-26 NOTE — Assessment & Plan Note (Signed)
. °  Lab Results  Component Value Date   HGBA1C 6.1 10/23/2021   Stable, pt to continue current medical treatment   - diet

## 2021-10-26 NOTE — Assessment & Plan Note (Signed)
BP Readings from Last 3 Encounters:  10/23/21 (!) 166/80  05/05/21 140/72  04/03/21 (!) 162/80   uncontroleld, pt to add toprol xl 50 qd

## 2021-10-26 NOTE — Assessment & Plan Note (Signed)
Lab Results  Component Value Date   LDLCALC 53 10/23/2021   Stable, pt to continue current statin lipitor 80

## 2021-10-26 NOTE — Assessment & Plan Note (Signed)
For cardiology referral

## 2021-10-26 NOTE — Assessment & Plan Note (Signed)
Pt to continue lipitor, exercise, low chol diet

## 2021-11-06 NOTE — Progress Notes (Signed)
Cardiology Office Note:    Date:  11/07/2021   ID:  Molly, Lewis 11/27/1950, MRN [id number]  PCP:  Biagio Borg, MD   [facility] Providers Cardiologist:  Werner Lean, MD     CC: SOB follow up  History of Present Illness:    Molly Lewis is a 71 y.o. female with a hx of HTN, HLD, CAC, and Aortic Atherosclerosis  seen in 2022.  Had negative stress test and LDL was improving at last visit, BMP increased.  Patient notes that she is doing well.   Started metoprolol for blood pressure and anxiety with PC and stopped losartan. There are no interval hospital/ED visit.    No chest pain or pressure .  No SOB/DOE and no PND/Orthopnea.  No weight gain or leg swelling.  No palpitations or syncope.  She is still active with two jobs.  A family friend recently had a heart attack.  She wonders why she is one so many medications for heart attack, HF, and stroke prevention.  Ambulatory blood pressure runs high.  Past Medical History:  Diagnosis Date   Allergic rhinitis, cause unspecified 07/18/2011   Allergy    ANXIETY 06/23/2007   BREAST BIOPSY, HX OF 06/23/2007   benign   Bursitis    Cataract    bilateral lens implants   Chronic back pain    Chronic constipation    "i have had it all my life and the last time i had a colonoscopy they tol me i have a distened colon because of it"    COLONIC POLYPS, HX OF 06/27/2008   COMMON MIGRAINE 06/23/2007   occasional   DEPRESSION 06/23/2007   Detached retina    GERD 06/23/2007   HYPERLIPIDEMIA 06/23/2007   Hypertension    Impaired glucose tolerance 02/16/2011   INSOMNIA-SLEEP DISORDER-UNSPEC 06/26/2009   Irritable bowel syndrome 06/23/2007   diet controlled   Microhematuria 07/31/2015   OSTEOARTHRITIS, LUMBAR SPINE 06/23/2007   OSTEOPENIA 07/11/2008   Pre-diabetes     Past Surgical History:  Procedure Laterality Date   APPENDECTOMY     BIOPSY  09/19/2020   Procedure: BIOPSY;  Surgeon: Irving Copas., MD;   Location: [facility];  Service: Gastroenterology;;   CATARACT EXTRACTION     BIL   colonoscopy  02/2019   COLONOSCOPY  09/2013, 07/2008, 08/2003, 07/1999   multiple   COLONOSCOPY WITH PROPOFOL N/A 03/29/2019   Procedure: COLONOSCOPY WITH PROPOFOL;  Surgeon: Irving Copas., MD;  Location: [facility];  Service: Gastroenterology;  Laterality: N/A;   COLONOSCOPY WITH PROPOFOL N/A 09/19/2020   Procedure: COLONOSCOPY WITH PROPOFOL;  Surgeon: Rush Landmark Telford Nab., MD;  Location: [facility];  Service: Gastroenterology;  Laterality: N/A;   ENDOSCOPIC MUCOSAL RESECTION N/A 03/29/2019   Procedure: ENDOSCOPIC MUCOSAL RESECTION;  Surgeon: Rush Landmark Telford Nab., MD;  Location: [facility];  Service: Gastroenterology;  Laterality: N/A;   HEMOSTASIS CLIP PLACEMENT  03/29/2019   Procedure: HEMOSTASIS CLIP PLACEMENT;  Surgeon: Irving Copas., MD;  Location: [facility];  Service: Gastroenterology;;   POLYPECTOMY  09/19/2020   Procedure: POLYPECTOMY;  Surgeon: Irving Copas., MD;  Location: [facility];  Service: Gastroenterology;;   REPLACEMENT TOTAL KNEE Right 04/2020   RETINAL DETACHMENT SURGERY Right 01/2019   RETINAL TEAR REPAIR CRYOTHERAPY Right    SUBMUCOSAL LIFTING INJECTION  03/29/2019   Procedure: SUBMUCOSAL LIFTING INJECTION;  Surgeon: Irving Copas., MD;  Location: [facility];  Service: Gastroenterology;;   TOTAL ABDOMINAL HYSTERECTOMY  UPPER GASTROINTESTINAL ENDOSCOPY  10/2003   WISDOM TOOTH EXTRACTION      Current Medications: Current Meds  Medication Sig   ALPRAZolam (XANAX) 1 MG tablet 1 tab by mouth twice per day as needed   aspirin EC 81 MG tablet Take 1 tablet (81 mg total) by mouth daily. Swallow whole.   atorvastatin (LIPITOR) 80 MG tablet Take 1 tablet (80 mg total) by mouth daily.   Bacillus Coagulans-Inulin (PROBIOTIC-PREBIOTIC PO) Take 1 capsule by mouth daily.   Calcium-Magnesium-Vitamin D (CALCIUM 1200+D3 PO) Take 1 tablet by  mouth daily.   celecoxib (CELEBREX) 200 MG capsule TAKE 1 CAPSULE(200 MG) BY MOUTH TWICE DAILY AS NEEDED FOR MILD PAIN OR MODERATE PAIN   ezetimibe (ZETIA) 10 MG tablet Take 1 tablet (10 mg total) by mouth daily.   hydrochlorothiazide (MICROZIDE) 12.5 MG capsule Take 1 capsule (12.5 mg total) by mouth daily.   losartan (COZAAR) 100 MG tablet TAKE 1 TABLET(100 MG) BY MOUTH DAILY   metoprolol succinate (TOPROL-XL) 50 MG 24 hr tablet Take 1 tablet (50 mg total) by mouth daily. Take with or immediately following a meal.   metroNIDAZOLE (METROGEL) 0.75 % gel Apply 1 application topically 2 (two) times daily.    Multiple Vitamin (MULTIVITAMIN WITH MINERALS) TABS tablet Take 1 tablet by mouth daily.   omeprazole (PRILOSEC) 20 MG capsule TAKE 1 CAPSULE BY MOUTH TWICE DAILY     Allergies:   Codeine   Social History   Socioeconomic History   Marital status: Married    Spouse name: Not on file   Number of children: 2   Years of education: Not on file   Highest education level: Not on file  Occupational History   Not on file  Tobacco Use   Smoking status: Former    Packs/day: 1.00    Years: 35.00    Pack years: 35.00    Types: Cigarettes    Quit date: 04/16/2009    Years since quitting: 12.5   Smokeless tobacco: Never   Tobacco comments:    Quit smoking 04/15/09  Vaping Use   Vaping Use: Never used  Substance and Sexual Activity   Alcohol use: Not Currently    Comment: occasional   Drug use: No   Sexual activity: Yes    Birth control/protection: Surgical    Comment: Hysterectomy  Other Topics Concern   Not on file  Social History Narrative   Not on file   Social Determinants of Health   Financial Resource Strain: Not on file  Food Insecurity: Not on file  Transportation Needs: Not on file  Physical Activity: Not on file  Stress: Not on file  Social Connections: Not on file    Social: back to work after retirement  Family History: The patient's family history includes  Brain cancer in her maternal grandmother and mother; Heart disease in her father; Hypertension in her father. There is no history of Colon cancer, Esophageal cancer, Rectal cancer, or Stomach cancer. History of coronary artery disease notable for father needing CABG. History of heart failure notable for no members. History of arrhythmia notable for no members Brother has FH.  ROS:   Please see the history of present illness.     All other systems reviewed and are negative.  EKGs/Labs/Other Studies Reviewed:    The following studies were reviewed today:  EKG:   02/04/21: Sr rate 63 borderline ant inf pattern  CAC: Date: 12/10/20 Results: AC 246 1. Scattered tiny subpleural pulmonary nodules bilaterally. All  of these nodules have the appearance of likely postinflammatory nodules. No follow-up needed if patient is low-risk (and has no known or suspected primary neoplasm). Non-contrast chest CT can be considered in 12 months if patient is high-risk. This recommendation follows the consensus statement: Guidelines for Management of Incidental Pulmonary Nodules Detected on CT Images: From the Fleischner Society 2017; Radiology 2017; 284:228-243. 2. Calcified plaque in the thoracic aorta. 3. Trace pericardial fluid.   Aortic Atherosclerosis  ECG or NM Stress Testing : Date: 02/24/21 Results: Addendum by Thayer Headings, MD on Mon Feb 24, 2021  3:15 PM Nuclear stress EF: 65%. The left ventricular ejection fraction is normal (55-65%). This is a low risk study. There is no evidence of ischemia and no evidence of previous infarction The study is normal.   Finalized by Thayer Headings, MD on Mon Feb 24, 2021  3:12 PM The left ventricular ejection fraction is normal (55-65%). Nuclear stress EF: 65%.   Recent Labs: 02/04/2021: NT-Pro BNP 54 10/23/2021: ALT 28; BUN 17; Creatinine, Ser 0.85; Hemoglobin 13.5; Platelets 160.0; Potassium 3.6; Sodium 139; TSH 0.88  Recent Lipid Panel     Component Value Date/Time   CHOL 135 10/23/2021 1035   CHOL 124 05/13/2021 0733   TRIG 146.0 10/23/2021 1035   HDL 53.10 10/23/2021 1035   HDL 42 05/13/2021 0733   CHOLHDL 3 10/23/2021 1035   VLDL 29.2 10/23/2021 1035   LDLCALC 53 10/23/2021 1035   LDLCALC 55 05/13/2021 0733   LDLDIRECT 90.0 10/15/2020 0942    Physical Exam:    VS:  BP 140/68    Pulse 69    Ht 5\' 5"  (1.651 m)    Wt 175 lb (79.4 kg)    LMP  (LMP Unknown)    SpO2 97%    BMI 29.12 kg/m     Wt Readings from Last 3 Encounters:  11/07/21 175 lb (79.4 kg)  10/23/21 175 lb (79.4 kg)  05/05/21 174 lb (78.9 kg)    Gen: no distress   Neck: No JVD Cardiac: No Rubs or Gallops, no murmur, RRR +2 radial pulses Respiratory: Clear to auscultation bilaterally, normal effort, normal  respiratory rate GI: Soft, nontender, non-distended  MS: No  edema;  moves all extremities Integument: Skin feels well Neuro:  At time of evaluation, alert and oriented to person/place/time/situation  Psych: Anxious affect and mood   ASSESSMENT:    1. Multiple pulmonary nodules   2. Aortic atherosclerosis ([place])   3. Primary hypertension     PLAN:    CAC and Aortic Atherosclerosis HTN HLD Pulmonary Nodules (multiple 2-3 mm former smoker) - needed CT in one year; this has not been followed up will get CT non-con Anxiety - LDL < 70; on  zetia 10 mg PO Daily atorvastatin 80 LDL 50 no change - amb BP is elevated - aspirin 81 mg - continue losartan 100 mg PO Daily - continue metoprolol 50 mg succinate - on HCTZ 12.5 mg PO daily - we reviewed [facility] medication techniques for anxiety and blood pressure  App 6 months Me one year    Medication Adjustments/Labs and Tests Ordered: Current medicines are reviewed at length with the patient today.  Concerns regarding medicines are outlined above.  Orders Placed This Encounter  Procedures   CT CHEST WO CONTRAST    No orders of the defined types were placed in this  encounter.    Patient Instructions  Medication Instructions:  Your physician recommends that you continue on your  current medications as directed. Please refer to the Current Medication list given to you today.  *If you need a refill on your cardiac medications before your next appointment, please call your pharmacy*   Lab Work: NONE If you have labs (blood work) drawn today and your tests are completely normal, you will receive your results only by: [place] (if you have MyChart) OR A paper copy in the mail If you have any lab test that is abnormal or we need to change your treatment, we will call you to review the results.   Testing/Procedures: Your provider has recommended that you have a CT of your chest.    Follow-Up: At [facility], you and your health needs are our priority.  As part of our continuing mission to provide you with exceptional heart care, we have created designated Provider Care Teams.  These Care Teams include your primary Cardiologist (physician) and Advanced Practice Providers (APPs -  Physician Assistants and Nurse Practitioners) who all work together to provide you with the care you need, when you need it.  We recommend signing up for the patient portal called "MyChart".  Sign up information is provided on this After Visit Summary.  MyChart is used to connect with patients for Virtual Visits (Telemedicine).  Patients are able to view lab/test results, encounter notes, upcoming appointments, etc.  Non-urgent messages can be sent to your provider as well.   To learn more about what you can do with MyChart, go to [email or web address].    Your next appointment:   6 month(s)  The format for your next appointment:   In Person  Provider:   Melina Copa, PA-C or Ermalinda Barrios, PA-C     Then, Werner Lean, MD will plan to see you again in 12 month(s). \    Signed, Werner Lean, MD  11/07/2021 9:48 AM    [place]

## 2021-11-07 ENCOUNTER — Ambulatory Visit (INDEPENDENT_AMBULATORY_CARE_PROVIDER_SITE_OTHER): Payer: Medicare Other | Admitting: Internal Medicine

## 2021-11-07 ENCOUNTER — Encounter: Payer: Self-pay | Admitting: Internal Medicine

## 2021-11-07 ENCOUNTER — Other Ambulatory Visit: Payer: Self-pay

## 2021-11-07 VITALS — BP 140/68 | HR 69 | Ht 65.0 in | Wt 175.0 lb

## 2021-11-07 DIAGNOSIS — R918 Other nonspecific abnormal finding of lung field: Secondary | ICD-10-CM | POA: Diagnosis not present

## 2021-11-07 DIAGNOSIS — I10 Essential (primary) hypertension: Secondary | ICD-10-CM

## 2021-11-07 DIAGNOSIS — I700 Atherosclerosis of aorta: Secondary | ICD-10-CM | POA: Diagnosis not present

## 2021-11-07 NOTE — Patient Instructions (Signed)
Medication Instructions:  ?Your physician recommends that you continue on your current medications as directed. Please refer to the Current Medication list given to you today. ? ?*If you need a refill on your cardiac medications before your next appointment, please call your pharmacy* ? ? ?Lab Work: ?NONE ?If you have labs (blood work) drawn today and your tests are completely normal, you will receive your results only by: ?MyChart Message (if you have MyChart) OR ?A paper copy in the mail ?If you have any lab test that is abnormal or we need to change your treatment, we will call you to review the results. ? ? ?Testing/Procedures: ?Your provider has recommended that you have a CT of your chest.  ? ? ?Follow-Up: ?At [facility], you and your health needs are our priority.  As part of our continuing mission to provide you with exceptional heart care, we have created designated Provider Care Teams.  These Care Teams include your primary Cardiologist (physician) and Advanced Practice Providers (APPs -  Physician Assistants and Nurse Practitioners) who all work together to provide you with the care you need, when you need it. ? ?We recommend signing up for the patient portal called "MyChart".  Sign up information is provided on this After Visit Summary.  MyChart is used to connect with patients for Virtual Visits (Telemedicine).  Patients are able to view lab/test results, encounter notes, upcoming appointments, etc.  Non-urgent messages can be sent to your provider as well.   ?To learn more about what you can do with MyChart, go to [email or web address].   ? ?Your next appointment:   ?6 month(s) ? ?The format for your next appointment:   ?In Person ? ?Provider:   ?Melina Copa, PA-C or Ermalinda Barrios, PA-C     Then, Werner Lean, MD will plan to see you again in 12 month(s). ?\  ?

## 2021-11-28 ENCOUNTER — Ambulatory Visit
Admission: RE | Admit: 2021-11-28 | Discharge: 2021-11-28 | Disposition: A | Discharge status: Home / Self Care | Payer: Medicare Other | Source: Ambulatory Visit | Attending: Internal Medicine | Admitting: Internal Medicine

## 2021-11-28 DIAGNOSIS — R918 Other nonspecific abnormal finding of lung field: Secondary | ICD-10-CM

## 2021-11-28 DIAGNOSIS — I700 Atherosclerosis of aorta: Secondary | ICD-10-CM | POA: Diagnosis not present

## 2021-11-28 DIAGNOSIS — R911 Solitary pulmonary nodule: Secondary | ICD-10-CM | POA: Diagnosis not present

## 2021-12-22 ENCOUNTER — Other Ambulatory Visit: Payer: Self-pay | Admitting: Internal Medicine

## 2021-12-22 NOTE — Telephone Encounter (Signed)
Please refill as per office routine med refill policy (all routine meds to be refilled for 3 mo or monthly (per pt preference) up to one year from last visit, then month to month grace period for 3 mo, then further med refills will have to be denied) ? ?

## 2022-01-14 ENCOUNTER — Other Ambulatory Visit: Payer: Self-pay

## 2022-01-14 ENCOUNTER — Telehealth: Payer: Self-pay

## 2022-01-14 MED ORDER — OMEPRAZOLE 20 MG PO CPDR: DELAYED_RELEASE_CAPSULE | ORAL | 3 refills | Status: DC

## 2022-01-14 NOTE — Telephone Encounter (Signed)
Pt is request refill on: ?omeprazole (PRILOSEC) 20 MG capsule ? ?Pharmacy: ?[facility], [place] AT [place] ? ?LOV 10/23/21  ?ROV 04/23/22 ?

## 2022-01-27 DIAGNOSIS — L820 Inflamed seborrheic keratosis: Secondary | ICD-10-CM | POA: Diagnosis not present

## 2022-01-27 DIAGNOSIS — D225 Melanocytic nevi of trunk: Secondary | ICD-10-CM | POA: Diagnosis not present

## 2022-01-27 DIAGNOSIS — L723 Sebaceous cyst: Secondary | ICD-10-CM | POA: Diagnosis not present

## 2022-01-27 DIAGNOSIS — D1801 Hemangioma of skin and subcutaneous tissue: Secondary | ICD-10-CM | POA: Diagnosis not present

## 2022-01-27 DIAGNOSIS — L718 Other rosacea: Secondary | ICD-10-CM | POA: Diagnosis not present

## 2022-01-27 DIAGNOSIS — L821 Other seborrheic keratosis: Secondary | ICD-10-CM | POA: Diagnosis not present

## 2022-01-27 DIAGNOSIS — L218 Other seborrheic dermatitis: Secondary | ICD-10-CM | POA: Diagnosis not present

## 2022-01-27 DIAGNOSIS — L812 Freckles: Secondary | ICD-10-CM | POA: Diagnosis not present

## 2022-01-27 DIAGNOSIS — D2262 Melanocytic nevi of left upper limb, including shoulder: Secondary | ICD-10-CM | POA: Diagnosis not present

## 2022-01-27 DIAGNOSIS — D2261 Melanocytic nevi of right upper limb, including shoulder: Secondary | ICD-10-CM | POA: Diagnosis not present

## 2022-01-29 ENCOUNTER — Ambulatory Visit (INDEPENDENT_AMBULATORY_CARE_PROVIDER_SITE_OTHER): Payer: Medicare Other | Admitting: Internal Medicine

## 2022-01-29 ENCOUNTER — Other Ambulatory Visit: Payer: Self-pay | Admitting: Internal Medicine

## 2022-01-29 ENCOUNTER — Ambulatory Visit (INDEPENDENT_AMBULATORY_CARE_PROVIDER_SITE_OTHER): Payer: Medicare Other

## 2022-01-29 VITALS — BP 130/74 | HR 62 | Temp 98.0°F | Ht 65.0 in | Wt 178.0 lb

## 2022-01-29 DIAGNOSIS — I2510 Atherosclerotic heart disease of native coronary artery without angina pectoris: Secondary | ICD-10-CM

## 2022-01-29 DIAGNOSIS — I2584 Coronary atherosclerosis due to calcified coronary lesion: Secondary | ICD-10-CM | POA: Diagnosis not present

## 2022-01-29 DIAGNOSIS — R3989 Other symptoms and signs involving the genitourinary system: Secondary | ICD-10-CM | POA: Diagnosis not present

## 2022-01-29 DIAGNOSIS — M545 Low back pain, unspecified: Secondary | ICD-10-CM

## 2022-01-29 DIAGNOSIS — F411 Generalized anxiety disorder: Secondary | ICD-10-CM

## 2022-01-29 DIAGNOSIS — I10 Essential (primary) hypertension: Secondary | ICD-10-CM

## 2022-01-29 LAB — URINALYSIS, ROUTINE W REFLEX MICROSCOPIC
Bilirubin Urine: NEGATIVE
Hgb urine dipstick: NEGATIVE
Ketones, ur: NEGATIVE
Nitrite: NEGATIVE
Specific Gravity, Urine: 1.01 (ref 1.000–1.030)
Total Protein, Urine: NEGATIVE
Urine Glucose: NEGATIVE
Urobilinogen, UA: 0.2 (ref 0.0–1.0)
pH: 6.5 (ref 5.0–8.0)

## 2022-01-29 MED ORDER — CEPHALEXIN 500 MG PO CAPS: 500.0000 mg | ORAL_CAPSULE | Freq: Three times a day (TID) | ORAL | 0 refills | Status: DC

## 2022-01-29 MED ORDER — CYCLOBENZAPRINE HCL 5 MG PO TABS
5.0000 mg | ORAL_TABLET | Freq: Three times a day (TID) | ORAL | 1 refills | Status: DC | PRN
Start: 2022-01-29 — End: 2023-01-28

## 2022-01-29 MED ORDER — ALPRAZOLAM 1 MG PO TABS: ORAL_TABLET | ORAL | 5 refills | Status: DC

## 2022-01-29 NOTE — Progress Notes (Signed)
Patient ID: Molly Lewis, female   DOB: April 25, 1951, 71 y.o.   MRN: [id number]        Chief Complaint: follow up anxiety, lbp, and abnormal uriine color x 2 wks       HPI:  Molly Lewis is a 71 y.o. female here with c/o 2 wks gradually worsening lower back pain and spasm for no apparent aggrevating reason, with radiation to neck, somewhat heavy legs bilateral, and worsening urine color recently orange, with some increased frequency though has increase po intake as well  Denies urinary symptoms such as dysuria, frequency, urgency, other flank pain, hematuria or n/v, fever, chills.  Still worked yesterday at her part time job, but pain worse to turn over at [employer], and today about 8/10 when she is not working and is more aware of it.   Primarily here to f/o uti, since she knew an older man with similar symptoms with UTI. Cannot recall last UTI for herself.  Pt denies chest pain, increased sob or doe, wheezing, orthopnea, PND, increased LE swelling, palpitations, dizziness or syncope.   Pt denies polydipsia, polyuria, or new focal neuro s/s.   Denies worsening depressive symptoms, suicidal ideation, or panic, but needs xanax refill  Wt Readings from Last 3 Encounters:  01/29/22 178 lb (80.7 kg)  11/07/21 175 lb (79.4 kg)  10/23/21 175 lb (79.4 kg)   BP Readings from Last 3 Encounters:  01/29/22 130/74  11/07/21 140/68  10/23/21 (!) 166/80         Past Medical History:  Diagnosis Date   Allergic rhinitis, cause unspecified 07/18/2011   Allergy    ANXIETY 06/23/2007   BREAST BIOPSY, HX OF 06/23/2007   benign   Bursitis    Cataract    bilateral lens implants   Chronic back pain    Chronic constipation    "i have had it all my life and the last time i had a colonoscopy they tol me i have a distened colon because of it"    COLONIC POLYPS, HX OF 06/27/2008   COMMON MIGRAINE 06/23/2007   occasional   DEPRESSION 06/23/2007   Detached retina    GERD 06/23/2007   HYPERLIPIDEMIA 06/23/2007    Hypertension    Impaired glucose tolerance 02/16/2011   INSOMNIA-SLEEP DISORDER-UNSPEC 06/26/2009   Irritable bowel syndrome 06/23/2007   diet controlled   Microhematuria 07/31/2015   OSTEOARTHRITIS, LUMBAR SPINE 06/23/2007   OSTEOPENIA 07/11/2008   Pre-diabetes    Past Surgical History:  Procedure Laterality Date   APPENDECTOMY     BIOPSY  09/19/2020   Procedure: BIOPSY;  Surgeon: Irving Copas., MD;  Location: [facility];  Service: Gastroenterology;;   CATARACT EXTRACTION     BIL   colonoscopy  02/2019   COLONOSCOPY  09/2013, 07/2008, 08/2003, 07/1999   multiple   COLONOSCOPY WITH PROPOFOL N/A 03/29/2019   Procedure: COLONOSCOPY WITH PROPOFOL;  Surgeon: Irving Copas., MD;  Location: [facility];  Service: Gastroenterology;  Laterality: N/A;   COLONOSCOPY WITH PROPOFOL N/A 09/19/2020   Procedure: COLONOSCOPY WITH PROPOFOL;  Surgeon: Rush Landmark Telford Nab., MD;  Location: [facility];  Service: Gastroenterology;  Laterality: N/A;   ENDOSCOPIC MUCOSAL RESECTION N/A 03/29/2019   Procedure: ENDOSCOPIC MUCOSAL RESECTION;  Surgeon: Rush Landmark Telford Nab., MD;  Location: [facility];  Service: Gastroenterology;  Laterality: N/A;   HEMOSTASIS CLIP PLACEMENT  03/29/2019   Procedure: HEMOSTASIS CLIP PLACEMENT;  Surgeon: Rush Landmark Telford Nab., MD;  Location: [facility];  Service: Gastroenterology;;   POLYPECTOMY  09/19/2020   Procedure: POLYPECTOMY;  Surgeon: Irving Copas., MD;  Location: [place] [facility];  Service: Gastroenterology;;   REPLACEMENT TOTAL KNEE Right 04/2020   RETINAL DETACHMENT SURGERY Right 01/2019   RETINAL TEAR REPAIR CRYOTHERAPY Right    SUBMUCOSAL LIFTING INJECTION  03/29/2019   Procedure: SUBMUCOSAL LIFTING INJECTION;  Surgeon: Irving Copas., MD;  Location: [facility];  Service: Gastroenterology;;   TOTAL ABDOMINAL HYSTERECTOMY     UPPER GASTROINTESTINAL ENDOSCOPY  10/2003   WISDOM TOOTH EXTRACTION      reports that she quit  smoking about 12 years ago. Her smoking use included cigarettes. She has a 35.00 pack-year smoking history. She has never used smokeless tobacco. She reports that she does not currently use alcohol. She reports that she does not use drugs. family history includes Brain cancer in her maternal grandmother and mother; Heart disease in her father; Hypertension in her father. Allergies  Allergen Reactions   Codeine Nausea And Vomiting    Violent vomiting   Current Outpatient Medications on File Prior to Visit  Medication Sig Dispense Refill   aspirin EC 81 MG tablet Take 1 tablet (81 mg total) by mouth daily. Swallow whole. 90 tablet 3   atorvastatin (LIPITOR) 80 MG tablet Take 1 tablet (80 mg total) by mouth daily. 90 tablet 2   Bacillus Coagulans-Inulin (PROBIOTIC-PREBIOTIC PO) Take 1 capsule by mouth daily.     Calcium-Magnesium-Vitamin D (CALCIUM 1200+D3 PO) Take 1 tablet by mouth daily.     celecoxib (CELEBREX) 200 MG capsule TAKE 1 CAPSULE(200 MG) BY MOUTH TWICE DAILY AS NEEDED FOR MILD PAIN OR MODERATE PAIN 180 capsule 1   ezetimibe (ZETIA) 10 MG tablet Take 1 tablet (10 mg total) by mouth daily. 90 tablet 3   hydrochlorothiazide (MICROZIDE) 12.5 MG capsule Take 1 capsule (12.5 mg total) by mouth daily. 90 capsule 3   losartan (COZAAR) 100 MG tablet TAKE 1 TABLET(100 MG) BY MOUTH DAILY 90 tablet 1   metoprolol succinate (TOPROL-XL) 50 MG 24 hr tablet Take 1 tablet (50 mg total) by mouth daily. Take with or immediately following a meal. 90 tablet 3   metroNIDAZOLE (METROGEL) 0.75 % gel Apply 1 application topically 2 (two) times daily.   2   Multiple Vitamin (MULTIVITAMIN WITH MINERALS) TABS tablet Take 1 tablet by mouth daily.     omeprazole (PRILOSEC) 20 MG capsule TAKE 1 CAPSULE BY MOUTH TWICE DAILY 180 capsule 3   No current facility-administered medications on file prior to visit.        ROS:  All others reviewed and negative.  Objective        PE:  BP 130/74 (BP Location: Right  Arm, Patient Position: Sitting, Cuff Size: Large)   Pulse 62   Temp 98 F (36.7 C) (Oral)   Ht '5\' 5"'$  (1.651 m)   Wt 178 lb (80.7 kg)   LMP  (LMP Unknown)   SpO2 94%   BMI 29.62 kg/m                 Constitutional: Pt appears in NAD               HENT: Head: NCAT.                Right Ear: External ear normal.                 Left Ear: External ear normal.  Eyes: . Pupils are equal, round, and reactive to light. Conjunctivae and EOM are normal               Nose: without d/c or deformity               Neck: Neck supple. Gross normal ROM               Cardiovascular: Normal rate and regular rhythm.                 Pulmonary/Chest: Effort normal and breath sounds without rales or wheezing.                Abd:  Soft, NT, ND, + BS, no organomegaly               Spine - non tender in midline               Neurological: Pt is alert. At baseline orientation, motor grossly intact               Skin: Skin is warm. No rashes, no other new lesions, LE edema - none               Psychiatric: Pt behavior is normal without agitation   Micro: none  Cardiac tracings I have personally interpreted today:  none  Pertinent Radiological findings (summarize): none   Lab Results  Component Value Date   WBC 4.9 10/23/2021   HGB 13.5 10/23/2021   HCT 40.0 10/23/2021   PLT 160.0 10/23/2021   GLUCOSE 101 (H) 10/23/2021   CHOL 135 10/23/2021   TRIG 146.0 10/23/2021   HDL 53.10 10/23/2021   LDLDIRECT 90.0 10/15/2020   LDLCALC 53 10/23/2021   ALT 28 10/23/2021   AST 24 10/23/2021   NA 139 10/23/2021   K 3.6 10/23/2021   CL 102 10/23/2021   CREATININE 0.85 10/23/2021   BUN 17 10/23/2021   CO2 34 (H) 10/23/2021   TSH 0.88 10/23/2021   HGBA1C 6.1 10/23/2021   Assessment/Plan:  Molly Lewis is a 71 y.o. White or Caucasian [1] female with  has a past medical history of Allergic rhinitis, cause unspecified (07/18/2011), Allergy, ANXIETY (06/23/2007), BREAST BIOPSY, HX OF  (06/23/2007), Bursitis, Cataract, Chronic back pain, Chronic constipation, COLONIC POLYPS, HX OF (06/27/2008), COMMON MIGRAINE (06/23/2007), DEPRESSION (06/23/2007), Detached retina, GERD (06/23/2007), HYPERLIPIDEMIA (06/23/2007), Hypertension, Impaired glucose tolerance (02/16/2011), INSOMNIA-SLEEP DISORDER-UNSPEC (06/26/2009), Irritable bowel syndrome (06/23/2007), Microhematuria (07/31/2015), OSTEOARTHRITIS, LUMBAR SPINE (06/23/2007), OSTEOPENIA (07/11/2008), and Pre-diabetes.  LOW BACK PAIN Recent onset, etiology unclear, but suspect msk strain - for flexeril prn, and ls spine films  Anxiety state Overall stable, to continue the xanax prn  Abnormal urine color With frequency - for ua and culture,  to f/u any worsening symptoms or concerns  Hypertension BP Readings from Last 3 Encounters:  01/29/22 130/74  11/07/21 140/68  10/23/21 (!) 166/80   Stable, pt to continue medical treatment losartan, hct, toprol  Followup: Return if symptoms worsen or fail to improve.  Cathlean Cower, MD 02/01/2022 9:28 AM [facility]

## 2022-01-29 NOTE — Patient Instructions (Signed)
Please take all new medication as prescribed - the flexeril for muscle relaxer as needed  Please continue all other medications as before, and refills have been done if requested - the xanax as needed  Please have the pharmacy call with any other refills you may need.  Please continue your efforts at being more active, low cholesterol diet, and weight control.  Please keep your appointments with your specialists as you may have planned  Please go to the XRAY Department in the first floor for the x-ray testing  Please go to the LAB at the blood drawing area for the tests to be done - just the urine testing today  You will be contacted by phone if any changes need to be made immediately.  Otherwise, you will receive a letter about your results with an explanation, but please check with MyChart first.  Please remember to sign up for MyChart if you have not done so, as this will be important to you in the future with finding out test results, communicating by private email, and scheduling acute appointments online when needed.

## 2022-01-30 LAB — URINE CULTURE

## 2022-02-01 ENCOUNTER — Encounter: Payer: Self-pay | Admitting: Internal Medicine

## 2022-02-01 NOTE — Assessment & Plan Note (Signed)
With frequency - for ua and culture,  to f/u any worsening symptoms or concerns

## 2022-02-01 NOTE — Assessment & Plan Note (Signed)
BP Readings from Last 3 Encounters:  01/29/22 130/74  11/07/21 140/68  10/23/21 (!) 166/80   Stable, pt to continue medical treatment losartan, hct, toprol

## 2022-02-01 NOTE — Assessment & Plan Note (Signed)
Recent onset, etiology unclear, but suspect msk strain - for flexeril prn, and ls spine films

## 2022-02-01 NOTE — Assessment & Plan Note (Signed)
Overall stable, to continue the xanax prn

## 2022-02-12 ENCOUNTER — Other Ambulatory Visit: Payer: Self-pay | Admitting: Internal Medicine

## 2022-02-26 DIAGNOSIS — M545 Low back pain, unspecified: Secondary | ICD-10-CM | POA: Diagnosis not present

## 2022-03-05 DIAGNOSIS — M545 Low back pain, unspecified: Secondary | ICD-10-CM | POA: Diagnosis not present

## 2022-03-05 DIAGNOSIS — M47896 Other spondylosis, lumbar region: Secondary | ICD-10-CM | POA: Diagnosis not present

## 2022-03-26 ENCOUNTER — Telehealth: Payer: Self-pay | Admitting: Internal Medicine

## 2022-03-26 MED ORDER — ATORVASTATIN CALCIUM 80 MG PO TABS: 80.0000 mg | ORAL_TABLET | Freq: Every day | ORAL | 2 refills | Status: DC

## 2022-03-26 NOTE — Telephone Encounter (Signed)
Caller & Relationship to patient:self   Call back [phone]   Date of last office visit:01/29/22   Date of next office visit:   Medication(s) to be refilled:atorvastatin (LIPITOR) 80 MG tablet         Preferred Pharmacy:  [facility], [place] - [address] AT [address] Phone:  [phone]  Fax:  [phone]

## 2022-03-27 NOTE — Telephone Encounter (Signed)
Refilled done on 03/26/22.Marland KitchenJohny Lewis

## 2022-04-02 DIAGNOSIS — M545 Low back pain, unspecified: Secondary | ICD-10-CM | POA: Diagnosis not present

## 2022-04-02 DIAGNOSIS — Z5181 Encounter for therapeutic drug level monitoring: Secondary | ICD-10-CM | POA: Diagnosis not present

## 2022-04-23 ENCOUNTER — Ambulatory Visit: Payer: 59 | Admitting: Internal Medicine

## 2022-05-05 ENCOUNTER — Encounter: Payer: Self-pay | Admitting: Internal Medicine

## 2022-05-05 ENCOUNTER — Ambulatory Visit (INDEPENDENT_AMBULATORY_CARE_PROVIDER_SITE_OTHER): Payer: Medicare Other | Admitting: Internal Medicine

## 2022-05-05 VITALS — BP 138/80 | HR 67 | Temp 98.0°F | Ht 65.0 in | Wt 177.0 lb

## 2022-05-05 DIAGNOSIS — I10 Essential (primary) hypertension: Secondary | ICD-10-CM | POA: Diagnosis not present

## 2022-05-05 DIAGNOSIS — G8929 Other chronic pain: Secondary | ICD-10-CM | POA: Diagnosis not present

## 2022-05-05 DIAGNOSIS — I2510 Atherosclerotic heart disease of native coronary artery without angina pectoris: Secondary | ICD-10-CM

## 2022-05-05 DIAGNOSIS — I2584 Coronary atherosclerosis due to calcified coronary lesion: Secondary | ICD-10-CM

## 2022-05-05 DIAGNOSIS — M25561 Pain in right knee: Secondary | ICD-10-CM

## 2022-05-05 DIAGNOSIS — F411 Generalized anxiety disorder: Secondary | ICD-10-CM

## 2022-05-05 DIAGNOSIS — M545 Low back pain, unspecified: Secondary | ICD-10-CM | POA: Diagnosis not present

## 2022-05-05 DIAGNOSIS — M25562 Pain in left knee: Secondary | ICD-10-CM | POA: Diagnosis not present

## 2022-05-05 NOTE — Patient Instructions (Addendum)
Please have your Shingrix (shingles) shots done at your local pharmacy.  Please continue all other medications as before, and refills have been done if requested.  Please have the pharmacy call with any other refills you may need.  Please continue your efforts at being more active, low cholesterol diet, and weight control.  Please keep your appointments with your specialists as you may have planned  Please make an Appointment to return in 6 months, or sooner if needed

## 2022-05-05 NOTE — Progress Notes (Unsigned)
Patient ID: Molly Lewis, female   DOB: 07-17-1951, 71 y.o.   MRN: [id number]        Chief Complaint: follow up excess sweating and wt gain in the setting of chronic LBP and right knee arthritis       HPI:  Molly Lewis is a 71 y.o. female here excess sweating and wt gain in the setting of chronic LBP and right knee arthritis, frustrated as she used to be quite active and kept her wt down.  Pt denies chest pain, increased sob or doe, wheezing, orthopnea, PND, increased LE swelling, palpitations, dizziness or syncope.   Pt denies polydipsia, polyuria, or new focal neuro s/s.  Pt denies fever, wt loss, loss of appetite, or other constitutional symptoms  Denies worsening depressive symptoms, suicidal ideation, or panic; has ongoing anxiety, mild increased recently       Wt Readings from Last 3 Encounters:  05/05/22 177 lb (80.3 kg)  01/29/22 178 lb (80.7 kg)  11/07/21 175 lb (79.4 kg)   BP Readings from Last 3 Encounters:  05/05/22 138/80  01/29/22 130/74  11/07/21 140/68         Past Medical History:  Diagnosis Date   Allergic rhinitis, cause unspecified 07/18/2011   Allergy    ANXIETY 06/23/2007   BREAST BIOPSY, HX OF 06/23/2007   benign   Bursitis    Cataract    bilateral lens implants   Chronic back pain    Chronic constipation    "i have had it all my life and the last time i had a colonoscopy they tol me i have a distened colon because of it"    COLONIC POLYPS, HX OF 06/27/2008   COMMON MIGRAINE 06/23/2007   occasional   DEPRESSION 06/23/2007   Detached retina    GERD 06/23/2007   HYPERLIPIDEMIA 06/23/2007   Hypertension    Impaired glucose tolerance 02/16/2011   INSOMNIA-SLEEP DISORDER-UNSPEC 06/26/2009   Irritable bowel syndrome 06/23/2007   diet controlled   Microhematuria 07/31/2015   OSTEOARTHRITIS, LUMBAR SPINE 06/23/2007   OSTEOPENIA 07/11/2008   Pre-diabetes    Past Surgical History:  Procedure Laterality Date   APPENDECTOMY     BIOPSY  09/19/2020    Procedure: BIOPSY;  Surgeon: Irving Copas., MD;  Location: [facility];  Service: Gastroenterology;;   CATARACT EXTRACTION     BIL   colonoscopy  02/2019   COLONOSCOPY  09/2013, 07/2008, 08/2003, 07/1999   multiple   COLONOSCOPY WITH PROPOFOL N/A 03/29/2019   Procedure: COLONOSCOPY WITH PROPOFOL;  Surgeon: Irving Copas., MD;  Location: [facility];  Service: Gastroenterology;  Laterality: N/A;   COLONOSCOPY WITH PROPOFOL N/A 09/19/2020   Procedure: COLONOSCOPY WITH PROPOFOL;  Surgeon: Rush Landmark Telford Nab., MD;  Location: [facility];  Service: Gastroenterology;  Laterality: N/A;   ENDOSCOPIC MUCOSAL RESECTION N/A 03/29/2019   Procedure: ENDOSCOPIC MUCOSAL RESECTION;  Surgeon: Rush Landmark Telford Nab., MD;  Location: [facility];  Service: Gastroenterology;  Laterality: N/A;   HEMOSTASIS CLIP PLACEMENT  03/29/2019   Procedure: HEMOSTASIS CLIP PLACEMENT;  Surgeon: Irving Copas., MD;  Location: [facility];  Service: Gastroenterology;;   POLYPECTOMY  09/19/2020   Procedure: POLYPECTOMY;  Surgeon: Irving Copas., MD;  Location: [facility];  Service: Gastroenterology;;   REPLACEMENT TOTAL KNEE Right 04/2020   RETINAL DETACHMENT SURGERY Right 01/2019   RETINAL TEAR REPAIR CRYOTHERAPY Right    SUBMUCOSAL LIFTING INJECTION  03/29/2019   Procedure: SUBMUCOSAL LIFTING INJECTION;  Surgeon: Irving Copas., MD;  Location: [facility];  Service: Gastroenterology;;   TOTAL ABDOMINAL HYSTERECTOMY     UPPER GASTROINTESTINAL ENDOSCOPY  10/2003   WISDOM TOOTH EXTRACTION      reports that she quit smoking about 13 years ago. Her smoking use included cigarettes. She has a 35.00 pack-year smoking history. She has never used smokeless tobacco. She reports that she does not currently use alcohol. She reports that she does not use drugs. family history includes Brain cancer in her maternal grandmother and mother; Heart disease in her father; Hypertension in her  father. Allergies  Allergen Reactions   Codeine Nausea And Vomiting    Violent vomiting   Current Outpatient Medications on File Prior to Visit  Medication Sig Dispense Refill   ALPRAZolam (XANAX) 1 MG tablet 1 tab by mouth twice per day as needed 60 tablet 5   aspirin EC 81 MG tablet Take 1 tablet (81 mg total) by mouth daily. Swallow whole. 90 tablet 3   atorvastatin (LIPITOR) 80 MG tablet Take 1 tablet (80 mg total) by mouth daily. 90 tablet 2   Bacillus Coagulans-Inulin (PROBIOTIC-PREBIOTIC PO) Take 1 capsule by mouth daily.     Calcium-Magnesium-Vitamin D (CALCIUM 1200+D3 PO) Take 1 tablet by mouth daily.     celecoxib (CELEBREX) 200 MG capsule TAKE 1 CAPSULE(200 MG) BY MOUTH TWICE DAILY AS NEEDED FOR MILD PAIN OR MODERATE PAIN 180 capsule 1   cephALEXin (KEFLEX) 500 MG capsule Take 1 capsule (500 mg total) by mouth 3 (three) times daily. 30 capsule 0   cyclobenzaprine (FLEXERIL) 5 MG tablet Take 1 tablet (5 mg total) by mouth 3 (three) times daily as needed for muscle spasms. 40 tablet 1   ezetimibe (ZETIA) 10 MG tablet TAKE 1 TABLET(10 MG) BY MOUTH DAILY 90 tablet 2   hydrochlorothiazide (MICROZIDE) 12.5 MG capsule Take 1 capsule (12.5 mg total) by mouth daily. 90 capsule 3   losartan (COZAAR) 100 MG tablet TAKE 1 TABLET(100 MG) BY MOUTH DAILY 90 tablet 1   metoprolol succinate (TOPROL-XL) 50 MG 24 hr tablet Take 1 tablet (50 mg total) by mouth daily. Take with or immediately following a meal. 90 tablet 3   metroNIDAZOLE (METROGEL) 0.75 % gel Apply 1 application topically 2 (two) times daily.   2   Multiple Vitamin (MULTIVITAMIN WITH MINERALS) TABS tablet Take 1 tablet by mouth daily.     omeprazole (PRILOSEC) 20 MG capsule TAKE 1 CAPSULE BY MOUTH TWICE DAILY 180 capsule 3   No current facility-administered medications on file prior to visit.        ROS:  All others reviewed and negative.  Objective        PE:  BP 138/80 (BP Location: Right Arm, Patient Position: Sitting, Cuff  Size: Large)   Pulse 67   Temp 98 F (36.7 C) (Oral)   Ht '5\' 5"'$  (1.651 m)   Wt 177 lb (80.3 kg)   LMP  (LMP Unknown)   SpO2 90%   BMI 29.45 kg/m                 Constitutional: Pt appears in NAD               HENT: Head: NCAT.                Right Ear: External ear normal.                 Left Ear: External ear normal.  Eyes: . Pupils are equal, round, and reactive to light. Conjunctivae and EOM are normal               Nose: without d/c or deformity               Neck: Neck supple. Gross normal ROM               Cardiovascular: Normal rate and regular rhythm.                 Pulmonary/Chest: Effort normal and breath sounds without rales or wheezing.                Abd:  Soft, NT, ND, + BS, no organomegaly               Neurological: Pt is alert. At baseline orientation, motor grossly intact               Skin: Skin is warm. No rashes, no other new lesions, LE edema - none, right knee with marked degenerative change               Psychiatric: Pt behavior is normal without agitation   Micro: none  Cardiac tracings I have personally interpreted today:  none  Pertinent Radiological findings (summarize): none   Lab Results  Component Value Date   WBC 4.9 10/23/2021   HGB 13.5 10/23/2021   HCT 40.0 10/23/2021   PLT 160.0 10/23/2021   GLUCOSE 101 (H) 10/23/2021   CHOL 135 10/23/2021   TRIG 146.0 10/23/2021   HDL 53.10 10/23/2021   LDLDIRECT 90.0 10/15/2020   LDLCALC 53 10/23/2021   ALT 28 10/23/2021   AST 24 10/23/2021   NA 139 10/23/2021   K 3.6 10/23/2021   CL 102 10/23/2021   CREATININE 0.85 10/23/2021   BUN 17 10/23/2021   CO2 34 (H) 10/23/2021   TSH 0.88 10/23/2021   HGBA1C 6.1 10/23/2021   Assessment/Plan:  Molly Lewis is a 71 y.o. White or Caucasian [1] female with  has a past medical history of Allergic rhinitis, cause unspecified (07/18/2011), Allergy, ANXIETY (06/23/2007), BREAST BIOPSY, HX OF (06/23/2007), Bursitis, Cataract, Chronic back  pain, Chronic constipation, COLONIC POLYPS, HX OF (06/27/2008), COMMON MIGRAINE (06/23/2007), DEPRESSION (06/23/2007), Detached retina, GERD (06/23/2007), HYPERLIPIDEMIA (06/23/2007), Hypertension, Impaired glucose tolerance (02/16/2011), INSOMNIA-SLEEP DISORDER-UNSPEC (06/26/2009), Irritable bowel syndrome (06/23/2007), Microhematuria (07/31/2015), OSTEOARTHRITIS, LUMBAR SPINE (06/23/2007), OSTEOPENIA (07/11/2008), and Pre-diabetes.  LOW BACK PAIN Chronic stable but exercise limiting, for pain control, does not appear to need PT referral, cont to follow  Bilateral knee pain With right knee now much more than left recently, to f/u sport med as is exercise limiting, for pain control, does not appear to need PT referral, cont to follow   Hypertension BP Readings from Last 3 Encounters:  05/05/22 138/80  01/29/22 130/74  11/07/21 140/68   Stable, pt to continue medical treatment hct 12.5 qd, losartan 100 qd, toprol xl 50 qd   Anxiety state Mild uncontrolled, pt to restart xanax prn use, decliens ssri use for now,  to f/u any worsening symptoms or concerns  Followup: No follow-ups on file.  Cathlean Cower, MD 05/06/2022 4:15 PM [facility]

## 2022-05-06 NOTE — Assessment & Plan Note (Signed)
With right knee now much more than left recently, to f/u sport med as is exercise limiting, for pain control, does not appear to need PT referral, cont to follow

## 2022-05-06 NOTE — Assessment & Plan Note (Addendum)
BP Readings from Last 3 Encounters:  05/05/22 138/80  01/29/22 130/74  11/07/21 140/68   Stable, pt to continue medical treatment hct 12.5 qd, losartan 100 qd, toprol xl 50 qd

## 2022-05-06 NOTE — Assessment & Plan Note (Signed)
Chronic stable but exercise limiting, for pain control, does not appear to need PT referral, cont to follow

## 2022-05-06 NOTE — Assessment & Plan Note (Addendum)
Mild uncontrolled, pt to restart xanax prn use, decliens ssri use for now,  to f/u any worsening symptoms or concerns

## 2022-05-08 ENCOUNTER — Telehealth: Payer: Self-pay

## 2022-05-08 NOTE — Telephone Encounter (Signed)
ATC pt to get an update on if pt has had her Mammogram done or if she needs someone to assist in setting this appointment up.

## 2022-05-14 DIAGNOSIS — Z0142 Encounter for cervical smear to confirm findings of recent normal smear following initial abnormal smear: Secondary | ICD-10-CM | POA: Diagnosis not present

## 2022-05-14 DIAGNOSIS — N952 Postmenopausal atrophic vaginitis: Secondary | ICD-10-CM | POA: Diagnosis not present

## 2022-05-14 DIAGNOSIS — Z6829 Body mass index (BMI) 29.0-29.9, adult: Secondary | ICD-10-CM | POA: Diagnosis not present

## 2022-05-14 DIAGNOSIS — Z124 Encounter for screening for malignant neoplasm of cervix: Secondary | ICD-10-CM | POA: Diagnosis not present

## 2022-05-14 DIAGNOSIS — Z01419 Encounter for gynecological examination (general) (routine) without abnormal findings: Secondary | ICD-10-CM | POA: Diagnosis not present

## 2022-05-14 DIAGNOSIS — Z1231 Encounter for screening mammogram for malignant neoplasm of breast: Secondary | ICD-10-CM | POA: Diagnosis not present

## 2022-05-14 DIAGNOSIS — Z01411 Encounter for gynecological examination (general) (routine) with abnormal findings: Secondary | ICD-10-CM | POA: Diagnosis not present

## 2022-05-14 LAB — HM MAMMOGRAPHY

## 2022-05-18 ENCOUNTER — Encounter: Payer: Self-pay | Admitting: Internal Medicine

## 2022-05-26 DIAGNOSIS — Z79899 Other long term (current) drug therapy: Secondary | ICD-10-CM | POA: Diagnosis not present

## 2022-05-26 DIAGNOSIS — M545 Low back pain, unspecified: Secondary | ICD-10-CM | POA: Diagnosis not present

## 2022-05-26 DIAGNOSIS — Z5181 Encounter for therapeutic drug level monitoring: Secondary | ICD-10-CM | POA: Diagnosis not present

## 2022-05-26 DIAGNOSIS — M47816 Spondylosis without myelopathy or radiculopathy, lumbar region: Secondary | ICD-10-CM | POA: Diagnosis not present

## 2022-05-30 IMAGING — CT CT CARDIAC CORONARY ARTERY CALCIUM SCORE
3 series · 14 of 20 positions shown, 15 images · non-contrast
Comparison: None.
COMPARISON: None.

Addendum:
EXAM:
OVER-READ INTERPRETATION  CT CHEST

The following report is an over-read performed by radiologist Dr.
over-read does not include interpretation of cardiac or coronary
anatomy or pathology. The coronary calcium score interpretation by
the cardiologist is attached.
CLINICAL DATA: Cardiovascular Disease Risk stratification
Coronary Calcium Score
TECHNIQUE: A gated, non-contrast computed tomography scan of the heart was
performed using 3mm slice thickness. Axial images were analyzed on a
dedicated workstation. Calcium scoring of the coronary arteries was
performed using the Agatston method.

[Series 2: casc 3.0 bv41 2 bestdiast 70 % · axial · 0.38mm/px · z∈[-232,-148]mm · 4 of 48 slices shown, 5 images]
[im 10/48  vessel]
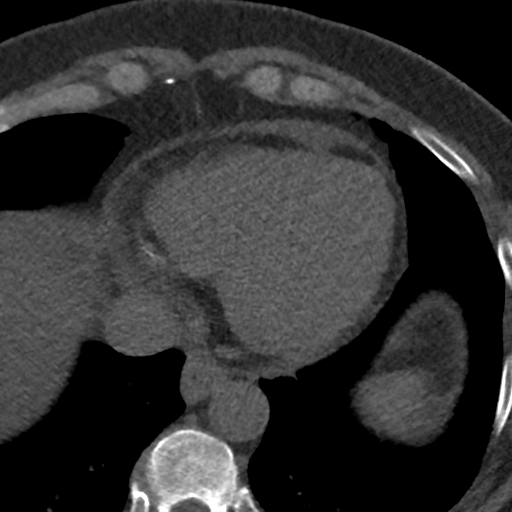
[im 10/48  lung]
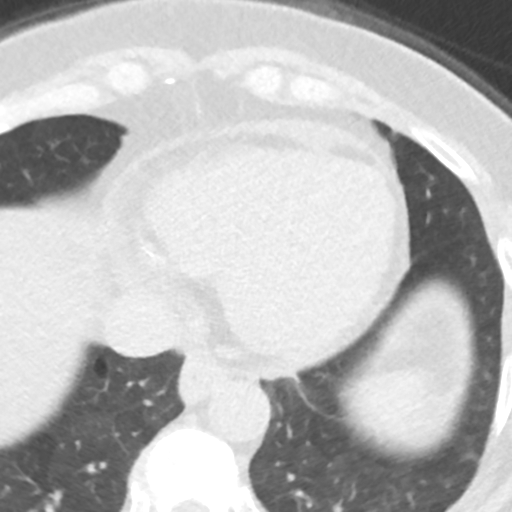
[im 19/48  vessel]
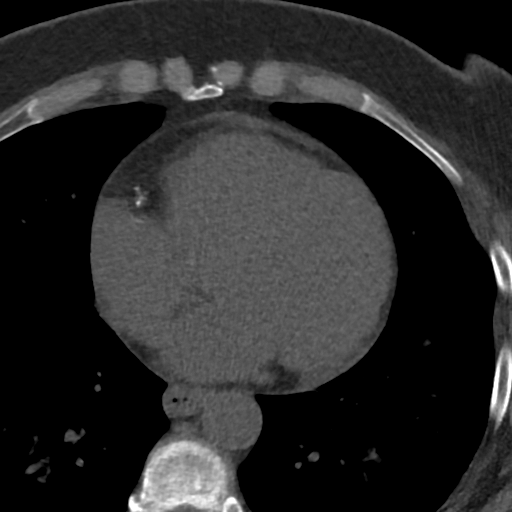
[im 29/48  vessel]
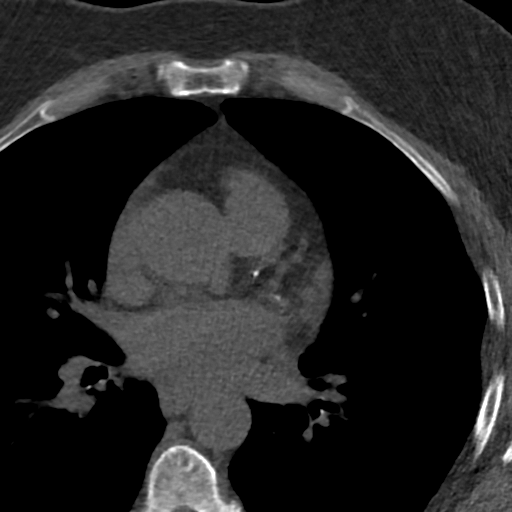
[im 38/48  vessel]
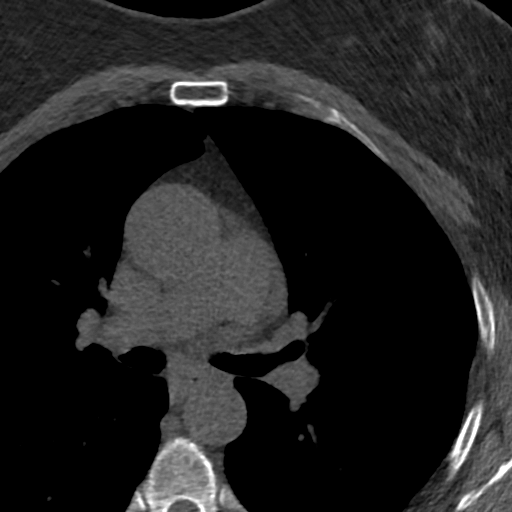

[Series 3: lung 69 % · axial · 0.67mm/px · z∈[-238,-142]mm · 5 of 48 slices shown]
[im 8/48  lung]
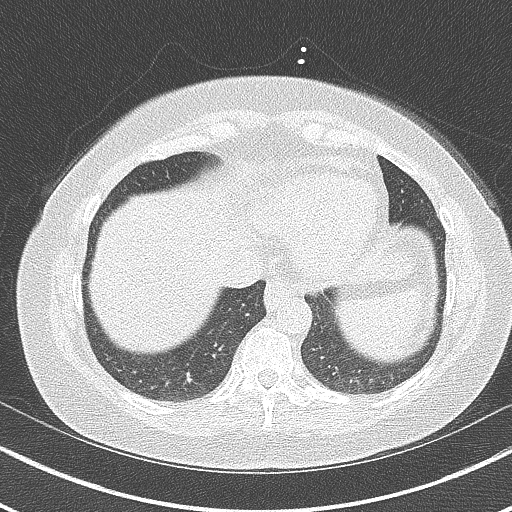
[im 16/48  lung]
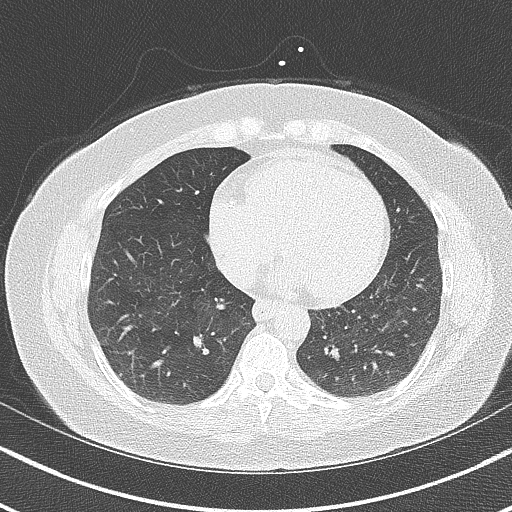
[im 24/48  lung]
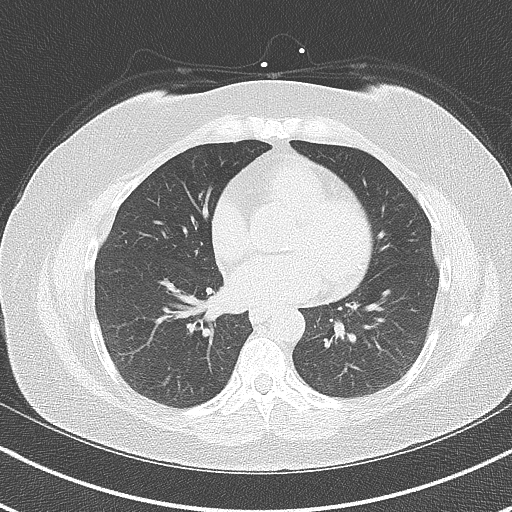
[im 32/48  lung]
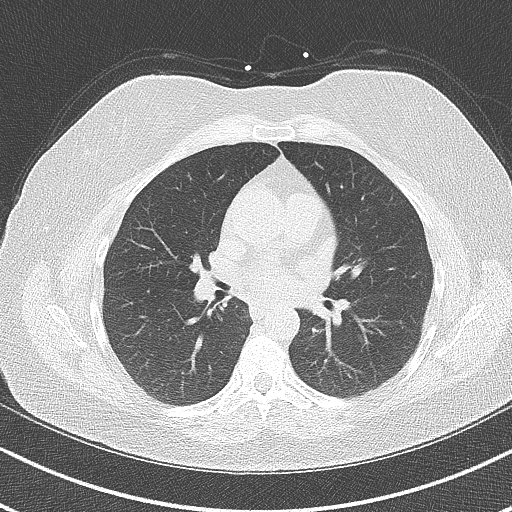
[im 40/48  lung]
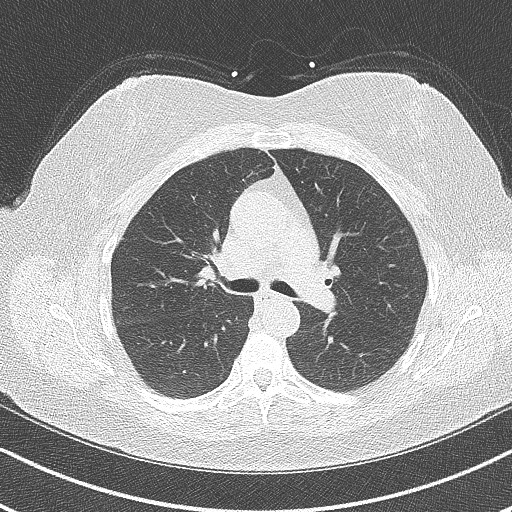

[Series 4: lung st 69 % · axial · 0.67mm/px · z∈[-238,-142]mm · 5 of 48 slices shown]
[im 8/48  lung]
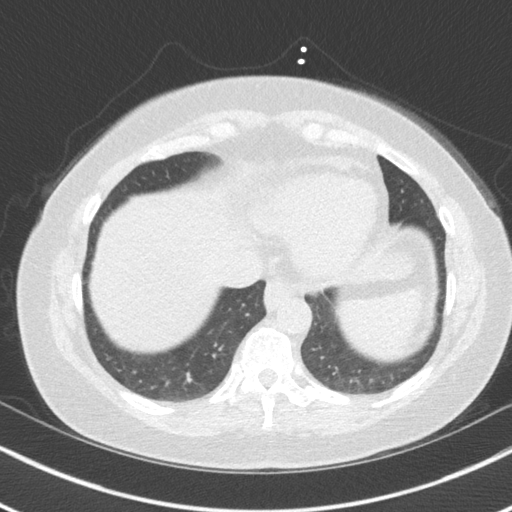
[im 16/48  lung]
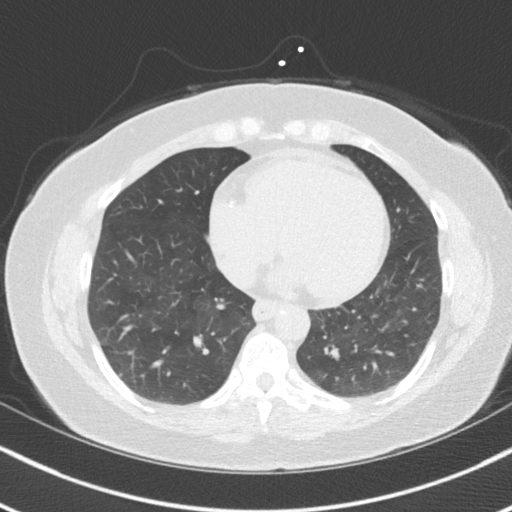
[im 24/48  lung]
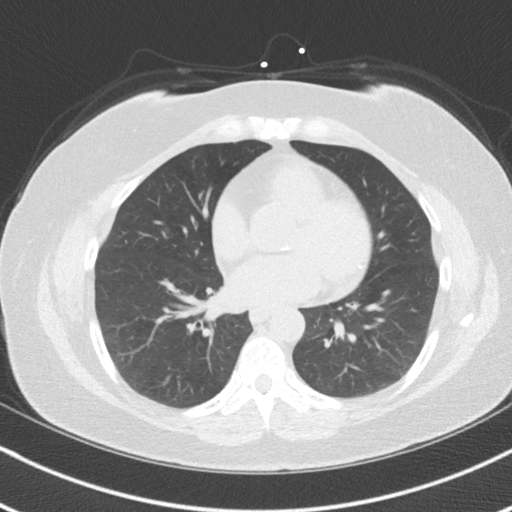
[im 32/48  lung]
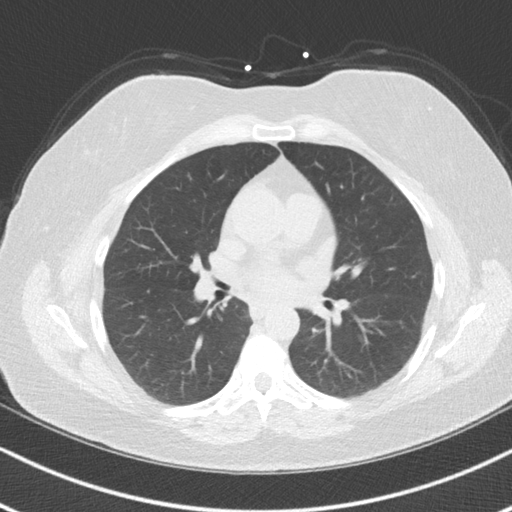
[im 40/48  lung]
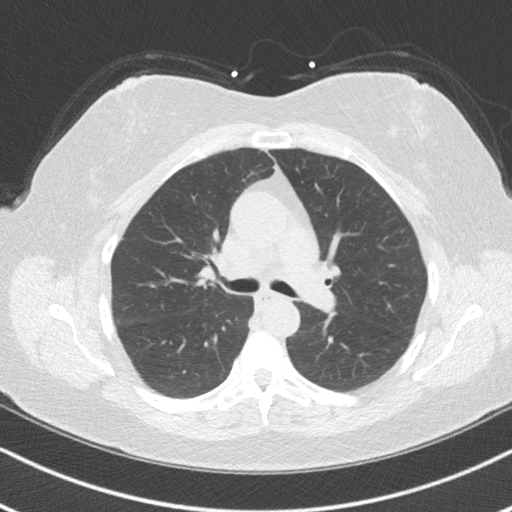

[14 of 20 positions shown; findings below may reference images not displayed]

FINDINGS: Vascular: Calcified plaque is visualized in the thoracic aorta.
Trace pericardial fluid.

Mediastinum/Nodes: Visualized mediastinum and hilar regions
demonstrate no lymphadenopathy or masses.

Lungs/Pleura: Lungs demonstrate some scattered tiny subpleural
pulmonary nodules bilaterally. The most discrete are 2 and 3 mm left
upper lobe nodules and a 2 mm right lower lobe nodule. There also is
suggestion of some scattered even smaller vague tiny subpleural
nodules elsewhere, including the left lower lobe. All of these small
subpleural nodules have the appearance of likely postinflammatory
nodules. Visualized lungs show no evidence of pulmonary edema,
consolidation, pneumothorax or pleural fluid.

Upper Abdomen: No acute abnormality.

Musculoskeletal: No chest wall mass or suspicious bone lesions
identified.
IMPRESSION: 1. Scattered tiny subpleural pulmonary nodules bilaterally. All of
these nodules have the appearance of likely postinflammatory
nodules. No follow-up needed if patient is low-risk (and has no
known or suspected primary neoplasm). Non-contrast chest CT can be
considered in 12 months if patient is high-risk. This recommendation
follows the consensus statement: Guidelines for Management of
Incidental Pulmonary Nodules Detected on CT Images: From the
2. Calcified plaque in the thoracic aorta.
3. Trace pericardial fluid.

Aortic Atherosclerosis (IGU3Z-Q6R.R).
FINDINGS: Coronary arteries: Normal origins.

Coronary Calcium Score:

Left main: 0

Left anterior descending artery:

Left circumflex artery:

Right coronary artery: 144

Total: 246

Percentile: 88 th

Pericardium: Normal.

Ascending Aorta: Normal caliber.  3.7 cm

Non-cardiac: See separate report from [REDACTED].
IMPRESSION: Coronary calcium score of 246. This was 88 th percentile for age-,
race-, and sex-matched controls.



If CAC=0, it is reasonable to withhold statin therapy and reassess
in 5 to 10 years, as long as higher risk conditions are absent
(diabetes mellitus, family history of premature CHD in first degree
relatives (males <55 years; females <65 years), cigarette smoking,
or LDL >=190 mg/dL).

If CAC is 1 to 99, it is reasonable to initiate statin therapy for
patients >=55 years of age.

If CAC is >=100 or >=75th percentile, it is reasonable to initiate
statin therapy at any age.

Cardiology referral should be considered for patients with CAC
scores >=400 or >=75th percentile.

*1142 AHA/ACC/AACVPR/AAPA/ABC/YUNGVAR/NAKENG/TOOR/Macyszyn/VENTO/OVI/MATHES
Guideline on the Management of Blood Cholesterol: A Report of the
American College of Cardiology/American Heart Association Task Force
on Clinical Practice Guidelines. J Am Coll Cardiol.
3951;73(24):9922-9875.

*** End of Addendum ***
EXAM:
OVER-READ INTERPRETATION  CT CHEST

The following report is an over-read performed by radiologist Dr.
over-read does not include interpretation of cardiac or coronary
anatomy or pathology. The coronary calcium score interpretation by
the cardiologist is attached.
FINDINGS: Vascular: Calcified plaque is visualized in the thoracic aorta.
Trace pericardial fluid.

Mediastinum/Nodes: Visualized mediastinum and hilar regions
demonstrate no lymphadenopathy or masses.

Lungs/Pleura: Lungs demonstrate some scattered tiny subpleural
pulmonary nodules bilaterally. The most discrete are 2 and 3 mm left
upper lobe nodules and a 2 mm right lower lobe nodule. There also is
suggestion of some scattered even smaller vague tiny subpleural
nodules elsewhere, including the left lower lobe. All of these small
subpleural nodules have the appearance of likely postinflammatory
nodules. Visualized lungs show no evidence of pulmonary edema,
consolidation, pneumothorax or pleural fluid.

Upper Abdomen: No acute abnormality.

Musculoskeletal: No chest wall mass or suspicious bone lesions
identified.
IMPRESSION: 1. Scattered tiny subpleural pulmonary nodules bilaterally. All of
these nodules have the appearance of likely postinflammatory
nodules. No follow-up needed if patient is low-risk (and has no
known or suspected primary neoplasm). Non-contrast chest CT can be
considered in 12 months if patient is high-risk. This recommendation
follows the consensus statement: Guidelines for Management of
Incidental Pulmonary Nodules Detected on CT Images: From the
2. Calcified plaque in the thoracic aorta.
3. Trace pericardial fluid.

Aortic Atherosclerosis (IGU3Z-Q6R.R).

## 2022-06-02 ENCOUNTER — Other Ambulatory Visit: Payer: Self-pay | Admitting: Internal Medicine

## 2022-06-02 NOTE — Progress Notes (Signed)
Cardiology Office Note:    Date:  06/09/2022   ID:  Molly, Lewis 1951-08-23, MRN [id number]  PCP:  Biagio Borg, MD  [facility] Providers Cardiologist:  Werner Lean, MD     Referring MD: Biagio Borg, MD   Chief Complaint:  No chief complaint on file.     History of Present Illness:   Molly Lewis is a 71 y.o. female with  a hx of HTN, HLD, CAC, and Aortic Atherosclerosis  seen in 2022.  Had negative stress test, PCP started her on metoprolol for BP and anxiety .  Patient saw Dr. Gasper Sells 11/2021 and was stable.  Patient comes in for f/u. Denies chest pain, dyspnea, dizziness or presyncope. 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 unable to exercise with back and knee issues.    Past Medical History:  Diagnosis Date   Allergic rhinitis, cause unspecified 07/18/2011   Allergy    ANXIETY 06/23/2007   BREAST BIOPSY, HX OF 06/23/2007   benign   Bursitis    Cataract    bilateral lens implants   Chronic back pain    Chronic constipation    "i have had it all my life and the last time i had a colonoscopy they tol me i have a distened colon because of it"    COLONIC POLYPS, HX OF 06/27/2008   COMMON MIGRAINE 06/23/2007   occasional   DEPRESSION 06/23/2007   Detached retina    GERD 06/23/2007   HYPERLIPIDEMIA 06/23/2007   Hypertension    Impaired glucose tolerance 02/16/2011   INSOMNIA-SLEEP DISORDER-UNSPEC 06/26/2009   Irritable bowel syndrome 06/23/2007   diet controlled   Microhematuria 07/31/2015   OSTEOARTHRITIS, LUMBAR SPINE 06/23/2007   OSTEOPENIA 07/11/2008   Pre-diabetes    Current Medications: Current Meds  Medication Sig   ALPRAZolam (XANAX) 1 MG tablet 1 tab by mouth twice per day as needed   aspirin EC 81 MG tablet Take 1 tablet (81 mg total) by mouth daily. Swallow whole.   atorvastatin (LIPITOR) 80 MG tablet Take 1 tablet (80 mg total) by mouth daily.   Bacillus  Coagulans-Inulin (PROBIOTIC-PREBIOTIC PO) Take 1 capsule by mouth daily.   Calcium-Magnesium-Vitamin D (CALCIUM 1200+D3 PO) Take 1 tablet by mouth daily.   celecoxib (CELEBREX) 200 MG capsule TAKE 1 CAPSULE(200 MG) BY MOUTH TWICE DAILY AS NEEDED FOR MILD PAIN OR MODERATE PAIN   cyclobenzaprine (FLEXERIL) 5 MG tablet Take 1 tablet (5 mg total) by mouth 3 (three) times daily as needed for muscle spasms.   ezetimibe (ZETIA) 10 MG tablet TAKE 1 TABLET(10 MG) BY MOUTH DAILY   hydrochlorothiazide (MICROZIDE) 12.5 MG capsule TAKE 1 CAPSULE(12.5 MG) BY MOUTH DAILY   losartan (COZAAR) 100 MG tablet TAKE 1 TABLET(100 MG) BY MOUTH DAILY   metoprolol succinate (TOPROL-XL) 50 MG 24 hr tablet Take 1 tablet (50 mg total) by mouth daily. Take with or immediately following a meal.   metroNIDAZOLE (METROGEL) 0.75 % gel Apply 1 application topically 2 (two) times daily.    Multiple Vitamin (MULTIVITAMIN WITH MINERALS) TABS tablet Take 1 tablet by mouth daily.   omeprazole (PRILOSEC) 20 MG capsule TAKE 1 CAPSULE BY MOUTH TWICE DAILY    Allergies:   Codeine   Social History   Tobacco Use   Smoking status: Former    Packs/day: 1.00    Years: 35.00    Total pack years: 35.00    Types:  Cigarettes    Quit date: 04/16/2009    Years since quitting: 13.1   Smokeless tobacco: Never   Tobacco comments:    Quit smoking 04/15/09  Vaping Use   Vaping Use: Never used  Substance Use Topics   Alcohol use: Not Currently    Comment: occasional   Drug use: No    Family Hx: The patient's family history includes Brain cancer in her maternal grandmother and mother; Heart disease in her father; Hypertension in her father. There is no history of Colon cancer, Esophageal cancer, Rectal cancer, or Stomach cancer.  ROS   EKGs/Labs/Other Test Reviewed:    EKG:  EKG is   ordered today.  The ekg ordered today demonstrates NSR with poor R wave progression anteriorly unchanged.  Recent Labs: 10/23/2021: ALT 28; BUN 17;  Creatinine, Ser 0.85; Hemoglobin 13.5; Platelets 160.0; Potassium 3.6; Sodium 139; TSH 0.88   Recent Lipid Panel Recent Labs    10/23/21 1035  CHOL 135  TRIG 146.0  HDL 53.10  VLDL 29.2  LDLCALC 53     Prior CV Studies:    CAC: Date: 12/10/20 Results: AC 246 1. Scattered tiny subpleural pulmonary nodules bilaterally. All of these nodules have the appearance of likely postinflammatory nodules. No follow-up needed if patient is low-risk (and has no known or suspected primary neoplasm). Non-contrast chest CT can be considered in 12 months if patient is high-risk. This recommendation follows the consensus statement: Guidelines for Management of Incidental Pulmonary Nodules Detected on CT Images: From the Fleischner Society 2017; Radiology 2017; 284:228-243. 2. Calcified plaque in the thoracic aorta. 3. Trace pericardial fluid.   Aortic Atherosclerosis   ECG or NM Stress Testing : Date: 02/24/21 Results: Addendum by Thayer Headings, MD on Mon Feb 24, 2021  3:15 PM Nuclear stress EF: 65%. The left ventricular ejection fraction is normal (55-65%). This is a low risk study. There is no evidence of ischemia and no evidence of previous infarction The study is normal.   Finalized by Thayer Headings, MD on Mon Feb 24, 2021  3:12 PM The left ventricular ejection fraction is normal (55-65%). Nuclear stress EF: 65%.        Risk Assessment/Calculations/Metrics:              Physical Exam:    VS:  BP 130/70   Pulse 63   Ht '5\' 5"'$  (1.651 m)   Wt 174 lb 4 oz (79 kg)   LMP  (LMP Unknown)   SpO2 92%   BMI 29.00 kg/m     Wt Readings from Last 3 Encounters:  06/09/22 174 lb 4 oz (79 kg)  05/05/22 177 lb (80.3 kg)  01/29/22 178 lb (80.7 kg)    Physical Exam  GEN: Well nourished, well developed, in no acute distress  Neck: bilateral carotid bruits, no JVD,or masses Cardiac:RRR; no murmurs, rubs, or gallops  Respiratory:  clear to auscultation bilaterally, normal work of  breathing GI: soft, nontender, nondistended, + BS Ext: without cyanosis, clubbing, or edema, Good distal pulses bilaterally Neuro:  Alert and Oriented x 3,  Psych: euthymic mood, full affect       ASSESSMENT & PLAN:   No problem-specific Assessment & Plan notes found for this encounter.   CAC and Aortic Atherosclerosis calcium score 246-no symptoms and LDL 53 10/2021  HTN-BP controlled  HLD LDL 53 10/2021 on lipitor and zetia  Pulmonary Nodules (multiple 2-3 mm former smoker) CT 11/2021 faint nodules likely postinflammatory similar to prior CT.  Carotid bruits-check dopplers.  Anxiety          Dispo:  No follow-ups on file.   Medication Adjustments/Labs and Tests Ordered: Current medicines are reviewed at length with the patient today.  Concerns regarding medicines are outlined above.  Tests Ordered: Orders Placed This Encounter  Procedures   EKG 12-Lead   VAS US CAROTID   Medication Changes: No orders of the defined types were placed in this encounter.  Signed, Ermalinda Barrios, PA-C  06/09/2022 12:14 PM    [address], [place], [place]  [address] Phone: [phone]; Fax: [phone]

## 2022-06-09 ENCOUNTER — Ambulatory Visit: Payer: Medicare Other | Attending: Physician Assistant | Admitting: Physician Assistant

## 2022-06-09 ENCOUNTER — Encounter: Payer: Self-pay | Admitting: Physician Assistant

## 2022-06-09 VITALS — BP 130/70 | HR 63 | Ht 65.0 in | Wt 174.2 lb

## 2022-06-09 DIAGNOSIS — I10 Essential (primary) hypertension: Secondary | ICD-10-CM

## 2022-06-09 DIAGNOSIS — I2584 Coronary atherosclerosis due to calcified coronary lesion: Secondary | ICD-10-CM | POA: Diagnosis not present

## 2022-06-09 DIAGNOSIS — I2510 Atherosclerotic heart disease of native coronary artery without angina pectoris: Secondary | ICD-10-CM

## 2022-06-09 DIAGNOSIS — E785 Hyperlipidemia, unspecified: Secondary | ICD-10-CM

## 2022-06-09 DIAGNOSIS — R0989 Other specified symptoms and signs involving the circulatory and respiratory systems: Secondary | ICD-10-CM

## 2022-06-09 DIAGNOSIS — I700 Atherosclerosis of aorta: Secondary | ICD-10-CM

## 2022-06-09 NOTE — Patient Instructions (Signed)
Medication Instructions:  Your physician recommends that you continue on your current medications as directed. Please refer to the Current Medication list given to you today.  *If you need a refill on your cardiac medications before your next appointment, please call your pharmacy*   Lab Work: None   If you have labs (blood work) drawn today and your tests are completely normal, you will receive your results only by: [place] (if you have MyChart) OR A paper copy in the mail If you have any lab test that is abnormal or we need to change your treatment, we will call you to review the results.   Testing/Procedures: None  Follow-Up: At [facility], you and your health needs are our priority.  As part of our continuing mission to provide you with exceptional heart care, we have created designated Provider Care Teams.  These Care Teams include your primary Cardiologist (physician) and Advanced Practice Providers (APPs -  Physician Assistants and Nurse Practitioners) who all work together to provide you with the care you need, when you need it.  We recommend signing up for the patient portal called "MyChart".  Sign up information is provided on this After Visit Summary.  MyChart is used to connect with patients for Virtual Visits (Telemedicine).  Patients are able to view lab/test results, encounter notes, upcoming appointments, etc.  Non-urgent messages can be sent to your provider as well.   To learn more about what you can do with MyChart, go to [email or web address].    Your next appointment:   12 month(s)  The format for your next appointment:   In Person  Provider:   Werner Lean, MD     Important Information About Sugar

## 2022-06-19 ENCOUNTER — Ambulatory Visit (HOSPITAL_COMMUNITY)
Admission: RE | Admit: 2022-06-19 | Discharge: 2022-06-19 | Disposition: A | Discharge status: Home / Self Care | Payer: Medicare Other | Source: Ambulatory Visit | Attending: Physician Assistant | Admitting: Physician Assistant

## 2022-06-19 DIAGNOSIS — R0989 Other specified symptoms and signs involving the circulatory and respiratory systems: Secondary | ICD-10-CM | POA: Insufficient documentation

## 2022-06-23 ENCOUNTER — Other Ambulatory Visit: Payer: Self-pay | Admitting: Internal Medicine

## 2022-06-25 ENCOUNTER — Telehealth: Payer: Self-pay

## 2022-06-25 DIAGNOSIS — Z23 Encounter for immunization: Secondary | ICD-10-CM | POA: Diagnosis not present

## 2022-06-25 MED ORDER — CITALOPRAM HYDROBROMIDE 20 MG PO TABS: 20.0000 mg | ORAL_TABLET | Freq: Every day | ORAL | 3 refills | Status: DC

## 2022-06-25 NOTE — Telephone Encounter (Signed)
Left message for a call back regarding Rx request for a discontinued medication.

## 2022-06-25 NOTE — Telephone Encounter (Signed)
MEDICATION: citalopram (CELEXA) 20 MG tablet [[id number]]  DISCONTINUED  PHARMACY: [place], [place] - [address] AT [place]  Comments:   **Let patient know to contact pharmacy at the end of the day to make sure medication is ready. **  ** Please notify patient to allow 48-72 hours to process**  **Encourage patient to contact the pharmacy for refills or they can request refills through [facility]**

## 2022-06-25 NOTE — Telephone Encounter (Signed)
Done erx 

## 2022-07-06 ENCOUNTER — Other Ambulatory Visit: Payer: Self-pay | Admitting: Internal Medicine

## 2022-07-06 NOTE — Telephone Encounter (Signed)
Please refill as per office routine med refill policy (all routine meds to be refilled for 3 mo or monthly (per pt preference) up to one year from last visit, then month to month grace period for 3 mo, then further med refills will have to be denied) ? ?

## 2022-09-03 ENCOUNTER — Other Ambulatory Visit: Payer: Self-pay | Admitting: Internal Medicine

## 2022-09-29 DIAGNOSIS — M47896 Other spondylosis, lumbar region: Secondary | ICD-10-CM | POA: Diagnosis not present

## 2022-09-29 DIAGNOSIS — Z5181 Encounter for therapeutic drug level monitoring: Secondary | ICD-10-CM | POA: Diagnosis not present

## 2022-09-30 ENCOUNTER — Other Ambulatory Visit: Payer: Self-pay

## 2022-09-30 MED ORDER — METOPROLOL SUCCINATE ER 50 MG PO TB24: 50.0000 mg | ORAL_TABLET | Freq: Every day | ORAL | 3 refills | Status: DC

## 2022-11-14 ENCOUNTER — Other Ambulatory Visit: Payer: Self-pay | Admitting: Internal Medicine

## 2022-11-25 ENCOUNTER — Telehealth: Payer: Self-pay | Admitting: Internal Medicine

## 2022-11-25 NOTE — Telephone Encounter (Signed)
Called patient to schedule Medicare Annual Wellness Visit (AWV). Left message for patient to call back and schedule Medicare Annual Wellness Visit (AWV).  Last date of AWV: * due 01/06/19 awvi  Please schedule an appointment at any time with [facility].  If any questions, please contact me at [phone].  Thank you ,  Barkley Boards AWV direct phone # [phone]

## 2022-11-26 ENCOUNTER — Telehealth: Payer: Self-pay | Admitting: Internal Medicine

## 2022-11-26 NOTE — Telephone Encounter (Signed)
Contacted Molly Lewis to schedule their annual wellness visit. Appointment made for 12/01/22.  Molly Lewis AWV direct phone # [phone]   Patient returned my call to schedule appt

## 2022-11-26 NOTE — Telephone Encounter (Signed)
Called patient to schedule Medicare Annual Wellness Visit (AWV). Left message for patient to call back and schedule Medicare Annual Wellness Visit (AWV).  Last date of AWV: due 01/06/19 awvi   Please schedule an appointment at any time with NHA.  If any questions, please contact me at [phone].  Thank you ,  Barkley Boards AWV direct phone # [phone]   Returned patient s call

## 2022-12-01 ENCOUNTER — Encounter: Payer: Self-pay | Admitting: Family Medicine

## 2022-12-01 ENCOUNTER — Telehealth (INDEPENDENT_AMBULATORY_CARE_PROVIDER_SITE_OTHER): Payer: Medicare Other | Admitting: Family Medicine

## 2022-12-01 VITALS — Wt 175.0 lb

## 2022-12-01 DIAGNOSIS — Z0000 Encounter for general adult medical examination without abnormal findings: Secondary | ICD-10-CM | POA: Diagnosis not present

## 2022-12-01 DIAGNOSIS — R0982 Postnasal drip: Secondary | ICD-10-CM

## 2022-12-01 DIAGNOSIS — J029 Acute pharyngitis, unspecified: Secondary | ICD-10-CM | POA: Diagnosis not present

## 2022-12-01 NOTE — Patient Instructions (Addendum)
I really enjoyed getting to talk with you today! I am available on Tuesdays and Thursdays for virtual visits if you have any questions or concerns, or if I can be of any further assistance.   CHECKLIST FROM ANNUAL WELLNESS VISIT:  -Follow up (please call to schedule if not scheduled after visit):   -Visit with Ear, Nose and Throat asap. I sent a referral. If you have not been contacted in the next week to schedule - please call to check on status.  -yearly for annual wellness visit with primary care office  Here is a list of your preventive care/health maintenance measures and the plan for each if any are due:  Health Maintenance  Topic Date Due   Zoster Vaccines- Shingrix (2 of 2) 08/02/2022   Pneumonia Vaccine 61+ Years old (3 of 3 - PPSV23 or PCV20) 10/13/2022   Lung Cancer Screening  11/29/2022 - I asked my staff to send orders to Dr. Jenny Reichmann, please call in 1 week to check on status if you have not been contacted.    INFLUENZA VACCINE  12/06/2022 (Originally 04/07/2022)   COLONOSCOPY (Pts 45-41yrs Insurance coverage will need to be confirmed)  09/20/2023   Medicare Annual Wellness (AWV)  12/01/2023   MAMMOGRAM  05/14/2024   DTaP/Tdap/Td (5 - Td or Tdap) 10/18/2029   DEXA SCAN  Completed   Hepatitis C Screening  Completed   HPV VACCINES  Aged Out   COVID-19 Vaccine  Discontinued    -See a dentist at least yearly  -Get your eyes checked and then per your eye specialist's recommendations  -Other issues addressed today:  -I have included below further information regarding a healthy whole foods based diet, physical activity guidelines for adults, stress management and opportunities for social connections. I hope you find this information useful.    -----------------------------------------------------------------------------------------------------------------------------------------------------------------------------------------------------------------------------------------------------------  NUTRITION: -eat real food: lots of colorful vegetables (half the plate) and fruits -5-7 servings of vegetables and fruits per day (fresh or steamed is best), exp. 2 servings of vegetables with lunch and dinner and 2 servings of fruit per day. Berries and greens such as kale and collards are great choices.  -consume on a regular basis: whole grains (make sure first ingredient on label contains the word "whole"), fresh fruits, fish, nuts, seeds, healthy oils (such as olive oil, avocado oil, grape seed oil) -may eat small amounts of dairy and lean meat on occasion, but avoid processed meats such as ham, bacon, lunch meat, etc. -drink water -try to avoid fast food and pre-packaged foods, processed meat -most experts advise limiting sodium to < 2300mg  per day, should limit further is any chronic conditions such as high blood pressure, heart disease, diabetes, etc. The American Heart Association advised that < 1500mg  is is ideal -try to avoid foods that contain any ingredients with names you do not recognize  -try to avoid sugar/sweets (except for the natural sugar that occurs in fresh fruit) -try to avoid sweet drinks -try to avoid white rice, white bread, pasta (unless whole grain), white or yellow potatoes  EXERCISE GUIDELINES FOR ADULTS: -if you wish to increase your physical activity, do so gradually and with the approval of your doctor -STOP and seek medical care immediately if you have any chest pain, chest discomfort or trouble breathing when starting or increasing exercise  -move and stretch your body, legs, feet and arms when sitting for long periods -Physical activity guidelines for optimal health in adults: -least 150 minutes per week of  aerobic exercise (can  talk, but not sing) once approved by your doctor, 20-30 minutes of sustained activity or two 10 minute episodes of sustained activity every day.  -resistance training at least 2 days per week if approved by your doctor -balance exercises 3+ days per week:   Stand somewhere where you have something sturdy to hold onto if you lose balance.    1) lift up on toes, start with 5x per day and work up to 20x   2) stand and lift on leg straight out to the side so that foot is a few inches of the floor, start with 5x each side and work up to 20x each side   3) stand on one foot, start with 5 seconds each side and work up to 20 seconds on each side  If you need ideas or help with getting more active:  -Silver sneakers https://tools.silversneakers.com  -Walk with a Doc: http://stephens-thompson.biz/  -try to include resistance (weight lifting/strength building) and balance exercises twice per week: or the following link for ideas: ChessContest.fr  UpdateClothing.com.cy  STRESS MANAGEMENT: -can try meditating, or just sitting quietly with deep breathing while intentionally relaxing all parts of your body for 5 minutes daily -if you need further help with stress, anxiety or depression please follow up with your primary doctor or contact the wonderful folks at [facility]: [place]: -options in [place] if you wish to engage in more social and exercise related activities:  -Silver sneakers https://tools.silversneakers.com  -Walk with a Doc: http://stephens-thompson.biz/  -Check out the [place] 50+ section on the [place] [employer] (hiking clubs, book clubs, cards and games, chess, exercise classes, aquatic classes and much more) - see the website for  details: https://www.[place].gov/departments/parks-recreation/active-adults50  -YouTube has lots of exercise videos for different ages and abilities as well  -[place] (a variety of indoor and outdoor inperson activities for adults). [phone]. [address]  -Virtual Online Classes (a variety of topics): see seniorplanet.org or call [phone]  -consider volunteering at a school, hospice center, church, senior center or elsewhere   ADVANCED HEALTHCARE DIRECTIVES:  Everyone should have advanced health care directives in place. This is so that you get the care you want, should you ever be in a situation where you are unable to make your own medical decisions.   From the [place] Advanced Directive Website: "[place] are legal documents in which you give written instructions about your health care if, in the future, you cannot speak for yourself.   A health care power of attorney allows you to name a person you trust to make your health care decisions if you cannot make them yourself. A declaration of a desire for a natural death (or living will) is document, which states that you desire not to have your life prolonged by extraordinary measures if you have a terminal or incurable illness or if you are in a vegetative state. An advance instruction for mental health treatment makes a declaration of instructions, information and preferences regarding your mental health treatment. It also states that you are aware that the advance instruction authorizes a mental health treatment provider to act according to your wishes. It may also outline your consent or refusal of mental health treatment. A declaration of an anatomical gift allows anyone over the age of 52 to make a gift by will, organ donor card or other document."   Please see the following website or an elder law attorney for forms, FAQs and  for completion of advanced directives: [place] ([email or web address].no)  Or copy and paste the following to your web browser: PokerReunion.com.cy

## 2022-12-01 NOTE — Progress Notes (Signed)
PATIENT CHECK-IN and HEALTH RISK ASSESSMENT QUESTIONNAIRE:  -completed by phone/video for upcoming Medicare Preventive Visit  Pre-Visit Check-in: 1)Vitals (height, wt, BP, etc) - record in vitals section for visit on day of visit 2)Review and Update Medications, Allergies PMH, Surgeries, Social history in Epic 3)Hospitalizations in the last year with date/reason? no  4)Review and Update Care Team (patient's specialists) in Epic 5) Complete PHQ9 in Epic  6) Complete Fall Screening in Epic 7)Review all Health Maintenance Due and order under PCP if not done.  8)Medicare Wellness Questionnaire: Answer theses question about your habits: Do you drink alcohol? occasionally If yes, how many drinks do you have a day? Have you ever smoked?yes Quit date if applicable? 2010  How many packs a day do/did you smoke? 1.5 packs a day previously Do you use smokeless tobacco?no Do you use an illicit drugs?no Do you exercises? No - but walks a lot 4-5 days per week at her job - in [employer], she does not sit - likes to keep moving Are you sexually active? No Number of partners? 1 Typical breakfast-boiled egg, bagel Typical lunch-sandwich or salad Typical dinner-often skips dinner-light meal on occasion Typical snacks: potato chips, etc  Beverages: half and half sweet tea, occasional soda  Answer theses question about you: Can you perform most household chores?yes Do you find it hard to follow a conversation in a noisy room?occasionally Do you often ask people to speak up or repeat themselves?yes -mild at this time, reports does not desire evaluation at this time Do you feel that you have a problem with memory?no Do you balance your checkbook and or bank acounts?no Do you feel safe at home?yes Last dentist visit?1 year ago Do you need assistance with any of the following: Please note if so   Driving?no  Feeding yourself? no  Getting from bed to chair? no  Getting to the toilet? no  Bathing or  showering? no  Dressing yourself? No   Managing money? No   Climbing a flight of stairs-no  Preparing meals? no  Do you have Advanced Directives in place (Living Will, Healthcare Power or Attorney)? No - reports son is going to help her do this   Last eye Exam and location?[facility]-1.5 years ago   Do you currently use prescribed or non-prescribed narcotic or opioid pain medications?no  Do you have a history or close family history of breast, ovarian, tubal or peritoneal cancer or a family member with BRCA (breast cancer susceptibility 1 and 2) gene mutations? no  Nurse/Assistant Credentials/time stamp: Luellen Pucker   ----------------------------------------------------------------------------------------------------------------------------------------------------------------------------------------------------------------------   MEDICARE ANNUAL PREVENTIVE VISIT WITH PROVIDER: (Welcome to [employer], initial annual wellness or annual wellness exam)  Virtual Visit via Phone Note  I connected with Kathi Ludwig on 12/01/22 by phone and verified that I am speaking with the correct person using two identifiers.  Location patient: home Location provider:work or home office Persons participating in the virtual visit: patient, provider  Concerns and/or follow up today:  Sinus issues/pnd: -tends to be worse in the spring -taking an allergy pill - but it is not helping -now mucus has become thick and drains in the throat and now a little worse -sometimes sore throat in the morning with this -also has some sneezing, occ cough -no fever, sinus pain, CP, SOB -she has tried multiple allergy pills and flonase -also has acid reflux/hiatal hernia - on ppi    See HM section in Epic for other details of completed HM.    ROS:  negative for report of fevers, unintentional weight loss, vision changes, vision loss, hearing loss or change, chest pain, sob, hemoptysis, melena,  hematochezia, hematuria, falls, bleeding or bruising, loc, thoughts of suicide or self harm, memory loss  Patient-completed extensive health risk assessment - reviewed and discussed with the patient: See Health Risk Assessment completed with patient prior to the visit either above or in recent phone note. This was reviewed in detailed with the patient today and appropriate recommendations, orders and referrals were placed as needed per Summary below and patient instructions.   Review of Medical History: -PMH, [place], Family History and current specialty and care providers reviewed and updated and listed below   Patient Care Team: Biagio Borg, MD as PCP - General Werner Lean, MD as PCP - Cardiology (Cardiology) Marica Otter, OD (Optometry)   Past Medical History:  Diagnosis Date   Allergic rhinitis, cause unspecified 07/18/2011   Allergy    ANXIETY 06/23/2007   BREAST BIOPSY, HX OF 06/23/2007   benign   Bursitis    Cataract    bilateral lens implants   Chronic back pain    Chronic constipation    "i have had it all my life and the last time i had a colonoscopy they tol me i have a distened colon because of it"    COLONIC POLYPS, HX OF 06/27/2008   COMMON MIGRAINE 06/23/2007   occasional   DEPRESSION 06/23/2007   Detached retina    GERD 06/23/2007   HYPERLIPIDEMIA 06/23/2007   Hypertension    Impaired glucose tolerance 02/16/2011   INSOMNIA-SLEEP DISORDER-UNSPEC 06/26/2009   Irritable bowel syndrome 06/23/2007   diet controlled   Microhematuria 07/31/2015   OSTEOARTHRITIS, LUMBAR SPINE 06/23/2007   OSTEOPENIA 07/11/2008   Pre-diabetes     Past Surgical History:  Procedure Laterality Date   APPENDECTOMY     BIOPSY  09/19/2020   Procedure: BIOPSY;  Surgeon: Irving Copas., MD;  Location: [facility];  Service: Gastroenterology;;   CATARACT EXTRACTION     BIL   colonoscopy  02/2019   COLONOSCOPY  09/2013, 07/2008, 08/2003, 07/1999   multiple    COLONOSCOPY WITH PROPOFOL N/A 03/29/2019   Procedure: COLONOSCOPY WITH PROPOFOL;  Surgeon: Irving Copas., MD;  Location: [facility];  Service: Gastroenterology;  Laterality: N/A;   COLONOSCOPY WITH PROPOFOL N/A 09/19/2020   Procedure: COLONOSCOPY WITH PROPOFOL;  Surgeon: Rush Landmark Telford Nab., MD;  Location: [facility];  Service: Gastroenterology;  Laterality: N/A;   ENDOSCOPIC MUCOSAL RESECTION N/A 03/29/2019   Procedure: ENDOSCOPIC MUCOSAL RESECTION;  Surgeon: Rush Landmark Telford Nab., MD;  Location: [facility];  Service: Gastroenterology;  Laterality: N/A;   HEMOSTASIS CLIP PLACEMENT  03/29/2019   Procedure: HEMOSTASIS CLIP PLACEMENT;  Surgeon: Irving Copas., MD;  Location: [facility];  Service: Gastroenterology;;   POLYPECTOMY  09/19/2020   Procedure: POLYPECTOMY;  Surgeon: Irving Copas., MD;  Location: [facility];  Service: Gastroenterology;;   REPLACEMENT TOTAL KNEE Right 04/2020   RETINAL DETACHMENT SURGERY Right 01/2019   RETINAL TEAR REPAIR CRYOTHERAPY Right    SUBMUCOSAL LIFTING INJECTION  03/29/2019   Procedure: SUBMUCOSAL LIFTING INJECTION;  Surgeon: Irving Copas., MD;  Location: [facility];  Service: Gastroenterology;;   TOTAL ABDOMINAL HYSTERECTOMY     UPPER GASTROINTESTINAL ENDOSCOPY  10/2003   WISDOM TOOTH EXTRACTION      Social History   Socioeconomic History   Marital status: Married    Spouse name: Not on file   Number of children: 2   Years  of education: Not on file   Highest education level: Not on file  Occupational History   Not on file  Tobacco Use   Smoking status: Former    Packs/day: 1.00    Years: 35.00    Additional pack years: 0.00    Total pack years: 35.00    Types: Cigarettes    Quit date: 04/16/2009    Years since quitting: 13.6   Smokeless tobacco: Never   Tobacco comments:    Quit smoking 04/15/09  Vaping Use   Vaping Use: Never used  Substance and Sexual Activity   Alcohol use: Not Currently     Comment: occasional   Drug use: No   Sexual activity: Yes    Birth control/protection: Surgical    Comment: Hysterectomy  Other Topics Concern   Not on file  Social History Narrative   Not on file   Social Determinants of Health   Financial Resource Strain: Not on file  Food Insecurity: Not on file  Transportation Needs: Not on file  Physical Activity: Not on file  Stress: Not on file  Social Connections: Not on file  Intimate Partner Violence: Not on file    Family History  Problem Relation Age of Onset   Heart disease Father        MI in early 3's   Hypertension Father    Brain cancer Mother    Brain cancer Maternal Grandmother    Colon cancer Neg Hx    Esophageal cancer Neg Hx    Rectal cancer Neg Hx    Stomach cancer Neg Hx     Current Outpatient Medications on File Prior to Visit  Medication Sig Dispense Refill   ALPRAZolam (XANAX) 1 MG tablet TAKE 1 TABLET BY MOUTH TWICE DAILY AS NEEDED 60 tablet 2   ASPIRIN LOW DOSE 81 MG tablet TAKE 1 TABLET(81 MG) BY MOUTH DAILY. SWALLOW WHOLE 90 tablet 3   atorvastatin (LIPITOR) 80 MG tablet Take 1 tablet (80 mg total) by mouth daily. 90 tablet 2   Bacillus Coagulans-Inulin (PROBIOTIC-PREBIOTIC PO) Take 1 capsule by mouth daily.     Calcium-Magnesium-Vitamin D (CALCIUM 1200+D3 PO) Take 1 tablet by mouth daily.     celecoxib (CELEBREX) 200 MG capsule TAKE 1 CAPSULE(200 MG) BY MOUTH TWICE DAILY AS NEEDED FOR MILD PAIN OR MODERATE PAIN 180 capsule 2   citalopram (CELEXA) 20 MG tablet Take 1 tablet (20 mg total) by mouth daily. 90 tablet 3   cyclobenzaprine (FLEXERIL) 5 MG tablet Take 1 tablet (5 mg total) by mouth 3 (three) times daily as needed for muscle spasms. 40 tablet 1   ezetimibe (ZETIA) 10 MG tablet TAKE 1 TABLET(10 MG) BY MOUTH DAILY 90 tablet 2   hydrochlorothiazide (MICROZIDE) 12.5 MG capsule TAKE 1 CAPSULE(12.5 MG) BY MOUTH DAILY 90 capsule 1   losartan (COZAAR) 100 MG tablet TAKE 1 TABLET(100 MG) BY MOUTH DAILY  90 tablet 1   metoprolol succinate (TOPROL-XL) 50 MG 24 hr tablet Take 1 tablet (50 mg total) by mouth daily. Take with or immediately following a meal. 90 tablet 3   metroNIDAZOLE (METROGEL) 0.75 % gel Apply 1 application topically 2 (two) times daily.   2   Multiple Vitamin (MULTIVITAMIN WITH MINERALS) TABS tablet Take 1 tablet by mouth daily.     omeprazole (PRILOSEC) 20 MG capsule TAKE 1 CAPSULE BY MOUTH TWICE DAILY 180 capsule 3   No current facility-administered medications on file prior to visit.    Allergies  Allergen Reactions  Codeine Nausea And Vomiting    Violent vomiting       Physical Exam There were no vitals filed for this visit. Estimated body mass index is 29.12 kg/m as calculated from the following:   Height as of 06/09/22: 5\' 5"  (1.651 m).   Weight as of this encounter: 175 lb (79.4 kg).  EKG (optional): deferred due to virtual visit  GENERAL: alert, oriented, no acute distress detected, full vision exam deferred due to pandemic and/or virtual encounter  PSYCH/NEURO: pleasant and cooperative, no obvious depression or anxiety, speech and thought processing grossly intact, Cognitive function grossly intact  Flowsheet Row Video Visit from 12/01/2022 in [facility]  PHQ-9 Total Score 4           12/01/2022    4:16 PM 10/23/2021    9:50 AM 10/23/2021    9:10 AM 10/22/2020    8:40 AM 04/17/2020    8:15 AM  Depression screen PHQ 2/9  Decreased Interest 1 0 0 0 0  Down, Depressed, Hopeless 0 0 0 0 0  PHQ - 2 Score 1 0 0 0 0  Altered sleeping 3    0  Tired, decreased energy 0    0  Change in appetite 0    0  Feeling bad or failure about yourself  0    0  Trouble concentrating 0    0  Moving slowly or fidgety/restless 0    0  Suicidal thoughts 0    0  PHQ-9 Score 4    0  Difficult doing work/chores     Not difficult at all       10/22/2020    8:18 AM 10/22/2020    8:40 AM 10/23/2021    9:10 AM 10/23/2021    9:50 AM  12/01/2022    4:16 PM  Fall Risk  Falls in the past year? 0 0 0 0 0  Was there an injury with Fall? 0  0 0 0  Fall Risk Category Calculator 0  0 0 0  Fall Risk Category (Retired) Low  Low Low   (RETIRED) Patient Fall Risk Level Low fall risk  Low fall risk    Patient at Risk for Falls Due to     No Fall Risks  Fall risk Follow up     Falls evaluation completed     SUMMARY AND PLAN:    Sore throat - Plan: Ambulatory referral to ENT PND (post-nasal drip) - Plan: Ambulatory referral to ENT -query silent reflux, uncontrolled allergies vs other -given hx and smoking hx advised seeing ENT and she was agreeable -discussed seeing allergist, but she opted to start with ENT -referral sent, advised she call in 1 week to check on status if has not been contacted regarding this appointment  Encounter for Medicare annual wellness exam   Discussed applicable health maintenance/preventive health measures and advised and referred or ordered per patient preferences:  Health Maintenance  Topic Date Due   Zoster Vaccines- Shingrix (2 of 2) 08/02/2022, discussed, she plans to go get 2nd dose   Pneumonia Vaccine 58+ Years old (3 of 3 - PPSV23 or PCV20) 10/13/2022, discussed, she is considering   Lung Cancer Screening  11/29/2022 - advised, she had CT scan last year, she thinks she has seen pulm - but I do not see notes. Advised staff to order through PCP to keep in the loop.    INFLUENZA VACCINE  12/06/2022 (Originally 04/07/2022)   COLONOSCOPY (Pts  45-72yrs Insurance coverage will need to be confirmed)  09/20/2023   Medicare Annual Wellness (AWV)  12/01/2023   MAMMOGRAM  05/14/2024   DTaP/Tdap/Td (5 - Td or Tdap) 10/18/2029   DEXA SCAN  Completed   Hepatitis C Screening  Completed   HPV VACCINES  Aged Out   COVID-19 Vaccine  Discontinued    Education and counseling on the following was provided based on the above review of health and a plan/checklist for the patient, along with additional  information discussed, was provided for the patient in the patient instructions :  -Advised and discussed advanced directives - resources provided in pt instructions -she declined audiology eval -Provided counseling and plan for increased risk of falling if applicable per above screening. (Referral for PT, community based exercise programs, etc.) -Advised and counseled on maintaining healthy weight and healthy lifestyle - including the importance of a healthy diet, regular physical activity, social connections and stress management. -Advised and counseled on a whole foods based healthy die. A summary of a healthy diet was provided in the Patient Instructions. - Recommended regular exercise and discussed options for at home and within the community.  -Advise yearly dental visits at minimum and regular eye exams  Follow up: see patient instructions     Patient Instructions  I really enjoyed getting to talk with you today! I am available on Tuesdays and Thursdays for virtual visits if you have any questions or concerns, or if I can be of any further assistance.   CHECKLIST FROM ANNUAL WELLNESS VISIT:  -Follow up (please call to schedule if not scheduled after visit):   -Visit with Ear, Nose and Throat asap. I sent a referral. If you have not been contacted in the next week to schedule - please call to check on status.  -yearly for annual wellness visit with primary care office  Here is a list of your preventive care/health maintenance measures and the plan for each if any are due:  Health Maintenance  Topic Date Due   Zoster Vaccines- Shingrix (2 of 2) 08/02/2022   Pneumonia Vaccine 24+ Years old (3 of 3 - PPSV23 or PCV20) 10/13/2022   Lung Cancer Screening  11/29/2022 - I asked my staff to send orders to Dr. Jenny Reichmann, please call in 1 week to check on status if you have not been contacted.    INFLUENZA VACCINE  12/06/2022 (Originally 04/07/2022)   COLONOSCOPY (Pts 45-28yrs Insurance coverage  will need to be confirmed)  09/20/2023   Medicare Annual Wellness (AWV)  12/01/2023   MAMMOGRAM  05/14/2024   DTaP/Tdap/Td (5 - Td or Tdap) 10/18/2029   DEXA SCAN  Completed   Hepatitis C Screening  Completed   HPV VACCINES  Aged Out   COVID-19 Vaccine  Discontinued    -See a dentist at least yearly  -Get your eyes checked and then per your eye specialist's recommendations  -Other issues addressed today:  -I have included below further information regarding a healthy whole foods based diet, physical activity guidelines for adults, stress management and opportunities for social connections. I hope you find this information useful.   -----------------------------------------------------------------------------------------------------------------------------------------------------------------------------------------------------------------------------------------------------------  NUTRITION: -eat real food: lots of colorful vegetables (half the plate) and fruits -5-7 servings of vegetables and fruits per day (fresh or steamed is best), exp. 2 servings of vegetables with lunch and dinner and 2 servings of fruit per day. Berries and greens such as kale and collards are great choices.  -consume on a regular basis: whole grains (make sure  first ingredient on label contains the word "whole"), fresh fruits, fish, nuts, seeds, healthy oils (such as olive oil, avocado oil, grape seed oil) -may eat small amounts of dairy and lean meat on occasion, but avoid processed meats such as ham, bacon, lunch meat, etc. -drink water -try to avoid fast food and pre-packaged foods, processed meat -most experts advise limiting sodium to < 2300mg  per day, should limit further is any chronic conditions such as high blood pressure, heart disease, diabetes, etc. The American Heart Association advised that < 1500mg  is is ideal -try to avoid foods that contain any ingredients with names you do not recognize  -try to  avoid sugar/sweets (except for the natural sugar that occurs in fresh fruit) -try to avoid sweet drinks -try to avoid white rice, white bread, pasta (unless whole grain), white or yellow potatoes  EXERCISE GUIDELINES FOR ADULTS: -if you wish to increase your physical activity, do so gradually and with the approval of your doctor -STOP and seek medical care immediately if you have any chest pain, chest discomfort or trouble breathing when starting or increasing exercise  -move and stretch your body, legs, feet and arms when sitting for long periods -Physical activity guidelines for optimal health in adults: -least 150 minutes per week of aerobic exercise (can talk, but not sing) once approved by your doctor, 20-30 minutes of sustained activity or two 10 minute episodes of sustained activity every day.  -resistance training at least 2 days per week if approved by your doctor -balance exercises 3+ days per week:   Stand somewhere where you have something sturdy to hold onto if you lose balance.    1) lift up on toes, start with 5x per day and work up to 20x   2) stand and lift on leg straight out to the side so that foot is a few inches of the floor, start with 5x each side and work up to 20x each side   3) stand on one foot, start with 5 seconds each side and work up to 20 seconds on each side  If you need ideas or help with getting more active:  -Silver sneakers https://tools.silversneakers.com  -Walk with a Doc: http://stephens-thompson.biz/  -try to include resistance (weight lifting/strength building) and balance exercises twice per week: or the following link for ideas: ChessContest.fr  UpdateClothing.com.cy  STRESS MANAGEMENT: -can try meditating, or just sitting quietly with deep breathing while intentionally relaxing all parts of your body for 5 minutes daily -if you need further help with  stress, anxiety or depression please follow up with your primary doctor or contact the wonderful folks at [place]: [place]: -options in [place] if you wish to engage in more social and exercise related activities:  -Silver sneakers https://tools.silversneakers.com  -Walk with a Doc: http://stephens-thompson.biz/  -Check out the [place] 50+ section on the [place] of [employer] (hiking clubs, book clubs, cards and games, chess, exercise classes, aquatic classes and much more) - see the website for details: https://www.[place].gov/departments/parks-recreation/active-adults50  -YouTube has lots of exercise videos for different ages and abilities as well  -[place] (a variety of indoor and outdoor inperson activities for adults). [phone]. [address]  -Virtual Online Classes (a variety of topics): see seniorplanet.org or call [phone]  -consider volunteering at a school, hospice center, church, senior center or elsewhere   ADVANCED HEALTHCARE DIRECTIVES:  Everyone should have advanced health care directives in place. This is so that you get the  care you want, should you ever be in a situation where you are unable to make your own medical decisions.   From the [place] Advanced Directive Website: "[place] are legal documents in which you give written instructions about your health care if, in the future, you cannot speak for yourself.   A health care power of attorney allows you to name a person you trust to make your health care decisions if you cannot make them yourself. A declaration of a desire for a natural death (or living will) is document, which states that you desire not to have your life prolonged by extraordinary measures if you have a terminal or incurable illness or if you are in a vegetative state. An advance instruction for mental health treatment makes a  declaration of instructions, information and preferences regarding your mental health treatment. It also states that you are aware that the advance instruction authorizes a mental health treatment provider to act according to your wishes. It may also outline your consent or refusal of mental health treatment. A declaration of an anatomical gift allows anyone over the age of 16 to make a gift by will, organ donor card or other document."   Please see the following website or an elder law attorney for forms, FAQs and for completion of advanced directives: [place] Secretary of [place] ([email or web address])  Or copy and paste the following to your web browser: [email or web address]    Lucretia Kern, DO

## 2022-12-02 ENCOUNTER — Telehealth: Payer: Self-pay | Admitting: *Deleted

## 2022-12-02 DIAGNOSIS — R918 Other nonspecific abnormal finding of lung field: Secondary | ICD-10-CM

## 2022-12-02 NOTE — Telephone Encounter (Signed)
Teams message was received from Dr Maudie Mercury to order lung cancer screening test for the patient due to history of small nodules.  Referral placed  as requested.

## 2022-12-03 ENCOUNTER — Other Ambulatory Visit: Payer: Self-pay | Admitting: Internal Medicine

## 2022-12-03 ENCOUNTER — Encounter: Payer: Medicare Other | Admitting: Internal Medicine

## 2023-01-05 DIAGNOSIS — Z23 Encounter for immunization: Secondary | ICD-10-CM | POA: Diagnosis not present

## 2023-01-07 ENCOUNTER — Other Ambulatory Visit: Payer: Self-pay

## 2023-01-07 MED ORDER — ATORVASTATIN CALCIUM 80 MG PO TABS: 80.0000 mg | ORAL_TABLET | Freq: Every day | ORAL | 0 refills | Status: DC

## 2023-01-28 ENCOUNTER — Ambulatory Visit (INDEPENDENT_AMBULATORY_CARE_PROVIDER_SITE_OTHER): Payer: Medicare Other | Admitting: Internal Medicine

## 2023-01-28 ENCOUNTER — Encounter: Payer: Self-pay | Admitting: Internal Medicine

## 2023-01-28 VITALS — BP 132/78 | HR 63 | Temp 98.6°F | Ht 65.0 in | Wt 182.0 lb

## 2023-01-28 DIAGNOSIS — R7302 Impaired glucose tolerance (oral): Secondary | ICD-10-CM

## 2023-01-28 DIAGNOSIS — E7800 Pure hypercholesterolemia, unspecified: Secondary | ICD-10-CM

## 2023-01-28 DIAGNOSIS — E559 Vitamin D deficiency, unspecified: Secondary | ICD-10-CM | POA: Diagnosis not present

## 2023-01-28 DIAGNOSIS — I10 Essential (primary) hypertension: Secondary | ICD-10-CM

## 2023-01-28 DIAGNOSIS — E538 Deficiency of other specified B group vitamins: Secondary | ICD-10-CM

## 2023-01-28 DIAGNOSIS — K219 Gastro-esophageal reflux disease without esophagitis: Secondary | ICD-10-CM

## 2023-01-28 DIAGNOSIS — J309 Allergic rhinitis, unspecified: Secondary | ICD-10-CM

## 2023-01-28 DIAGNOSIS — M47816 Spondylosis without myelopathy or radiculopathy, lumbar region: Secondary | ICD-10-CM | POA: Diagnosis not present

## 2023-01-28 LAB — HEPATIC FUNCTION PANEL
ALT: 35 U/L (ref 0–35)
AST: 28 U/L (ref 0–37)
Albumin: 4.1 g/dL (ref 3.5–5.2)
Alkaline Phosphatase: 71 U/L (ref 39–117)
Bilirubin, Direct: 0.1 mg/dL (ref 0.0–0.3)
Total Bilirubin: 0.5 mg/dL (ref 0.2–1.2)
Total Protein: 6.9 g/dL (ref 6.0–8.3)

## 2023-01-28 LAB — MICROALBUMIN / CREATININE URINE RATIO
Creatinine,U: 57 mg/dL
Microalb Creat Ratio: 1.5 mg/g (ref 0.0–30.0)
Microalb, Ur: 0.9 mg/dL (ref 0.0–1.9)

## 2023-01-28 LAB — LIPID PANEL
Cholesterol: 116 mg/dL (ref 0–200)
HDL: 40 mg/dL (ref >39.00)
LDL Cholesterol: 39 mg/dL (ref 0–99)
NonHDL: 76.13
Total CHOL/HDL Ratio: 3
Triglycerides: 185 mg/dL — ABNORMAL HIGH (ref 0.0–149.0)
VLDL: 37 mg/dL (ref 0.0–40.0)

## 2023-01-28 LAB — URINALYSIS, ROUTINE W REFLEX MICROSCOPIC
Bilirubin Urine: NEGATIVE
Ketones, ur: NEGATIVE
Nitrite: NEGATIVE
Specific Gravity, Urine: 1.02 (ref 1.000–1.030)
Total Protein, Urine: NEGATIVE
Urine Glucose: NEGATIVE
Urobilinogen, UA: 0.2 (ref 0.0–1.0)
pH: 7 (ref 5.0–8.0)

## 2023-01-28 LAB — VITAMIN D 25 HYDROXY (VIT D DEFICIENCY, FRACTURES): VITD: 67.08 ng/mL (ref 30.00–100.00)

## 2023-01-28 LAB — BASIC METABOLIC PANEL
BUN: 17 mg/dL (ref 6–23)
CO2: 32 mEq/L (ref 19–32)
Calcium: 9.3 mg/dL (ref 8.4–10.5)
Chloride: 99 mEq/L (ref 96–112)
Creatinine, Ser: 0.91 mg/dL (ref 0.40–1.20)
GFR: 64.13 mL/min (ref >60.00)
Glucose, Bld: 109 mg/dL — ABNORMAL HIGH (ref 70–99)
Potassium: 3.6 mEq/L (ref 3.5–5.1)
Sodium: 139 mEq/L (ref 135–145)

## 2023-01-28 LAB — CBC WITH DIFFERENTIAL/PLATELET
Basophils Absolute: 0 10*3/uL (ref 0.0–0.1)
Basophils Relative: 0.9 % (ref 0.0–3.0)
Eosinophils Absolute: 0.2 10*3/uL (ref 0.0–0.7)
Eosinophils Relative: 3.7 % (ref 0.0–5.0)
HCT: 39.8 % (ref 36.0–46.0)
Hemoglobin: 13.5 g/dL (ref 12.0–15.0)
Lymphocytes Relative: 48.1 % — ABNORMAL HIGH (ref 12.0–46.0)
Lymphs Abs: 2.1 10*3/uL (ref 0.7–4.0)
MCHC: 33.9 g/dL (ref 30.0–36.0)
MCV: 88.5 fl (ref 78.0–100.0)
Monocytes Absolute: 0.4 10*3/uL (ref 0.1–1.0)
Monocytes Relative: 8.2 % (ref 3.0–12.0)
Neutro Abs: 1.7 10*3/uL (ref 1.4–7.7)
Neutrophils Relative %: 39.1 % — ABNORMAL LOW (ref 43.0–77.0)
Platelets: 163 10*3/uL (ref 150.0–400.0)
RBC: 4.49 Mil/uL (ref 3.87–5.11)
RDW: 13.3 % (ref 11.5–15.5)
WBC: 4.4 10*3/uL (ref 4.0–10.5)

## 2023-01-28 LAB — VITAMIN B12: Vitamin B-12: 277 pg/mL (ref 211–911)

## 2023-01-28 LAB — TSH: TSH: 0.89 u[IU]/mL (ref 0.35–5.50)

## 2023-01-28 LAB — HEMOGLOBIN A1C: Hgb A1c MFr Bld: 6.6 % — ABNORMAL HIGH (ref 4.6–6.5)

## 2023-01-28 MED ORDER — METOPROLOL SUCCINATE ER 50 MG PO TB24: 50.0000 mg | ORAL_TABLET | Freq: Every day | ORAL | 3 refills | Status: DC

## 2023-01-28 MED ORDER — PANTOPRAZOLE SODIUM 40 MG PO TBEC: 40.0000 mg | DELAYED_RELEASE_TABLET | Freq: Every day | ORAL | 3 refills | Status: DC

## 2023-01-28 MED ORDER — LOSARTAN POTASSIUM 100 MG PO TABS: ORAL_TABLET | ORAL | 3 refills | Status: DC

## 2023-01-28 MED ORDER — HYDROCHLOROTHIAZIDE 12.5 MG PO CAPS: 12.5000 mg | ORAL_CAPSULE | Freq: Every day | ORAL | 3 refills | Status: DC

## 2023-01-28 MED ORDER — ALPRAZOLAM 1 MG PO TABS: ORAL_TABLET | ORAL | 2 refills | Status: DC

## 2023-01-28 MED ORDER — METRONIDAZOLE 0.75 % EX GEL: 1.0000 | Freq: Two times a day (BID) | CUTANEOUS | 2 refills | Status: AC

## 2023-01-28 MED ORDER — ATORVASTATIN CALCIUM 80 MG PO TABS: 80.0000 mg | ORAL_TABLET | Freq: Every day | ORAL | 3 refills | Status: DC

## 2023-01-28 MED ORDER — CITALOPRAM HYDROBROMIDE 20 MG PO TABS: 20.0000 mg | ORAL_TABLET | Freq: Every day | ORAL | 3 refills | Status: DC

## 2023-01-28 MED ORDER — EZETIMIBE 10 MG PO TABS: ORAL_TABLET | ORAL | 3 refills | Status: DC

## 2023-01-28 MED ORDER — ALBUTEROL SULFATE HFA 108 (90 BASE) MCG/ACT IN AERS: 2.0000 | INHALATION_SPRAY | Freq: Four times a day (QID) | RESPIRATORY_TRACT | 5 refills | Status: DC | PRN

## 2023-01-28 MED ORDER — CYCLOBENZAPRINE HCL 5 MG PO TABS: 5.0000 mg | ORAL_TABLET | Freq: Three times a day (TID) | ORAL | 1 refills | Status: AC | PRN

## 2023-01-28 MED ORDER — CELECOXIB 200 MG PO CAPS: ORAL_CAPSULE | ORAL | 3 refills | Status: DC

## 2023-01-28 NOTE — Progress Notes (Signed)
Patient ID: Molly Lewis, female   DOB: 1951/02/23, 71 y.o.   MRN: [id number]        Chief Complaint: follow up GERD, allergies, hld, hyperglycemia       HPI:  Molly Lewis is a 72 y.o. female Does have several wks ongoing nasal allergy symptoms with clearish congestion, itch and sneezing, without fever, pain, ST, cough, swelling or wheezing.  Has had mild worsening reflux, without abd pain, dysphagia, n/v, bowel change or blood.  Pt denies chest pain, increased sob or doe, wheezing, orthopnea, PND, increased LE swelling, palpitations, dizziness or syncope.   Pt denies polydipsia, polyuria, or new focal neuro s/s.    Pt denies fever, wt loss, night sweats, loss of appetite, or other constitutional symptoms         Wt Readings from Last 3 Encounters:  01/28/23 182 lb (82.6 kg)  12/01/22 175 lb (79.4 kg)  06/09/22 174 lb 4 oz (79 kg)   BP Readings from Last 3 Encounters:  01/28/23 132/78  06/09/22 130/70  05/05/22 138/80         Past Medical History:  Diagnosis Date   Allergic rhinitis, cause unspecified 07/18/2011   Allergy    ANXIETY 06/23/2007   BREAST BIOPSY, HX OF 06/23/2007   benign   Bursitis    Cataract    bilateral lens implants   Chronic back pain    Chronic constipation    "i have had it all my life and the last time i had a colonoscopy they tol me i have a distened colon because of it"    COLONIC POLYPS, HX OF 06/27/2008   COMMON MIGRAINE 06/23/2007   occasional   DEPRESSION 06/23/2007   Detached retina    GERD 06/23/2007   HYPERLIPIDEMIA 06/23/2007   Hypertension    Impaired glucose tolerance 02/16/2011   INSOMNIA-SLEEP DISORDER-UNSPEC 06/26/2009   Irritable bowel syndrome 06/23/2007   diet controlled   Microhematuria 07/31/2015   OSTEOARTHRITIS, LUMBAR SPINE 06/23/2007   OSTEOPENIA 07/11/2008   Pre-diabetes    Past Surgical History:  Procedure Laterality Date   APPENDECTOMY     BIOPSY  09/19/2020   Procedure: BIOPSY;  Surgeon: Lemar Lofty.,  MD;  Location: [facility] ENDOSCOPY;  Service: Gastroenterology;;   CATARACT EXTRACTION     BIL   colonoscopy  02/2019   COLONOSCOPY  09/2013, 07/2008, 08/2003, 07/1999   multiple   COLONOSCOPY WITH PROPOFOL N/A 03/29/2019   Procedure: COLONOSCOPY WITH PROPOFOL;  Surgeon: Lemar Lofty., MD;  Location: [facility] ENDOSCOPY;  Service: Gastroenterology;  Laterality: N/A;   COLONOSCOPY WITH PROPOFOL N/A 09/19/2020   Procedure: COLONOSCOPY WITH PROPOFOL;  Surgeon: Meridee Score Netty Starring., MD;  Location: [facility] ENDOSCOPY;  Service: Gastroenterology;  Laterality: N/A;   ENDOSCOPIC MUCOSAL RESECTION N/A 03/29/2019   Procedure: ENDOSCOPIC MUCOSAL RESECTION;  Surgeon: Meridee Score Netty Starring., MD;  Location: [facility] ENDOSCOPY;  Service: Gastroenterology;  Laterality: N/A;   HEMOSTASIS CLIP PLACEMENT  03/29/2019   Procedure: HEMOSTASIS CLIP PLACEMENT;  Surgeon: Lemar Lofty., MD;  Location: [facility] ENDOSCOPY;  Service: Gastroenterology;;   POLYPECTOMY  09/19/2020   Procedure: POLYPECTOMY;  Surgeon: Lemar Lofty., MD;  Location: [facility] ENDOSCOPY;  Service: Gastroenterology;;   REPLACEMENT TOTAL KNEE Right 04/2020   RETINAL DETACHMENT SURGERY Right 01/2019   RETINAL TEAR REPAIR CRYOTHERAPY Right    SUBMUCOSAL LIFTING INJECTION  03/29/2019   Procedure: SUBMUCOSAL LIFTING INJECTION;  Surgeon: Lemar Lofty., MD;  Location: [facility] ENDOSCOPY;  Service: Gastroenterology;;   TOTAL ABDOMINAL HYSTERECTOMY  UPPER GASTROINTESTINAL ENDOSCOPY  10/2003   WISDOM TOOTH EXTRACTION      reports that she quit smoking about 13 years ago. Her smoking use included cigarettes. She has a 35.00 pack-year smoking history. She has never used smokeless tobacco. She reports that she does not currently use alcohol. She reports that she does not use drugs. family history includes Brain cancer in her maternal grandmother and mother; Heart disease in her father; Hypertension in her father. Allergies  Allergen Reactions   Codeine Nausea  And Vomiting    Violent vomiting   Prevnar 20 [Pneumococcal 20-Val Conj Vacc] Other (See Comments)    Rash to torso   Current Outpatient Medications on File Prior to Visit  Medication Sig Dispense Refill   ASPIRIN LOW DOSE 81 MG tablet TAKE 1 TABLET(81 MG) BY MOUTH DAILY. SWALLOW WHOLE 90 tablet 3   Bacillus Coagulans-Inulin (PROBIOTIC-PREBIOTIC PO) Take 1 capsule by mouth daily.     Calcium-Magnesium-Vitamin D (CALCIUM 1200+D3 PO) Take 1 tablet by mouth daily.     HYDROcodone-acetaminophen (NORCO/VICODIN) 5-325 MG tablet TAKE 1 TABLET BY MOUTH THREE TIMES DAILY FOR 5 DAYS AS NEEDED     Multiple Vitamin (MULTIVITAMIN WITH MINERALS) TABS tablet Take 1 tablet by mouth daily.     No current facility-administered medications on file prior to visit.        ROS:  All others reviewed and negative.  Objective        PE:  BP 132/78 (BP Location: Right Arm, Patient Position: Sitting, Cuff Size: Normal)   Pulse 63   Temp 98.6 F (37 C) (Oral)   Ht 5\' 5"  (1.651 m)   Wt 182 lb (82.6 kg)   LMP  (LMP Unknown)   SpO2 100%   BMI 30.29 kg/m                 Constitutional: Pt appears in NAD               HENT: Head: NCAT.                Right Ear: External ear normal.                 Left Ear: External ear normal. Bilat tm's with mild erythema.  Max sinus areas non tender.  Pharynx with mild erythema, no exudate               Eyes: . Pupils are equal, round, and reactive to light. Conjunctivae and EOM are normal               Nose: without d/c or deformity               Neck: Neck supple. Gross normal ROM               Cardiovascular: Normal rate and regular rhythm.                 Pulmonary/Chest: Effort normal and breath sounds without rales or wheezing.                Abd:  Soft, NT, ND, + BS, no organomegaly               Neurological: Pt is alert. At baseline orientation, motor grossly intact               Skin: Skin is warm. No rashes, no other new lesions, LE edema - none  Psychiatric: Pt behavior is normal without agitation   Micro: none  Cardiac tracings I have personally interpreted today:  none  Pertinent Radiological findings (summarize): none   Lab Results  Component Value Date   WBC 4.4 01/28/2023   HGB 13.5 01/28/2023   HCT 39.8 01/28/2023   PLT 163.0 01/28/2023   GLUCOSE 109 (H) 01/28/2023   CHOL 116 01/28/2023   TRIG 185.0 (H) 01/28/2023   HDL 40.00 01/28/2023   LDLDIRECT 90.0 10/15/2020   LDLCALC 39 01/28/2023   ALT 35 01/28/2023   AST 28 01/28/2023   NA 139 01/28/2023   K 3.6 01/28/2023   CL 99 01/28/2023   CREATININE 0.91 01/28/2023   BUN 17 01/28/2023   CO2 32 01/28/2023   TSH 0.89 01/28/2023   HGBA1C 6.6 (H) 01/28/2023   MICROALBUR 0.9 01/28/2023   Assessment/Plan:  AMBERLYNN STRADLING is a 72 y.o. White or Caucasian [1] female with  has a past medical history of Allergic rhinitis, cause unspecified (07/18/2011), Allergy, ANXIETY (06/23/2007), BREAST BIOPSY, HX OF (06/23/2007), Bursitis, Cataract, Chronic back pain, Chronic constipation, COLONIC POLYPS, HX OF (06/27/2008), COMMON MIGRAINE (06/23/2007), DEPRESSION (06/23/2007), Detached retina, GERD (06/23/2007), HYPERLIPIDEMIA (06/23/2007), Hypertension, Impaired glucose tolerance (02/16/2011), INSOMNIA-SLEEP DISORDER-UNSPEC (06/26/2009), Irritable bowel syndrome (06/23/2007), Microhematuria (07/31/2015), OSTEOARTHRITIS, LUMBAR SPINE (06/23/2007), OSTEOPENIA (07/11/2008), and Pre-diabetes.  Hyperlipidemia Lab Results  Component Value Date   LDLCALC 39 01/28/2023   Stable, pt to continue current statin lipitor 80 mg qd, zetai 10 mg qd   Impaired glucose tolerance Lab Results  Component Value Date   HGBA1C 6.6 (H) 01/28/2023   Stable, pt to continue current medical treatment  - diet, wt control   Hypertension BP Readings from Last 3 Encounters:  01/28/23 132/78  06/09/22 130/70  05/05/22 138/80   Stable, pt to continue medical treatment hct 12.5 qd, losartan 100 qd,  toprol xl 50 qd   GERD Uncontrolled, for change prilosec 20 to protonix 40 qd  Allergic rhinitis Mild to mod, for zyrtec 10 qd prn, nasacort asd,  to f/u any worsening symptoms or concerns  B12 deficiency Lab Results  Component Value Date   VITAMINB12 277 01/28/2023   Low, to start oral replacement - b12 1000 mcg qd   Followup: Return in about 6 months (around 07/31/2023).  Oliver Barre, MD 01/30/2023 8:33 PM [facility]

## 2023-01-28 NOTE — Patient Instructions (Signed)
Ok to change the prilosec to protonix 40 mg per day  Please also take OTC Zyrtec 10 mg and OTC Nasacort for sinus inflammation  Please call for ENT referral if not better in 1-2 wks  Please continue all other medications as before, and refills have been done if requested.  Please have the pharmacy call with any other refills you may need.  Please continue your efforts at being more active, low cholesterol diet, and weight control.  You are otherwise up to date with prevention measures today.  Please keep your appointments with your specialists as you may have planned  Please go to the LAB at the blood drawing area for the tests to be done  You will be contacted by phone if any changes need to be made immediately.  Otherwise, you will receive a letter about your results with an explanation, but please check with MyChart first.  Please remember to sign up for MyChart if you have not done so, as this will be important to you in the future with finding out test results, communicating by private email, and scheduling acute appointments online when needed.  Please make an Appointment to return in 6 months, or sooner if needed

## 2023-01-30 ENCOUNTER — Encounter: Payer: Self-pay | Admitting: Internal Medicine

## 2023-01-30 DIAGNOSIS — E538 Deficiency of other specified B group vitamins: Secondary | ICD-10-CM | POA: Insufficient documentation

## 2023-01-30 NOTE — Assessment & Plan Note (Signed)
Uncontrolled, for change prilosec 20 to protonix 40 qd

## 2023-01-30 NOTE — Assessment & Plan Note (Signed)
BP Readings from Last 3 Encounters:  01/28/23 132/78  06/09/22 130/70  05/05/22 138/80   Stable, pt to continue medical treatment hct 12.5 qd, losartan 100 qd, toprol xl 50 qd

## 2023-01-30 NOTE — Assessment & Plan Note (Signed)
Mild to mod, for zyrtec 10 qd prn, nasacort asd,  to f/u any worsening symptoms or concerns

## 2023-01-30 NOTE — Assessment & Plan Note (Signed)
Lab Results  Component Value Date   LDLCALC 39 01/28/2023   Stable, pt to continue current statin lipitor 80 mg qd, zetai 10 mg qd

## 2023-01-30 NOTE — Assessment & Plan Note (Signed)
Lab Results  Component Value Date   VITAMINB12 277 01/28/2023   Low, to start oral replacement - b12 1000 mcg qd

## 2023-01-30 NOTE — Assessment & Plan Note (Signed)
Lab Results  Component Value Date   HGBA1C 6.6 (H) 01/28/2023   Stable, pt to continue current medical treatment  - diet, wt control

## 2023-03-02 DIAGNOSIS — L812 Freckles: Secondary | ICD-10-CM | POA: Diagnosis not present

## 2023-03-02 DIAGNOSIS — L718 Other rosacea: Secondary | ICD-10-CM | POA: Diagnosis not present

## 2023-03-02 DIAGNOSIS — L578 Other skin changes due to chronic exposure to nonionizing radiation: Secondary | ICD-10-CM | POA: Diagnosis not present

## 2023-03-02 DIAGNOSIS — L821 Other seborrheic keratosis: Secondary | ICD-10-CM | POA: Diagnosis not present

## 2023-03-02 DIAGNOSIS — D1801 Hemangioma of skin and subcutaneous tissue: Secondary | ICD-10-CM | POA: Diagnosis not present

## 2023-03-18 DIAGNOSIS — R5383 Other fatigue: Secondary | ICD-10-CM | POA: Diagnosis not present

## 2023-03-18 DIAGNOSIS — N760 Acute vaginitis: Secondary | ICD-10-CM | POA: Diagnosis not present

## 2023-04-13 ENCOUNTER — Encounter: Payer: Self-pay | Admitting: Emergency Medicine

## 2023-06-01 ENCOUNTER — Ambulatory Visit (INDEPENDENT_AMBULATORY_CARE_PROVIDER_SITE_OTHER): Payer: Medicare Other | Admitting: Internal Medicine

## 2023-06-01 ENCOUNTER — Encounter: Payer: Self-pay | Admitting: Internal Medicine

## 2023-06-01 VITALS — BP 128/76 | HR 70 | Temp 98.2°F | Ht 65.0 in | Wt 180.0 lb

## 2023-06-01 DIAGNOSIS — Z23 Encounter for immunization: Secondary | ICD-10-CM | POA: Diagnosis not present

## 2023-06-01 DIAGNOSIS — M899 Disorder of bone, unspecified: Secondary | ICD-10-CM | POA: Diagnosis not present

## 2023-06-01 DIAGNOSIS — R7302 Impaired glucose tolerance (oral): Secondary | ICD-10-CM

## 2023-06-01 DIAGNOSIS — E2839 Other primary ovarian failure: Secondary | ICD-10-CM | POA: Diagnosis not present

## 2023-06-01 DIAGNOSIS — I10 Essential (primary) hypertension: Secondary | ICD-10-CM | POA: Diagnosis not present

## 2023-06-01 DIAGNOSIS — B3731 Acute candidiasis of vulva and vagina: Secondary | ICD-10-CM

## 2023-06-01 DIAGNOSIS — R232 Flushing: Secondary | ICD-10-CM | POA: Diagnosis not present

## 2023-06-01 DIAGNOSIS — M949 Disorder of cartilage, unspecified: Secondary | ICD-10-CM

## 2023-06-01 DIAGNOSIS — E538 Deficiency of other specified B group vitamins: Secondary | ICD-10-CM

## 2023-06-01 MED ORDER — METRONIDAZOLE 500 MG PO TABS: 500.0000 mg | ORAL_TABLET | Freq: Three times a day (TID) | ORAL | 0 refills | Status: AC

## 2023-06-01 MED ORDER — VEOZAH 45 MG PO TABS: 45.0000 mg | ORAL_TABLET | Freq: Every day | ORAL | 3 refills | Status: DC

## 2023-06-01 NOTE — Patient Instructions (Addendum)
Ok to change the tinidazole to Flagyl - this was sent to the pharmacy  Please take all new medication as prescribed -the Veozah if ok with the insurance  Please continue all other medications as before, and refills have been done if requested.  Please have the pharmacy call with any other refills you may need.  Please continue your efforts at being more active, low cholesterol diet, and weight control.  Please keep your appointments with your specialists as you may have planned  Please schedule the bone density test before leaving today at the scheduling desk (where you check out)  You will be contacted by phone if any changes need to be made immediately.  Otherwise, you will receive a letter about your results with an explanation, but please check with MyChart first.  Please make an Appointment to return in Nov 19 or sooner if needed

## 2023-06-02 ENCOUNTER — Other Ambulatory Visit (HOSPITAL_COMMUNITY): Payer: Self-pay

## 2023-06-05 ENCOUNTER — Encounter: Payer: Self-pay | Admitting: Internal Medicine

## 2023-06-05 DIAGNOSIS — B3731 Acute candidiasis of vulva and vagina: Secondary | ICD-10-CM | POA: Insufficient documentation

## 2023-06-05 DIAGNOSIS — R232 Flushing: Secondary | ICD-10-CM | POA: Insufficient documentation

## 2023-06-05 NOTE — Assessment & Plan Note (Signed)
BP Readings from Last 3 Encounters:  06/01/23 128/76  01/28/23 132/78  06/09/22 130/70   Stable, pt to continue medical treatment hct 12.5 every day, losartan 100 every day, lopressor 50 qd

## 2023-06-05 NOTE — Assessment & Plan Note (Signed)
Lab Results  Component Value Date   HGBA1C 6.6 (H) 01/28/2023   Stable, pt to continue current medical treatment  - diet, wt control

## 2023-06-05 NOTE — Assessment & Plan Note (Signed)
Also for f/u dxa

## 2023-06-05 NOTE — Assessment & Plan Note (Signed)
Lab Results  Component Value Date   VITAMINB12 277 01/28/2023   Low, to start oral replacement - b12 1000 mcg qd

## 2023-06-05 NOTE — Assessment & Plan Note (Signed)
Ok for veozah 45 every day

## 2023-06-05 NOTE — Assessment & Plan Note (Signed)
For dxa

## 2023-06-05 NOTE — Assessment & Plan Note (Signed)
Pt for change to metronidazole course asd

## 2023-06-08 ENCOUNTER — Ambulatory Visit (INDEPENDENT_AMBULATORY_CARE_PROVIDER_SITE_OTHER)
Admission: RE | Admit: 2023-06-08 | Discharge: 2023-06-08 | Disposition: A | Discharge status: Home / Self Care | Payer: Medicare Other | Source: Ambulatory Visit | Attending: Internal Medicine | Admitting: Internal Medicine

## 2023-06-08 ENCOUNTER — Inpatient Hospital Stay: Admission: RE | Admit: 2023-06-08 | Payer: Medicare Other | Source: Ambulatory Visit

## 2023-06-08 DIAGNOSIS — E2839 Other primary ovarian failure: Secondary | ICD-10-CM

## 2023-06-22 ENCOUNTER — Telehealth: Payer: Self-pay

## 2023-06-22 ENCOUNTER — Other Ambulatory Visit (HOSPITAL_COMMUNITY): Payer: Self-pay

## 2023-06-22 NOTE — Telephone Encounter (Signed)
Pharmacy Patient Advocate Encounter   Received notification from CoverMyMeds that prior authorization for Veozah 45MG  tablets is required/requested.   Insurance verification completed.   The patient is insured through [employer] .   Per test claim: PA required; PA submitted to [facility] via CoverMyMeds Key/confirmation #/[id number] Status is pending

## 2023-06-24 DIAGNOSIS — M47816 Spondylosis without myelopathy or radiculopathy, lumbar region: Secondary | ICD-10-CM | POA: Diagnosis not present

## 2023-06-25 NOTE — Telephone Encounter (Signed)
Pharmacy Patient Advocate Encounter  Received notification from [facility] that Prior Authorization for Veozah 45MG  tablets has been DENIED.  Full denial letter will be uploaded to the media tab. See denial reason below.  The drug you asked for is not listed in your preferred drug list (formulary). The preferred drug(s), you may not have tried, are: estradiol tablet, estradiol-norethindrone acet tablet, venlafaxine er capsule extended release 24 hr. Your provider needs to give [place] medical reasons why the preferred drug(s0 would not work for you and/or would have bad side effects. Sometimes a preferred drug needs more review for approval. Additionally, some preferred drugs listed may be the same drugs with different strengths or forms.   PA #/Case ID/Reference #: [id number]  Please be advised we currently do not have a Pharmacist to review denials, therefore you will need to process appeals accordingly as needed. Thanks for your support at this time. Contact for appeals are as follows: Phone: [phone], Fax: [phone]  Last day to appeal is 08-22-2023

## 2023-07-27 ENCOUNTER — Ambulatory Visit: Payer: Medicare Other | Admitting: Internal Medicine

## 2023-07-27 ENCOUNTER — Ambulatory Visit: Payer: Medicare Other | Attending: Internal Medicine | Admitting: Internal Medicine

## 2023-07-27 ENCOUNTER — Encounter: Payer: Self-pay | Admitting: Internal Medicine

## 2023-07-27 VITALS — BP 118/78 | HR 69 | Ht 65.0 in | Wt 181.0 lb

## 2023-07-27 DIAGNOSIS — I2510 Atherosclerotic heart disease of native coronary artery without angina pectoris: Secondary | ICD-10-CM | POA: Insufficient documentation

## 2023-07-27 DIAGNOSIS — I700 Atherosclerosis of aorta: Secondary | ICD-10-CM | POA: Insufficient documentation

## 2023-07-27 DIAGNOSIS — E785 Hyperlipidemia, unspecified: Secondary | ICD-10-CM | POA: Insufficient documentation

## 2023-07-27 DIAGNOSIS — R0989 Other specified symptoms and signs involving the circulatory and respiratory systems: Secondary | ICD-10-CM | POA: Diagnosis not present

## 2023-07-27 NOTE — Patient Instructions (Signed)
Medication Instructions:  Your physician recommends that you continue on your current medications as directed. Please refer to the Current Medication list given to you today.  *If you need a refill on your cardiac medications before your next appointment, please call your pharmacy*   Lab Work: NONE If you have labs (blood work) drawn today and your tests are completely normal, you will receive your results only by: [place] (if you have MyChart) OR A paper copy in the mail If you have any lab test that is abnormal or we need to change your treatment, we will call you to review the results.   Testing/Procedures: NONE   Follow-Up: At [facility], you and your health needs are our priority.  As part of our continuing mission to provide you with exceptional heart care, we have created designated Provider Care Teams.  These Care Teams include your primary Cardiologist (physician) and Advanced Practice Providers (APPs -  Physician Assistants and Nurse Practitioners) who all work together to provide you with the care you need, when you need it.  We recommend signing up for the patient portal called "MyChart".  Sign up information is provided on this After Visit Summary.  MyChart is used to connect with patients for Virtual Visits (Telemedicine).  Patients are able to view lab/test results, encounter notes, upcoming appointments, etc.  Non-urgent messages can be sent to your provider as well.   To learn more about what you can do with MyChart, go to [email or web address].    Your next appointment:   1 year(s)  Provider:   Rudean Haskell, MD

## 2023-07-27 NOTE — Progress Notes (Signed)
Cardiology Office Note:    Date:  07/27/2023   ID:  Molly, Lewis 02/04/1951, MRN [id number]  PCP:  Corwin Levins, MD   [facility] Providers Cardiologist:  Christell Constant, MD     CC: Secondary prevention  History of Present Illness:    Molly Lewis is a 72 y.o. female with a hx of HTN, HLD, CAC, and Aortic Atherosclerosis  seen in 2022.  Had negative stress test and LDL was improving at last visit, BMP increased. 2023: New carotid bruit mild Left carotid stenosis  Discussed the use of AI scribe software for clinical note transcription with the patient, who gave verbal consent to proceed.  Molly Lewis, a 72 year old individual with a history of hypertension, hyperlipidemia, and coronary artery calcifications, was doing well earlier in the year. She had stopped her losartan, and her shortness of breath had resolved. She was very active, working a couple of jobs. She had been practicing mindfulness meditation techniques for anxiety and blood pressure management.  Last year, a carotid bruit was noted during a consultation with Molly Carson PA-C. A subsequent carotid duplex revealed minimal flow disruption of the left internal carotid artery (ICA). A repeat duplex is scheduled in three years.  The patient reported feeling generally well, with some good and bad days. She remained active, working three to four days a week. She occasionally experienced shortness of breath and infrequent dizzy spells, but denied any chest pain or pressure. She did not feel these symptoms were limiting her activities.  The patient was on aspirin for coronary artery calcifications and had a history of high blood pressure, which was well controlled on her current regimen. She also had mild left carotid stenosis. Despite these conditions, the patient remained active and was managing her symptoms effectively.   Past Medical History:  Diagnosis Date   Allergic rhinitis, cause unspecified  07/18/2011   Allergy    ANXIETY 06/23/2007   BREAST BIOPSY, HX OF 06/23/2007   benign   Bursitis    Cataract    bilateral lens implants   Chronic back pain    Chronic constipation    "i have had it all my life and the last time i had a colonoscopy they tol me i have a distened colon because of it"    COLONIC POLYPS, HX OF 06/27/2008   COMMON MIGRAINE 06/23/2007   occasional   DEPRESSION 06/23/2007   Detached retina    GERD 06/23/2007   HYPERLIPIDEMIA 06/23/2007   Hypertension    Impaired glucose tolerance 02/16/2011   INSOMNIA-SLEEP DISORDER-UNSPEC 06/26/2009   Irritable bowel syndrome 06/23/2007   diet controlled   Microhematuria 07/31/2015   OSTEOARTHRITIS, LUMBAR SPINE 06/23/2007   OSTEOPENIA 07/11/2008   Pre-diabetes     Past Surgical History:  Procedure Laterality Date   APPENDECTOMY     BIOPSY  09/19/2020   Procedure: BIOPSY;  Surgeon: Lemar Lofty., MD;  Location: [facility] ENDOSCOPY;  Service: Gastroenterology;;   CATARACT EXTRACTION     BIL   colonoscopy  02/2019   COLONOSCOPY  09/2013, 07/2008, 08/2003, 07/1999   multiple   COLONOSCOPY WITH PROPOFOL N/A 03/29/2019   Procedure: COLONOSCOPY WITH PROPOFOL;  Surgeon: Lemar Lofty., MD;  Location: [facility] ENDOSCOPY;  Service: Gastroenterology;  Laterality: N/A;   COLONOSCOPY WITH PROPOFOL N/A 09/19/2020   Procedure: COLONOSCOPY WITH PROPOFOL;  Surgeon: Meridee Score Netty Starring., MD;  Location: [facility] ENDOSCOPY;  Service: Gastroenterology;  Laterality: N/A;   ENDOSCOPIC MUCOSAL RESECTION N/A 03/29/2019  Procedure: ENDOSCOPIC MUCOSAL RESECTION;  Surgeon: Meridee Score Netty Starring., MD;  Location: [facility] ENDOSCOPY;  Service: Gastroenterology;  Laterality: N/A;   HEMOSTASIS CLIP PLACEMENT  03/29/2019   Procedure: HEMOSTASIS CLIP PLACEMENT;  Surgeon: Lemar Lofty., MD;  Location: [facility] ENDOSCOPY;  Service: Gastroenterology;;   POLYPECTOMY  09/19/2020   Procedure: POLYPECTOMY;  Surgeon: Lemar Lofty., MD;  Location:  [facility] ENDOSCOPY;  Service: Gastroenterology;;   REPLACEMENT TOTAL KNEE Right 04/2020   RETINAL DETACHMENT SURGERY Right 01/2019   RETINAL TEAR REPAIR CRYOTHERAPY Right    SUBMUCOSAL LIFTING INJECTION  03/29/2019   Procedure: SUBMUCOSAL LIFTING INJECTION;  Surgeon: Lemar Lofty., MD;  Location: [facility] ENDOSCOPY;  Service: Gastroenterology;;   TOTAL ABDOMINAL HYSTERECTOMY     UPPER GASTROINTESTINAL ENDOSCOPY  10/2003   WISDOM TOOTH EXTRACTION      Current Medications: Current Meds  Medication Sig   ALPRAZolam (XANAX) 1 MG tablet TAKE 1 TABLET BY MOUTH TWICE DAILY AS NEEDED   ASPIRIN LOW DOSE 81 MG tablet TAKE 1 TABLET(81 MG) BY MOUTH DAILY. SWALLOW WHOLE   atorvastatin (LIPITOR) 80 MG tablet Take 1 tablet (80 mg total) by mouth daily. Pt will need to see provider for future refills   Bacillus Coagulans-Inulin (PROBIOTIC-PREBIOTIC PO) Take 1 capsule by mouth daily.   Calcium-Magnesium-Vitamin D (CALCIUM 1200+D3 PO) Take 1 tablet by mouth daily.   celecoxib (CELEBREX) 200 MG capsule TAKE 1 CAPSULE(200 MG) BY MOUTH TWICE DAILY AS NEEDED FOR MILD PAIN OR MODERATE PAIN   citalopram (CELEXA) 20 MG tablet Take 1 tablet (20 mg total) by mouth daily.   cyclobenzaprine (FLEXERIL) 5 MG tablet Take 1 tablet (5 mg total) by mouth 3 (three) times daily as needed for muscle spasms.   ezetimibe (ZETIA) 10 MG tablet TAKE 1 TABLET(10 MG) BY MOUTH DAILY   hydrochlorothiazide (MICROZIDE) 12.5 MG capsule Take 1 capsule (12.5 mg total) by mouth daily.   HYDROcodone-acetaminophen (NORCO/VICODIN) 5-325 MG tablet TAKE 1 TABLET BY MOUTH THREE TIMES DAILY FOR 5 DAYS AS NEEDED   HYDROmorphone (DILAUDID) 2 MG tablet Take 2 mg by mouth 3 (three) times daily as needed.   losartan (COZAAR) 100 MG tablet TAKE 1 TABLET(100 MG) BY MOUTH DAILY   metoprolol succinate (TOPROL-XL) 50 MG 24 hr tablet Take 1 tablet (50 mg total) by mouth daily. Take with or immediately following a meal.   metroNIDAZOLE (METROGEL) 0.75 % gel  Apply 1 Application topically 2 (two) times daily.   Multiple Vitamin (MULTIVITAMIN WITH MINERALS) TABS tablet Take 1 tablet by mouth daily.   pantoprazole (PROTONIX) 40 MG tablet Take 1 tablet (40 mg total) by mouth daily.   tretinoin (RETIN-A) 0.025 % cream Apply topically every other day.     Allergies:   Codeine, Oxycodone, and Prevnar 20 [pneumococcal 20-val conj vacc]   Social History   Socioeconomic History   Marital status: Married    Spouse name: Not on file   Number of children: 2   Years of education: Not on file   Highest education level: Not on file  Occupational History   Not on file  Tobacco Use   Smoking status: Former    Current packs/day: 0.00    Average packs/day: 1 pack/day for 35.0 years (35.0 ttl pk-yrs)    Types: Cigarettes    Start date: 04/16/1974    Quit date: 04/16/2009    Years since quitting: 14.2   Smokeless tobacco: Never   Tobacco comments:    Quit smoking 04/15/09  Vaping Use   Vaping  status: Never Used  Substance and Sexual Activity   Alcohol use: Not Currently    Comment: occasional   Drug use: No   Sexual activity: Yes    Birth control/protection: Surgical    Comment: Hysterectomy  Other Topics Concern   Not on file  Social History Narrative   Not on file   Social Determinants of Health   Financial Resource Strain: Not on file  Food Insecurity: Not on file  Transportation Needs: Not on file  Physical Activity: Not on file  Stress: Not on file  Social Connections: Not on file    Social: back to work after retirement  Family History: The patient's family history includes Brain cancer in her maternal grandmother and mother; Heart disease in her father; Hypertension in her father. There is no history of Colon cancer, Esophageal cancer, Rectal cancer, or Stomach cancer. History of coronary artery disease notable for father needing CABG. History of heart failure notable for no members. History of arrhythmia notable for no  members Brother has FH.  ROS:     EKGs/Labs/Other Studies Reviewed:    The following studies were reviewed today:  EKG:   02/04/21: Sr rate 63 borderline ant inf pattern  Cardiac Studies & Procedures     STRESS TESTS  MYOCARDIAL PERFUSION IMAGING 02/24/2021  Narrative  Nuclear stress EF: 65%. The left ventricular ejection fraction is normal (55-65%).  This is a low risk study. There is no evidence of ischemia and no evidence of previous infarction  The study is normal.      CT SCANS  CT CARDIAC SCORING (SELF PAY ONLY) 12/10/2020  Addendum 12/10/2020  9:23 AM ADDENDUM REPORT: 12/10/2020 09:20  CLINICAL DATA:  Cardiovascular Disease Risk stratification  EXAM: Coronary Calcium Score  TECHNIQUE: A gated, non-contrast computed tomography scan of the heart was performed using 3mm slice thickness. Axial images were analyzed on a dedicated workstation. Calcium scoring of the coronary arteries was performed using the Agatston method.  FINDINGS: Coronary arteries: Normal origins.  Coronary Calcium Score:  Left main: 0  Left anterior descending artery: 55.3  Left circumflex artery: 46.8  Right coronary artery: 144  Total: 246  Percentile: 88 th  Pericardium: Normal.  Ascending Aorta: Normal caliber.  3.7 cm  Non-cardiac: See separate report from [facility].  IMPRESSION: Coronary calcium score of 246. This was 23 th percentile for age-, race-, and sex-matched controls.  RECOMMENDATIONS: Coronary artery calcium (CAC) score is a strong predictor of incident coronary heart disease (CHD) and provides predictive information beyond traditional risk factors. CAC scoring is reasonable to use in the decision to withhold, postpone, or initiate statin therapy in intermediate-risk or selected borderline-risk asymptomatic adults (age 18-75 years and LDL-C >=70 to <190 mg/dL) who do not have diabetes or established atherosclerotic cardiovascular disease  (ASCVD).* In intermediate-risk (10-year ASCVD risk >=7.5% to <20%) adults or selected borderline-risk (10-year ASCVD risk >=5% to <7.5%) adults in whom a CAC score is measured for the purpose of making a treatment decision the following recommendations have been made:  If CAC=0, it is reasonable to withhold statin therapy and reassess in 5 to 10 years, as long as higher risk conditions are absent (diabetes mellitus, family history of premature CHD in first degree relatives (males <55 years; females <65 years), cigarette smoking, or LDL >=190 mg/dL).  If CAC is 1 to 99, it is reasonable to initiate statin therapy for patients >=61 years of age.  If CAC is >=100 or >=75th percentile, it is reasonable  to initiate statin therapy at any age.  Cardiology referral should be considered for patients with CAC scores >=400 or >=75th percentile.  *2018 AHA/ACC/AACVPR/AAPA/ABC/ACPM/ADA/AGS/APhA/ASPC/NLA/PCNA Guideline on the Management of Blood Cholesterol: A Report of the American College of Cardiology/American Heart Association Task Force on Clinical Practice Guidelines. J Am Coll Cardiol. 2019;73(24):3168-3209.  Charlton Haws, MD   Electronically Signed By: Charlton Haws M.D. On: 12/10/2020 09:20  Narrative EXAM: OVER-READ INTERPRETATION  CT CHEST  The following report is an over-read performed by radiologist Dr. Irish Lack of [facility] on 12/10/2020. This over-read does not include interpretation of cardiac or coronary anatomy or pathology. The coronary calcium score interpretation by the cardiologist is attached.  COMPARISON:  None.  FINDINGS: Vascular: Calcified plaque is visualized in the thoracic aorta. Trace pericardial fluid.  Mediastinum/Nodes: Visualized mediastinum and hilar regions demonstrate no lymphadenopathy or masses.  Lungs/Pleura: Lungs demonstrate some scattered tiny subpleural pulmonary nodules bilaterally. The most discrete are 2 and 3  mm left upper lobe nodules and a 2 mm right lower lobe nodule. There also is suggestion of some scattered even smaller vague tiny subpleural nodules elsewhere, including the left lower lobe. All of these small subpleural nodules have the appearance of likely postinflammatory nodules. Visualized lungs show no evidence of pulmonary edema, consolidation, pneumothorax or pleural fluid.  Upper Abdomen: No acute abnormality.  Musculoskeletal: No chest wall mass or suspicious bone lesions identified.  IMPRESSION: 1. Scattered tiny subpleural pulmonary nodules bilaterally. All of these nodules have the appearance of likely postinflammatory nodules. No follow-up needed if patient is low-risk (and has no known or suspected primary neoplasm). Non-contrast chest CT can be considered in 12 months if patient is high-risk. This recommendation follows the consensus statement: Guidelines for Management of Incidental Pulmonary Nodules Detected on CT Images: From the Fleischner Society 2017; Radiology 2017; 284:228-243. 2. Calcified plaque in the thoracic aorta. 3. Trace pericardial fluid.  Aortic Atherosclerosis (ICD10-I70.0).  Electronically Signed: By: Irish Lack M.D. On: 12/10/2020 08:27            Recent Labs: 01/28/2023: ALT 35; BUN 17; Creatinine, Ser 0.91; Hemoglobin 13.5; Platelets 163.0; Potassium 3.6; Sodium 139; TSH 0.89  Recent Lipid Panel    Component Value Date/Time   CHOL 116 01/28/2023 1032   CHOL 124 05/13/2021 0733   TRIG 185.0 (H) 01/28/2023 1032   HDL 40.00 01/28/2023 1032   HDL 42 05/13/2021 0733   CHOLHDL 3 01/28/2023 1032   VLDL 37.0 01/28/2023 1032   LDLCALC 39 01/28/2023 1032   LDLCALC 55 05/13/2021 0733   LDLDIRECT 90.0 10/15/2020 0942    Physical Exam:    VS:  BP 118/78   Pulse 69   Ht 5\' 5"  (1.651 m)   Wt 181 lb (82.1 kg)   LMP  (LMP Unknown)   SpO2 94%   BMI 30.12 kg/m     Wt Readings from Last 3 Encounters:  07/27/23 181 lb (82.1 kg)   06/01/23 180 lb (81.6 kg)  01/28/23 182 lb (82.6 kg)    Gen: No distress   Neck: No JVD, left sided carotid bruit Cardiac: No Rubs or Gallops, no murmur, RRR +2 radial pulses Respiratory: Clear to auscultation bilaterally, normal effort, normal respiratory rate GI: Soft, nontender, non-distended  MS: No edema;  moves all extremities Integument: Skin feels well Neuro:  At time of evaluation, alert and oriented to person/place/time/situation  Psych: calm affect and mood   ASSESSMENT:    1. Bilateral carotid bruits   2.  Aortic atherosclerosis (HCC)   3. Coronary artery calcification   4. Hyperlipidemia, unspecified hyperlipidemia type     PLAN:    Pulmonary nodule - found in 2022; no further imaging recommended in March 2023 (thought to be post inflammatory by radiology - further work up as per PCP  Coronary Artery Calcifications Currently asymptomatic with well-controlled cholesterol levels. Aspirin therapy is continued for prevention of further calcium buildup and cardiovascular events.  - Low threshold for stress test if symptoms worsen  Carotid Artery Stenosis - Mild left carotid stenosis noted on carotid duplex in October 2023. Minimal flow in the left ICA. No current symptoms. Low likelihood of progression. Plan to repeat imaging in three years. Patient agrees. - Repeat carotid duplex in October 2026  Hypertension Well-controlled on current regimen. Blood pressure readings are within normal limits. Discussed mindfulness meditation for anxiety and blood pressure management. - Continue current antihypertensive regimen - Rediscussed mindfulness meditation  Hyperlipidemia LDL levels under 55 on current therapy. No need for more aggressive management. Patient prefers to maintain current therapy. - Continue current lipid-lowering therapy  General Health Maintenance Engages in regular physical activity. Discussed mindfulness meditation for anxiety and blood pressure  management. - Continue mindfulness meditation   Follow-up - Schedule follow-up visit in one year   Medication Adjustments/Labs and Tests Ordered: Current medicines are reviewed at length with the patient today.  Concerns regarding medicines are outlined above.  Orders Placed This Encounter  Procedures   EKG 12-Lead    No orders of the defined types were placed in this encounter.    There are no Patient Instructions on file for this visit.   Signed, Christell Constant, MD  07/27/2023 10:25 AM    [facility]

## 2023-08-10 ENCOUNTER — Ambulatory Visit: Payer: Medicare Other | Admitting: Internal Medicine

## 2023-08-19 ENCOUNTER — Ambulatory Visit (INDEPENDENT_AMBULATORY_CARE_PROVIDER_SITE_OTHER): Payer: Medicare Other | Admitting: Internal Medicine

## 2023-08-19 ENCOUNTER — Encounter: Payer: Self-pay | Admitting: Internal Medicine

## 2023-08-19 VITALS — BP 128/84 | HR 64 | Temp 98.7°F | Ht 65.0 in | Wt 182.0 lb

## 2023-08-19 DIAGNOSIS — I10 Essential (primary) hypertension: Secondary | ICD-10-CM | POA: Diagnosis not present

## 2023-08-19 DIAGNOSIS — J309 Allergic rhinitis, unspecified: Secondary | ICD-10-CM

## 2023-08-19 DIAGNOSIS — E559 Vitamin D deficiency, unspecified: Secondary | ICD-10-CM | POA: Diagnosis not present

## 2023-08-19 DIAGNOSIS — E1165 Type 2 diabetes mellitus with hyperglycemia: Secondary | ICD-10-CM

## 2023-08-19 DIAGNOSIS — R7302 Impaired glucose tolerance (oral): Secondary | ICD-10-CM

## 2023-08-19 DIAGNOSIS — Z7985 Long-term (current) use of injectable non-insulin antidiabetic drugs: Secondary | ICD-10-CM

## 2023-08-19 DIAGNOSIS — E7800 Pure hypercholesterolemia, unspecified: Secondary | ICD-10-CM

## 2023-08-19 DIAGNOSIS — E538 Deficiency of other specified B group vitamins: Secondary | ICD-10-CM | POA: Diagnosis not present

## 2023-08-19 MED ORDER — TIRZEPATIDE 2.5 MG/0.5ML ~~LOC~~ SOAJ: 2.5000 mg | SUBCUTANEOUS | 11 refills | Status: DC

## 2023-08-19 NOTE — Patient Instructions (Addendum)
MyChart Username CARTER[facility]  Please take all new medication as prescribed - the mounjaro 2.5 mg weekly (but call in 1 mo for the higher dose if you are taking this ok)  Please continue all other medications as before, and refills have been done if requested.  Please have the pharmacy call with any other refills you may need.  Please continue your efforts at being more active, low cholesterol diet, and weight control.  Please keep your appointments with your specialists as you may have planned  Please make an Appointment to return in 6 months, or sooner if needed, also with Lab Appointment for testing done 3-5 days before at the FIRST FLOOR Lab (so this is for TWO appointments - please see the scheduling desk as you leave)

## 2023-08-19 NOTE — Progress Notes (Signed)
Patient ID: Molly Lewis, female   DOB: 12-02-1950, 72 y.o.   MRN: [id number]        Chief Complaint: follow up HTN, HLD and DM, allergies       HPI:  Molly Lewis is a 72 y.o. female here overall doing ok, Pt denies chest pain, increased sob or doe, wheezing, orthopnea, PND, increased LE swelling, palpitations, dizziness or syncope.   Pt denies polydipsia, polyuria, or new focal neuro s/s.    Pt denies fever, wt loss, night sweats, loss of appetite, or other constitutional symptoms  Does have several wks ongoing nasal allergy symptoms with clearish congestion, itch and sneezing, without fever, pain, ST, cough, swelling or wheezing.     Wt Readings from Last 3 Encounters:  08/19/23 182 lb (82.6 kg)  07/27/23 181 lb (82.1 kg)  06/01/23 180 lb (81.6 kg)   BP Readings from Last 3 Encounters:  08/19/23 128/84  07/27/23 118/78  06/01/23 128/76         Past Medical History:  Diagnosis Date   Allergic rhinitis, cause unspecified 07/18/2011   Allergy    ANXIETY 06/23/2007   BREAST BIOPSY, HX OF 06/23/2007   benign   Bursitis    Cataract    bilateral lens implants   Chronic back pain    Chronic constipation    "i have had it all my life and the last time i had a colonoscopy they tol me i have a distened colon because of it"    COLONIC POLYPS, HX OF 06/27/2008   COMMON MIGRAINE 06/23/2007   occasional   DEPRESSION 06/23/2007   Detached retina    GERD 06/23/2007   HYPERLIPIDEMIA 06/23/2007   Hypertension    Impaired glucose tolerance 02/16/2011   INSOMNIA-SLEEP DISORDER-UNSPEC 06/26/2009   Irritable bowel syndrome 06/23/2007   diet controlled   Microhematuria 07/31/2015   OSTEOARTHRITIS, LUMBAR SPINE 06/23/2007   OSTEOPENIA 07/11/2008   Pre-diabetes    Past Surgical History:  Procedure Laterality Date   APPENDECTOMY     BIOPSY  09/19/2020   Procedure: BIOPSY;  Surgeon: Lemar Lofty., MD;  Location: [facility] ENDOSCOPY;  Service: Gastroenterology;;   CATARACT EXTRACTION      BIL   colonoscopy  02/2019   COLONOSCOPY  09/2013, 07/2008, 08/2003, 07/1999   multiple   COLONOSCOPY WITH PROPOFOL N/A 03/29/2019   Procedure: COLONOSCOPY WITH PROPOFOL;  Surgeon: Lemar Lofty., MD;  Location: [facility] ENDOSCOPY;  Service: Gastroenterology;  Laterality: N/A;   COLONOSCOPY WITH PROPOFOL N/A 09/19/2020   Procedure: COLONOSCOPY WITH PROPOFOL;  Surgeon: Meridee Score Netty Starring., MD;  Location: [facility] ENDOSCOPY;  Service: Gastroenterology;  Laterality: N/A;   ENDOSCOPIC MUCOSAL RESECTION N/A 03/29/2019   Procedure: ENDOSCOPIC MUCOSAL RESECTION;  Surgeon: Meridee Score Netty Starring., MD;  Location: [facility] ENDOSCOPY;  Service: Gastroenterology;  Laterality: N/A;   HEMOSTASIS CLIP PLACEMENT  03/29/2019   Procedure: HEMOSTASIS CLIP PLACEMENT;  Surgeon: Lemar Lofty., MD;  Location: [facility] ENDOSCOPY;  Service: Gastroenterology;;   POLYPECTOMY  09/19/2020   Procedure: POLYPECTOMY;  Surgeon: Lemar Lofty., MD;  Location[facility] ENDOSCOPY;  Service: Gastroenterology;;   REPLACEMENT TOTAL KNEE Right 04/2020   RETINAL DETACHMENT SURGERY Right 01/2019   RETINAL TEAR REPAIR CRYOTHERAPY Right    SUBMUCOSAL LIFTING INJECTION  03/29/2019   Procedure: SUBMUCOSAL LIFTING INJECTION;  Surgeon: Lemar Lofty., MD;  Location: [facility] ENDOSCOPY;  Service: Gastroenterology;;   TOTAL ABDOMINAL HYSTERECTOMY     UPPER GASTROINTESTINAL ENDOSCOPY  10/2003   WISDOM TOOTH EXTRACTION  reports that she quit smoking about 14 years ago. Her smoking use included cigarettes. She started smoking about 49 years ago. She has a 35 pack-year smoking history. She has never used smokeless tobacco. She reports that she does not currently use alcohol. She reports that she does not use drugs. family history includes Brain cancer in her maternal grandmother and mother; Heart disease in her father; Hypertension in her father. Allergies  Allergen Reactions   Codeine Nausea And Vomiting    Violent vomiting    Oxycodone    Prevnar 20 [Pneumococcal 20-Val Conj Vacc] Other (See Comments)    Rash to torso   Current Outpatient Medications on File Prior to Visit  Medication Sig Dispense Refill   ALPRAZolam (XANAX) 1 MG tablet TAKE 1 TABLET BY MOUTH TWICE DAILY AS NEEDED 60 tablet 2   ASPIRIN LOW DOSE 81 MG tablet TAKE 1 TABLET(81 MG) BY MOUTH DAILY. SWALLOW WHOLE 90 tablet 3   atorvastatin (LIPITOR) 80 MG tablet Take 1 tablet (80 mg total) by mouth daily. Pt will need to see provider for future refills 90 tablet 3   Bacillus Coagulans-Inulin (PROBIOTIC-PREBIOTIC PO) Take 1 capsule by mouth daily.     Calcium-Magnesium-Vitamin D (CALCIUM 1200+D3 PO) Take 1 tablet by mouth daily.     celecoxib (CELEBREX) 200 MG capsule TAKE 1 CAPSULE(200 MG) BY MOUTH TWICE DAILY AS NEEDED FOR MILD PAIN OR MODERATE PAIN 180 capsule 3   citalopram (CELEXA) 20 MG tablet Take 1 tablet (20 mg total) by mouth daily. 90 tablet 3   cyclobenzaprine (FLEXERIL) 5 MG tablet Take 1 tablet (5 mg total) by mouth 3 (three) times daily as needed for muscle spasms. 60 tablet 1   ezetimibe (ZETIA) 10 MG tablet TAKE 1 TABLET(10 MG) BY MOUTH DAILY 90 tablet 3   hydrochlorothiazide (MICROZIDE) 12.5 MG capsule Take 1 capsule (12.5 mg total) by mouth daily. 90 capsule 3   HYDROcodone-acetaminophen (NORCO/VICODIN) 5-325 MG tablet TAKE 1 TABLET BY MOUTH THREE TIMES DAILY FOR 5 DAYS AS NEEDED     HYDROmorphone (DILAUDID) 2 MG tablet Take 2 mg by mouth 3 (three) times daily as needed.     losartan (COZAAR) 100 MG tablet TAKE 1 TABLET(100 MG) BY MOUTH DAILY 90 tablet 3   metoprolol succinate (TOPROL-XL) 50 MG 24 hr tablet Take 1 tablet (50 mg total) by mouth daily. Take with or immediately following a meal. 90 tablet 3   metroNIDAZOLE (METROGEL) 0.75 % gel Apply 1 Application topically 2 (two) times daily. 45 g 2   Multiple Vitamin (MULTIVITAMIN WITH MINERALS) TABS tablet Take 1 tablet by mouth daily.     pantoprazole (PROTONIX) 40 MG tablet Take 1  tablet (40 mg total) by mouth daily. 90 tablet 3   tretinoin (RETIN-A) 0.025 % cream Apply topically every other day.     No current facility-administered medications on file prior to visit.        ROS:  All others reviewed and negative.  Objective        PE:  BP 128/84 (BP Location: Right Arm, Patient Position: Sitting, Cuff Size: Normal)   Pulse 64   Temp 98.7 F (37.1 C) (Oral)   Ht 5\' 5"  (1.651 m)   Wt 182 lb (82.6 kg)   LMP  (LMP Unknown)   SpO2 99%   BMI 30.29 kg/m                 Constitutional: Pt appears in NAD  HENT: Head: NCAT.                Right Ear: External ear normal.                 Left Ear: External ear normal.Bilat tm's with mild erythema.  Max sinus areas non tender.  Pharynx with mild erythema, no exudate                Eyes: . Pupils are equal, round, and reactive to light. Conjunctivae and EOM are normal               Nose: without d/c or deformity               Neck: Neck supple. Gross normal ROM               Cardiovascular: Normal rate and regular rhythm.                 Pulmonary/Chest: Effort normal and breath sounds without rales or wheezing.                Abd:  Soft, NT, ND, + BS, no organomegaly               Neurological: Pt is alert. At baseline orientation, motor grossly intact               Skin: Skin is warm. No rashes, no other new lesions, LE edema - none               Psychiatric: Pt behavior is normal without agitation   Micro: none  Cardiac tracings I have personally interpreted today:  none  Pertinent Radiological findings (summarize): none   Lab Results  Component Value Date   WBC 4.4 01/28/2023   HGB 13.5 01/28/2023   HCT 39.8 01/28/2023   PLT 163.0 01/28/2023   GLUCOSE 109 (H) 01/28/2023   CHOL 116 01/28/2023   TRIG 185.0 (H) 01/28/2023   HDL 40.00 01/28/2023   LDLDIRECT 90.0 10/15/2020   LDLCALC 39 01/28/2023   ALT 35 01/28/2023   AST 28 01/28/2023   NA 139 01/28/2023   K 3.6 01/28/2023   CL 99  01/28/2023   CREATININE 0.91 01/28/2023   BUN 17 01/28/2023   CO2 32 01/28/2023   TSH 0.89 01/28/2023   HGBA1C 6.6 (H) 01/28/2023   MICROALBUR 0.9 01/28/2023   Assessment/Plan:  Molly Lewis is a 72 y.o. White or Caucasian [1] female with  has a past medical history of Allergic rhinitis, cause unspecified (07/18/2011), Allergy, ANXIETY (06/23/2007), BREAST BIOPSY, HX OF (06/23/2007), Bursitis, Cataract, Chronic back pain, Chronic constipation, COLONIC POLYPS, HX OF (06/27/2008), COMMON MIGRAINE (06/23/2007), DEPRESSION (06/23/2007), Detached retina, GERD (06/23/2007), HYPERLIPIDEMIA (06/23/2007), Hypertension, Impaired glucose tolerance (02/16/2011), INSOMNIA-SLEEP DISORDER-UNSPEC (06/26/2009), Irritable bowel syndrome (06/23/2007), Microhematuria (07/31/2015), OSTEOARTHRITIS, LUMBAR SPINE (06/23/2007), OSTEOPENIA (07/11/2008), and Pre-diabetes.  Hypertension BP Readings from Last 3 Encounters:  08/19/23 128/84  07/27/23 118/78  06/01/23 128/76   Stable, pt to continue medical treatment hct 12.5 every day, losartan 100 every day, toprol xl 50 qd   Hyperlipidemia Lab Results  Component Value Date   LDLCALC 39 01/28/2023   Stable, pt to continue current statin lipitor 80 qd   Diabetes (HCC) Lab Results  Component Value Date   HGBA1C 6.6 (H) 01/28/2023   New onset uncontrolled,, pt to start mounjaro 2.5 mg qwk   B12 deficiency Lab Results  Component Value Date   VITAMINB12 277 01/28/2023   Los  to start oral replacement - b12 1000 mcg qd   Allergic rhinitis Mild to mod, for allegra 180 every day prn,  to f/u any worsening symptoms or concerns  Followup: Return in about 6 months (around 02/17/2024).  Oliver Barre, MD 08/22/2023 5:06 PM [facility]

## 2023-08-22 ENCOUNTER — Encounter: Payer: Self-pay | Admitting: Internal Medicine

## 2023-08-22 NOTE — Assessment & Plan Note (Signed)
Lab Results  Component Value Date   VITAMINB12 277 01/28/2023   Los to start oral replacement - b12 1000 mcg qd

## 2023-08-22 NOTE — Assessment & Plan Note (Signed)
Lab Results  Component Value Date   HGBA1C 6.6 (H) 01/28/2023   New onset uncontrolled,, pt to start mounjaro 2.5 mg qwk

## 2023-08-22 NOTE — Assessment & Plan Note (Signed)
Mild to mod, for allegra 180 every day prn,  to f/u any worsening symptoms or concerns

## 2023-08-22 NOTE — Assessment & Plan Note (Signed)
Lab Results  Component Value Date   LDLCALC 39 01/28/2023   Stable, pt to continue current statin lipitor 80 qd

## 2023-08-22 NOTE — Assessment & Plan Note (Signed)
BP Readings from Last 3 Encounters:  08/19/23 128/84  07/27/23 118/78  06/01/23 128/76   Stable, pt to continue medical treatment hct 12.5 every day, losartan 100 every day, toprol xl 50 qd

## 2023-08-24 DIAGNOSIS — M47816 Spondylosis without myelopathy or radiculopathy, lumbar region: Secondary | ICD-10-CM | POA: Diagnosis not present

## 2023-08-24 DIAGNOSIS — Z79899 Other long term (current) drug therapy: Secondary | ICD-10-CM | POA: Diagnosis not present

## 2023-08-24 DIAGNOSIS — Z5181 Encounter for therapeutic drug level monitoring: Secondary | ICD-10-CM | POA: Diagnosis not present

## 2023-09-04 ENCOUNTER — Other Ambulatory Visit: Payer: Self-pay | Admitting: Internal Medicine

## 2023-09-19 ENCOUNTER — Encounter: Payer: Self-pay | Admitting: Internal Medicine

## 2023-09-20 MED ORDER — TIRZEPATIDE 5 MG/0.5ML ~~LOC~~ SOAJ: 5.0000 mg | SUBCUTANEOUS | 3 refills | Status: DC

## 2023-10-04 ENCOUNTER — Encounter: Payer: Self-pay | Admitting: Internal Medicine

## 2023-10-04 ENCOUNTER — Other Ambulatory Visit (HOSPITAL_COMMUNITY): Payer: Self-pay

## 2023-10-04 ENCOUNTER — Telehealth: Payer: Self-pay

## 2023-10-04 NOTE — Telephone Encounter (Signed)
Pharmacy Patient Advocate Encounter  Received notification from [facility] that Prior Authorization for Mounjaro 5mg /0.51ml has been APPROVED from 09/08/23 to 09/06/24. Spoke to pharmacy to process.Copay is $674 due to a deductible and they will also have to order the medication.    PA #/Case ID/Reference #: [id number]

## 2023-10-04 NOTE — Telephone Encounter (Signed)
Pharmacy Patient Advocate Encounter   Received notification from Pt Calls Messages that prior authorization for Mounjaro 5mg /0.16ml is required/requested.   Insurance verification completed.   The patient is insured through [employer] .   Per test claim: PA required; PA submitted to above mentioned insurance via CoverMyMeds Key/confirmation #/EOC [facility] Status is pending

## 2023-10-05 NOTE — Telephone Encounter (Signed)
Marland Kitchen

## 2023-10-25 ENCOUNTER — Encounter: Payer: Self-pay | Admitting: Internal Medicine

## 2023-11-11 ENCOUNTER — Other Ambulatory Visit: Payer: Self-pay

## 2023-11-11 ENCOUNTER — Telehealth: Payer: Self-pay | Admitting: Acute Care

## 2023-11-11 DIAGNOSIS — Z87891 Personal history of nicotine dependence: Secondary | ICD-10-CM

## 2023-11-11 DIAGNOSIS — Z122 Encounter for screening for malignant neoplasm of respiratory organs: Secondary | ICD-10-CM

## 2023-11-11 NOTE — Telephone Encounter (Signed)
 Lung Cancer Screening Narrative/Criteria Questionnaire (Cigarette Smokers Only- No Cigars/Pipes/vapes)   Molly Lewis   SDMV:11/30/23 at 10am / [facility]                                           1951/07/14              LDCT: 12/02/23 at 1140am / [facility]    73 y.o.   Phone: [phone]  Lung Screening Narrative (confirm age 60-77 yrs Medicare / 50-80 yrs Private pay insurance)   Insurance information:medicare / [employer]ia P[employer] supp   Referring Provider:Hannah   This screening involves an initial phone call with a team member from our program. It is called a shared decision making visit. The initial meeting is required by insurance and Medicare to make sure you understand the program. This appointment takes about 15-20 minutes to complete. The CT scan will completed at a separate date/time. This scan takes about 5-10 minutes to complete and you may eat and drink before and after the scan.  Criteria questions for Lung Cancer Screening:   Are you a current or former smoker? Former Age began smoking: 73 yo    If you are a former smoker, what year did you quit smoking? {2010 (within 15 yrs)   To calculate your smoking history, I need an accurate estimate of how many packs of cigarettes you smoked per day and for how many years. (Not just the number of PPD you are now smoking)   Years smoking 33 x Packs per day 1 to 1.5 = Pack years 41   (at least 20 pack yrs)   (Make sure they understand that we need to know how much they have smoked in the past, not just the number of PPD they are smoking now)  Do you have a personal history of cancer?  No    Do you have a family history of cancer? Yes  (cancer type and and relative) mother & mat gma / brain   Are you coughing up blood?  No  Have you had unexplained weight loss of 15 lbs or more in the last 6 months? No  It looks like you meet all criteria.     Additional information: N/A

## 2023-11-26 ENCOUNTER — Other Ambulatory Visit: Payer: Self-pay | Admitting: Internal Medicine

## 2023-11-30 ENCOUNTER — Ambulatory Visit (INDEPENDENT_AMBULATORY_CARE_PROVIDER_SITE_OTHER): Admitting: Acute Care

## 2023-11-30 DIAGNOSIS — Z87891 Personal history of nicotine dependence: Secondary | ICD-10-CM | POA: Diagnosis not present

## 2023-11-30 NOTE — Progress Notes (Signed)
 Provider Attestation I agree with the documentation of the Shared Decision Making visit,  smoking cessation counseling if appropriate, and verification or eligibility for lung cancer screening as documented by the RN Nurse Navigator.   Raejean Bullock, MSN, AGACNP-BC [facility] See Amion for personal pager PCCM on call pager [phone]      Virtual Visit via Video Note  I connected with Chalice Colt on 11/30/23 at 10:00 AM EDT by a video enabled telemedicine application and verified that I am speaking with the correct person using two identifiers.  Location: Patient: in home Provider: [address] [employer], [place], [address]     Shared Decision Making Visit Lung Cancer Screening Program [phone])   Eligibility: Age 73 y.o. Pack Years Smoking History Calculation 41 (# packs/per year x # years smoked) Recent History of coughing up blood  no Unexplained weight loss? no ( >Than 15 pounds within the last 6 months ) Prior History Lung / other cancer no (Diagnosis within the last 5 years already requiring surveillance chest CT Scans). Smoking Status Former Smoker Former Smokers: Years since quit: 14 years  Quit Date: 04-16-09  Visit Components: Discussion included one or more decision making aids. yes Discussion included risk/benefits of screening. yes Discussion included potential follow up diagnostic testing for abnormal scans. yes Discussion included meaning and risk of over diagnosis. yes Discussion included meaning and risk of False Positives. yes Discussion included meaning of total radiation exposure. yes  Counseling Included: Importance of adherence to annual lung cancer LDCT screening. yes Impact of comorbidities on ability to participate in the program. yes Ability and willingness to under diagnostic treatment. yes  Smoking Cessation Counseling: Current Smokers:  Discussed importance of smoking cessation. yes Information  about tobacco cessation classes and interventions provided to patient. yes Patient provided with "ticket" for LDCT Scan. yes Symptomatic Patient. no  Counseling NA Diagnosis Code: Tobacco Use Z72.0 Asymptomatic Patient yes  Counseling (Intermediate counseling: > three minutes counseling) [id number] Former Smokers:  Discussed the importance of maintaining cigarette abstinence. yes Diagnosis Code: Personal History of Nicotine Dependence. O13.086 Information about tobacco cessation classes and interventions provided to patient. Yes Patient provided with "ticket" for LDCT Scan. yes Written Order for Lung Cancer Screening with LDCT placed in Epic. Yes (CT Chest Lung Cancer Screening Low Dose W/O CM) [id number] Z12.2-Screening of respiratory organs Z87.891-Personal history of nicotine dependence   Valentin Gaskins, RN 11/30/23

## 2023-11-30 NOTE — Patient Instructions (Signed)

## 2023-12-02 ENCOUNTER — Ambulatory Visit
Admission: RE | Admit: 2023-12-02 | Discharge: 2023-12-02 | Disposition: A | Discharge status: Home / Self Care | Source: Ambulatory Visit | Attending: Internal Medicine | Admitting: Internal Medicine

## 2023-12-02 DIAGNOSIS — Z87891 Personal history of nicotine dependence: Secondary | ICD-10-CM

## 2023-12-02 DIAGNOSIS — Z122 Encounter for screening for malignant neoplasm of respiratory organs: Secondary | ICD-10-CM

## 2024-01-06 ENCOUNTER — Ambulatory Visit

## 2024-01-06 VITALS — Ht 65.0 in | Wt 182.0 lb

## 2024-01-06 DIAGNOSIS — Z122 Encounter for screening for malignant neoplasm of respiratory organs: Secondary | ICD-10-CM | POA: Diagnosis not present

## 2024-01-06 DIAGNOSIS — K635 Polyp of colon: Secondary | ICD-10-CM | POA: Diagnosis not present

## 2024-01-06 DIAGNOSIS — Z0000 Encounter for general adult medical examination without abnormal findings: Secondary | ICD-10-CM

## 2024-01-06 DIAGNOSIS — E119 Type 2 diabetes mellitus without complications: Secondary | ICD-10-CM

## 2024-01-06 DIAGNOSIS — Z87891 Personal history of nicotine dependence: Secondary | ICD-10-CM

## 2024-01-06 NOTE — Patient Instructions (Addendum)
 Ms. Molly Lewis , Thank you for taking time to come for your Medicare Wellness Visit. I appreciate your ongoing commitment to your health goals. Please review the following plan we discussed and let me know if I can assist you in the future.   Referrals/Orders/Follow-Ups/Clinician Recommendations: Aim for 30 minutes of exercise or brisk walking, 6-8 glasses of water, and 5 servings of fruits and vegetables each day. Ordered a Urine ACR and a Lung Cancer Screening test.  Referral to Dr Brice Campi for a repeat Colonoscopy.  This is a list of the screening recommended for you and due dates:  Health Maintenance  Topic Date Due   Eye exam for diabetics  08/07/2022   Screening for Lung Cancer  11/29/2022   Hemoglobin A1C  07/31/2023   Colon Cancer Screening  09/20/2023   Yearly kidney function blood test for diabetes  01/28/2024   Yearly kidney health urinalysis for diabetes  01/28/2024   Complete foot exam   01/28/2024   Flu Shot  04/07/2024   Mammogram  05/14/2024   Medicare Annual Wellness Visit  01/05/2025   DTaP/Tdap/Td vaccine (6 - Td or Tdap) 10/18/2029   Pneumonia Vaccine  Completed   DEXA scan (bone density measurement)  Completed   Hepatitis C Screening  Completed   Zoster (Shingles) Vaccine  Completed   HPV Vaccine  Aged Out   Meningitis B Vaccine  Aged Out   COVID-19 Vaccine  Discontinued    Advanced directives: (Provided) Advance directive discussed with you today. I have provided a copy for you to complete at home and have notarized. Once this is complete, please bring a copy in to our office so we can scan it into your chart.   Next Medicare Annual Wellness Visit scheduled for next year: Yes

## 2024-01-06 NOTE — Progress Notes (Signed)
 Subjective:   Molly Lewis is a 73 y.o. who presents for a Medicare Wellness preventive visit.  Visit Complete: Virtual I connected with  Chalice Colt on 01/06/24 by a audio enabled telemedicine application and verified that I am speaking with the correct person using two identifiers.  Patient Location: Home  Provider Location: Office/Clinic  I discussed the limitations of evaluation and management by telemedicine. The patient expressed understanding and agreed to proceed.  Vital Signs: Because this visit was a virtual/telehealth visit, some criteria may be missing or patient reported. Any vitals not documented were not able to be obtained and vitals that have been documented are patient reported.  VideoDeclined- This patient declined Librarian, academic. Therefore the visit was completed with audio only.  Persons Participating in Visit: Patient.  AWV Questionnaire: No: Patient Medicare AWV questionnaire was not completed prior to this visit.  Cardiac Risk Factors include: advanced age (>38men, >67 women);diabetes mellitus;dyslipidemia;hypertension;obesity (BMI >30kg/m2)     Objective:    Today's Vitals   01/06/24 0929  Weight: 182 lb (82.6 kg)  Height: 5\' 5"  (1.651 m)   Body mass index is 30.29 kg/m.     01/06/2024    9:26 AM 09/19/2020    7:11 AM 06/03/2019    4:39 PM 03/29/2019   10:04 AM 07/16/2018   11:40 AM 05/01/2016   12:14 PM  Advanced Directives  Does Patient Have a Medical Advance Directive? No No No No No No  Would patient like information on creating a medical advance directive? Yes (MAU/Ambulatory/Procedural Areas - Information given) Yes (MAU/Ambulatory/Procedural Areas - Information given)  No - Patient declined No - Patient declined No - patient declined information    Current Medications (verified) Outpatient Encounter Medications as of 01/06/2024  Medication Sig   ALPRAZolam  (XANAX ) 1 MG tablet TAKE 1 TABLET BY MOUTH TWICE  DAILY AS NEEDED   ASPIRIN  LOW DOSE 81 MG tablet TAKE 1 TABLET(81 MG) BY MOUTH DAILY. SWALLOW WHOLE   atorvastatin  (LIPITOR) 80 MG tablet Take 1 tablet (80 mg total) by mouth daily. Pt will need to see provider for future refills   Bacillus Coagulans-Inulin (PROBIOTIC-PREBIOTIC PO) Take 1 capsule by mouth daily.   Calcium -Magnesium-Vitamin D  (CALCIUM  1200+D3 PO) Take 1 tablet by mouth daily.   celecoxib  (CELEBREX ) 200 MG capsule TAKE 1 CAPSULE(200 MG) BY MOUTH TWICE DAILY AS NEEDED FOR MILD PAIN OR MODERATE PAIN   citalopram  (CELEXA ) 20 MG tablet Take 1 tablet (20 mg total) by mouth daily.   cyclobenzaprine  (FLEXERIL ) 5 MG tablet Take 1 tablet (5 mg total) by mouth 3 (three) times daily as needed for muscle spasms.   ezetimibe  (ZETIA ) 10 MG tablet TAKE 1 TABLET(10 MG) BY MOUTH DAILY   hydrochlorothiazide  (MICROZIDE ) 12.5 MG capsule Take 1 capsule (12.5 mg total) by mouth daily.   HYDROmorphone (DILAUDID) 2 MG tablet Take 2 mg by mouth 3 (three) times daily as needed.   losartan  (COZAAR ) 100 MG tablet TAKE 1 TABLET(100 MG) BY MOUTH DAILY   metoprolol  succinate (TOPROL -XL) 50 MG 24 hr tablet Take 1 tablet (50 mg total) by mouth daily. Take with or immediately following a meal.   metroNIDAZOLE  (METROGEL ) 0.75 % gel Apply 1 Application topically 2 (two) times daily.   Multiple Vitamin (MULTIVITAMIN WITH MINERALS) TABS tablet Take 1 tablet by mouth daily.   pantoprazole  (PROTONIX ) 40 MG tablet Take 1 tablet (40 mg total) by mouth daily.   tretinoin (RETIN-A) 0.025 % cream Apply topically every other day.  HYDROcodone-acetaminophen (NORCO/VICODIN) 5-325 MG tablet TAKE 1 TABLET BY MOUTH THREE TIMES DAILY FOR 5 DAYS AS NEEDED (Patient not taking: Reported on 01/06/2024)   tirzepatide (MOUNJARO) 5 MG/0.5ML Pen Inject 5 mg into the skin once a week. (Patient not taking: Reported on 01/06/2024)   No facility-administered encounter medications on file as of 01/06/2024.    Allergies (verified) Codeine,  Oxycodone, and Prevnar 20 [pneumococcal 20-val conj vacc]   History: Past Medical History:  Diagnosis Date   Allergic rhinitis, cause unspecified 07/18/2011   Allergy     ANXIETY 06/23/2007   BREAST BIOPSY, HX OF 06/23/2007   benign   Bursitis    Cataract    bilateral lens implants   Chronic back pain    Chronic constipation    "i have had it all my life and the last time i had a colonoscopy they tol me i have a distened colon because of it"    COLONIC POLYPS, HX OF 06/27/2008   COMMON MIGRAINE 06/23/2007   occasional   DEPRESSION 06/23/2007   Detached retina    GERD 06/23/2007   HYPERLIPIDEMIA 06/23/2007   Hypertension    Impaired glucose tolerance 02/16/2011   INSOMNIA-SLEEP DISORDER-UNSPEC 06/26/2009   Irritable bowel syndrome 06/23/2007   diet controlled   Microhematuria 07/31/2015   OSTEOARTHRITIS, LUMBAR SPINE 06/23/2007   OSTEOPENIA 07/11/2008   Pre-diabetes    Past Surgical History:  Procedure Laterality Date   APPENDECTOMY     BIOPSY  09/19/2020   Procedure: BIOPSY;  Surgeon: Normie Becton., MD;  Location[facility] ENDOSCOPY;  Service: Gastroenterology;;   CATARACT EXTRACTION     BIL   colonoscopy  02/2019   COLONOSCOPY  09/2013, 07/2008, 08/2003, 07/1999   multiple   COLONOSCOPY WITH PROPOFOL  N/A 03/29/2019   Procedure: COLONOSCOPY WITH PROPOFOL ;  Surgeon: Normie Becton., MD;  Location: [facility] ENDOSCOPY;  Service: Gastroenterology;  Laterality: N/A;   COLONOSCOPY WITH PROPOFOL  N/A 09/19/2020   Procedure: COLONOSCOPY WITH PROPOFOL ;  Surgeon: Brice Campi Albino Alu., MD;  Location: [facility] ENDOSCOPY;  Service: Gastroenterology;  Laterality: N/A;   ENDOSCOPIC MUCOSAL RESECTION N/A 03/29/2019   Procedure: ENDOSCOPIC MUCOSAL RESECTION;  Surgeon: Brice Campi Albino Alu., MD;  Location: [facility] ENDOSCOPY;  Service: Gastroenterology;  Laterality: N/A;   HEMOSTASIS CLIP PLACEMENT  03/29/2019   Procedure: HEMOSTASIS CLIP PLACEMENT;  Surgeon: Normie Becton., MD;   Location: [facility] ENDOSCOPY;  Service: Gastroenterology;;   POLYPECTOMY  09/19/2020   Procedure: POLYPECTOMY;  Surgeon: Normie Becton., MD;  Location: [facility] ENDOSCOPY;  Service: Gastroenterology;;   REPLACEMENT TOTAL KNEE Right 04/2020   RETINAL DETACHMENT SURGERY Right 01/2019   RETINAL TEAR REPAIR CRYOTHERAPY Right    SUBMUCOSAL LIFTING INJECTION  03/29/2019   Procedure: SUBMUCOSAL LIFTING INJECTION;  Surgeon: Normie Becton., MD;  Location: [facility] ENDOSCOPY;  Service: Gastroenterology;;   TOTAL ABDOMINAL HYSTERECTOMY     UPPER GASTROINTESTINAL ENDOSCOPY  10/2003   WISDOM TOOTH EXTRACTION     Family History  Problem Relation Age of Onset   Heart disease Father        MI in early 6's   Hypertension Father    Brain cancer Mother    Brain cancer Maternal Grandmother    Colon cancer Neg Hx    Esophageal cancer Neg Hx    Rectal cancer Neg Hx    Stomach cancer Neg Hx    Social History   Socioeconomic History   Marital status: Married    Spouse name: Not on file   Number of children: 2  Years of education: Not on file   Highest education level: Not on file  Occupational History   Not on file  Tobacco Use   Smoking status: Former    Current packs/day: 0.00    Average packs/day: 1 pack/day for 35.0 years (35.0 ttl pk-yrs)    Types: Cigarettes    Start date: 04/16/1974    Quit date: 04/16/2009    Years since quitting: 14.7    Passive exposure: Past   Smokeless tobacco: Never   Tobacco comments:    Quit smoking 04/15/09  Vaping Use   Vaping status: Never Used  Substance and Sexual Activity   Alcohol use: Not Currently   Drug use: No   Sexual activity: Yes    Birth control/protection: Surgical    Comment: Hysterectomy  Other Topics Concern   Not on file  Social History Narrative   Married   Social Drivers of Health   Financial Resource Strain: Low Risk  (01/06/2024)   Overall Financial Resource Strain (CARDIA)    Difficulty of Paying Living Expenses: Not hard at  all  Food Insecurity: No Food Insecurity (01/06/2024)   Hunger Vital Sign    Worried About Running Out of Food in the Last Year: Never true    Ran Out of Food in the Last Year: Never true  Transportation Needs: No Transportation Needs (01/06/2024)   PRAPARE - Administrator, Civil Service (Medical): No    Lack of Transportation (Non-Medical): No  Physical Activity: Sufficiently Active (01/06/2024)   Exercise Vital Sign    Days of Exercise per Week: 3 days    Minutes of Exercise per Session: 60 min  Stress: No Stress Concern Present (01/06/2024)   [employer] of Occupational Health - Occupational Stress Questionnaire    Feeling of Stress : Not at all  Social Connections: Moderately Isolated (01/06/2024)   Social Connection and Isolation Panel [NHANES]    Frequency of Communication with Friends and Family: More than three times a week    Frequency of Social Gatherings with Friends and Family: More than three times a week    Attends Religious Services: Never    Database administrator or Organizations: No    Attends Engineer, structural: Never    Marital Status: Married    Tobacco Counseling Counseling given: No Tobacco comments: Quit smoking 04/15/09    Clinical Intake:  Pre-visit preparation completed: Yes  Pain : No/denies pain     BMI - recorded: 30.29 Nutritional Status: BMI > 30  Obese Nutritional Risks: None Diabetes: Yes CBG done?: No Did pt. bring in CBG monitor from home?: No  Lab Results  Component Value Date   HGBA1C 6.6 (H) 01/28/2023   HGBA1C 6.1 10/23/2021   HGBA1C 5.8 10/15/2020     How often do you need to have someone help you when you read instructions, pamphlets, or other written materials from your doctor or pharmacy?: 1 - Never  Interpreter Needed?: No  Information entered by :: Kandy Orris, CMA   Activities of Daily Living     01/06/2024    9:32 AM  In your present state of health, do you have any difficulty performing  the following activities:  Hearing? 0  Vision? 0  Difficulty concentrating or making decisions? 0  Walking or climbing stairs? 0  Dressing or bathing? 0  Doing errands, shopping? 0  Preparing Food and eating ? N  Using the Toilet? N  In the past six months, have you  accidently leaked urine? N  Do you have problems with loss of bowel control? N  Managing your Medications? N  Managing your Finances? N  Housekeeping or managing your Housekeeping? N    Patient Care Team: Roslyn Coombe, MD as PCP - Unice Gant, Caretha Chapel, MD as PCP - Cardiology (Cardiology) Princella Brooklyn, OD (Optometry)  Indicate any recent Medical Services you may have received from other than [facility] providers in the past year (date may be approximate).     Assessment:   This is a routine wellness examination for Imane.  Hearing/Vision screen Hearing Screening - Comments:: Denies hearing difficulties   Vision Screening - Comments:: Wears rx glasses - Pt will schedule an appt w/[facility]   Goals Addressed               This Visit's Progress     Patient Stated (pt-stated)        Patient stated she wants to lose weight (about 20lbs).       Depression Screen     01/06/2024    9:38 AM 01/06/2024    9:35 AM 08/19/2023   10:29 AM 06/01/2023    9:55 AM 01/28/2023    9:00 AM 12/01/2022    4:16 PM 10/23/2021    9:50 AM  PHQ 2/9 Scores  PHQ - 2 Score 0 0 0 1 0 1 0  PHQ- 9 Score 1 0  7  4     Fall Risk     01/06/2024    9:33 AM 08/19/2023   10:29 AM 06/01/2023    9:55 AM 01/28/2023    9:00 AM 12/01/2022    4:16 PM  Fall Risk   Falls in the past year? 0 0 0 0 0  Number falls in past yr: 0 0 0 0 0  Injury with Fall? 0 0 0 0 0  Risk for fall due to : No Fall Risks No Fall Risks No Fall Risks No Fall Risks No Fall Risks  Follow up Falls prevention discussed;Falls evaluation completed Falls evaluation completed Falls evaluation completed Falls evaluation completed Falls evaluation completed     MEDICARE RISK AT HOME:  Medicare Risk at Home Any stairs in or around the home?: Yes (outside) If so, are there any without handrails?: No Home free of loose throw rugs in walkways, pet beds, electrical cords, etc?: Yes Adequate lighting in your home to reduce risk of falls?: Yes Life alert?: No Use of a cane, walker or w/c?: No Grab bars in the bathroom?: No Shower chair or bench in shower?: Yes Elevated toilet seat or a handicapped toilet?: Yes  TIMED UP AND GO:  Was the test performed?  No  Cognitive Function: 6CIT completed        01/06/2024    9:36 AM  6CIT Screen  What Year? 0 points  What month? 0 points  What time? 0 points  Count back from 20 0 points  Months in reverse 0 points  Repeat phrase 0 points  Total Score 0 points    Immunizations Immunization History  Administered Date(s) Administered   Fluad Trivalent(High Dose 65+) 06/01/2023   Influenza Split 06/13/2012   Influenza Whole 06/27/2008, 06/26/2009, 09/10/2010   Influenza,inj,Quad PF,6+ Mos 07/19/2013, 07/27/2014, 07/31/2015, 09/11/2016, 06/17/2017   Influenza,inj,quad, With Preservative 06/08/2019   Influenza-Unspecified 06/14/2018, 05/31/2021   Moderna Sars-Covid-2 Vaccination 10/09/2019, 10/30/2019, 11/24/2019, 12/22/2019, 07/20/2020, 05/31/2021   PNEUMOCOCCAL CONJUGATE-20 12/28/2022   Pneumococcal Conjugate-13 06/14/2018, 06/14/2018   Pneumococcal Polysaccharide-23 10/13/2017  Pneumococcal-Unspecified 06/08/2019   Td 06/26/2009   Tdap 09/07/2018, 09/08/2019, 10/19/2019   Tetanus 09/08/2019   Zoster Recombinant(Shingrix) 06/07/2022, 12/21/2022    Screening Tests Health Maintenance  Topic Date Due   OPHTHALMOLOGY EXAM  08/07/2022   Lung Cancer Screening  11/29/2022   HEMOGLOBIN A1C  07/31/2023   Colonoscopy  09/20/2023   Diabetic kidney evaluation - eGFR measurement  01/28/2024   Diabetic kidney evaluation - Urine ACR  01/28/2024   FOOT EXAM  01/28/2024   INFLUENZA VACCINE   04/07/2024   MAMMOGRAM  05/14/2024   Medicare Annual Wellness (AWV)  01/05/2025   DTaP/Tdap/Td (6 - Td or Tdap) 10/18/2029   Pneumonia Vaccine 40+ Years old  Completed   DEXA SCAN  Completed   Hepatitis C Screening  Completed   Zoster Vaccines- Shingrix  Completed   HPV VACCINES  Aged Out   Meningococcal B Vaccine  Aged Out   COVID-19 Vaccine  Discontinued    Health Maintenance  Health Maintenance Due  Topic Date Due   OPHTHALMOLOGY EXAM  08/07/2022   Lung Cancer Screening  11/29/2022   HEMOGLOBIN A1C  07/31/2023   Colonoscopy  09/20/2023   Diabetic kidney evaluation - eGFR measurement  01/28/2024   Diabetic kidney evaluation - Urine ACR  01/28/2024   Health Maintenance Items Addressed: Referral sent to GI for colonoscopy, Lung Cancer Screening ordered, UACR (Urine Albumin:Creatinine Ratio)  Additional Screening:  Vision Screening: Recommended annual ophthalmology exams for early detection of glaucoma and other disorders of the eye.  Dental Screening: Recommended annual dental exams for proper oral hygiene  Community Resource Referral / Chronic Care Management: CRR required this visit?  No   CCM required this visit?  No     Plan:     I have personally reviewed and noted the following in the patient's chart:   Medical and social history Use of alcohol, tobacco or illicit drugs  Current medications and supplements including opioid prescriptions. Patient is currently taking opioid prescriptions. Information provided to patient regarding non-opioid alternatives. Patient advised to discuss non-opioid treatment plan with their provider. Functional ability and status Nutritional status Physical activity Advanced directives List of other physicians Hospitalizations, surgeries, and ER visits in previous 12 months Vitals Screenings to include cognitive, depression, and falls Referrals and appointments  In addition, I have reviewed and discussed with patient certain  preventive protocols, quality metrics, and best practice recommendations. A written personalized care plan for preventive services as well as general preventive health recommendations were provided to patient.     Patria Bookbinder, CMA   01/06/2024   After Visit Summary: (Declined) Due to this being a telephonic visit, with patients personalized plan was offered to patient but patient Declined AVS at this time   Notes: Nothing significant to report at this time.

## 2024-01-13 ENCOUNTER — Encounter: Payer: Self-pay | Admitting: Gastroenterology

## 2024-02-01 ENCOUNTER — Other Ambulatory Visit: Payer: Self-pay | Admitting: Internal Medicine

## 2024-02-01 ENCOUNTER — Other Ambulatory Visit: Payer: Self-pay

## 2024-02-08 ENCOUNTER — Ambulatory Visit (INDEPENDENT_AMBULATORY_CARE_PROVIDER_SITE_OTHER): Payer: Medicare Other | Admitting: Internal Medicine

## 2024-02-08 ENCOUNTER — Encounter: Payer: Self-pay | Admitting: Internal Medicine

## 2024-02-08 VITALS — BP 122/78 | HR 70 | Temp 98.3°F | Ht 65.0 in | Wt 178.0 lb

## 2024-02-08 DIAGNOSIS — E1165 Type 2 diabetes mellitus with hyperglycemia: Secondary | ICD-10-CM

## 2024-02-08 DIAGNOSIS — K219 Gastro-esophageal reflux disease without esophagitis: Secondary | ICD-10-CM | POA: Diagnosis not present

## 2024-02-08 DIAGNOSIS — I10 Essential (primary) hypertension: Secondary | ICD-10-CM | POA: Diagnosis not present

## 2024-02-08 DIAGNOSIS — E7800 Pure hypercholesterolemia, unspecified: Secondary | ICD-10-CM

## 2024-02-08 DIAGNOSIS — E538 Deficiency of other specified B group vitamins: Secondary | ICD-10-CM

## 2024-02-08 DIAGNOSIS — E559 Vitamin D deficiency, unspecified: Secondary | ICD-10-CM

## 2024-02-08 LAB — BASIC METABOLIC PANEL WITH GFR
BUN: 25 mg/dL — ABNORMAL HIGH (ref 6–23)
CO2: 28 meq/L (ref 19–32)
Calcium: 9.7 mg/dL (ref 8.4–10.5)
Chloride: 101 meq/L (ref 96–112)
Creatinine, Ser: 1.18 mg/dL (ref 0.40–1.20)
GFR: 46.62 mL/min — ABNORMAL LOW (ref >60.00)
Glucose, Bld: 133 mg/dL — ABNORMAL HIGH (ref 70–99)
Potassium: 3.9 meq/L (ref 3.5–5.1)
Sodium: 137 meq/L (ref 135–145)

## 2024-02-08 LAB — CBC WITH DIFFERENTIAL/PLATELET
Basophils Absolute: 0 10*3/uL (ref 0.0–0.1)
Basophils Relative: 0.6 % (ref 0.0–3.0)
Eosinophils Absolute: 0.1 10*3/uL (ref 0.0–0.7)
Eosinophils Relative: 1.6 % (ref 0.0–5.0)
HCT: 39.9 % (ref 36.0–46.0)
Hemoglobin: 13.5 g/dL (ref 12.0–15.0)
Lymphocytes Relative: 38.5 % (ref 12.0–46.0)
Lymphs Abs: 2.3 10*3/uL (ref 0.7–4.0)
MCHC: 34 g/dL (ref 30.0–36.0)
MCV: 88.1 fl (ref 78.0–100.0)
Monocytes Absolute: 0.6 10*3/uL (ref 0.1–1.0)
Monocytes Relative: 9.2 % (ref 3.0–12.0)
Neutro Abs: 3 10*3/uL (ref 1.4–7.7)
Neutrophils Relative %: 50.1 % (ref 43.0–77.0)
Platelets: 192 10*3/uL (ref 150.0–400.0)
RBC: 4.53 Mil/uL (ref 3.87–5.11)
RDW: 13.3 % (ref 11.5–15.5)
WBC: 6 10*3/uL (ref 4.0–10.5)

## 2024-02-08 LAB — LIPID PANEL
Cholesterol: 137 mg/dL (ref 0–200)
HDL: 37.6 mg/dL — ABNORMAL LOW (ref >39.00)
LDL Cholesterol: 40 mg/dL (ref 0–99)
NonHDL: 99.2
Total CHOL/HDL Ratio: 4
Triglycerides: 297 mg/dL — ABNORMAL HIGH (ref 0.0–149.0)
VLDL: 59.4 mg/dL — ABNORMAL HIGH (ref 0.0–40.0)

## 2024-02-08 LAB — MICROALBUMIN / CREATININE URINE RATIO
Creatinine,U: 58.9 mg/dL
Microalb Creat Ratio: UNDETERMINED mg/g (ref 0.0–30.0)
Microalb, Ur: <0.7 mg/dL

## 2024-02-08 LAB — URINALYSIS, ROUTINE W REFLEX MICROSCOPIC
Bilirubin Urine: NEGATIVE
Ketones, ur: NEGATIVE
Leukocytes,Ua: NEGATIVE
Nitrite: NEGATIVE
Specific Gravity, Urine: 1.015 (ref 1.000–1.030)
Total Protein, Urine: NEGATIVE
Urine Glucose: NEGATIVE
Urobilinogen, UA: 0.2 (ref 0.0–1.0)
pH: 6 (ref 5.0–8.0)

## 2024-02-08 LAB — VITAMIN D 25 HYDROXY (VIT D DEFICIENCY, FRACTURES): VITD: 41.57 ng/mL (ref 30.00–100.00)

## 2024-02-08 LAB — HEMOGLOBIN A1C: Hgb A1c MFr Bld: 7.2 % — ABNORMAL HIGH (ref 4.6–6.5)

## 2024-02-08 LAB — HEPATIC FUNCTION PANEL
ALT: 33 U/L (ref 0–35)
AST: 27 U/L (ref 0–37)
Albumin: 4.5 g/dL (ref 3.5–5.2)
Alkaline Phosphatase: 76 U/L (ref 39–117)
Bilirubin, Direct: 0.1 mg/dL (ref 0.0–0.3)
Total Bilirubin: 0.6 mg/dL (ref 0.2–1.2)
Total Protein: 7.5 g/dL (ref 6.0–8.3)

## 2024-02-08 LAB — TSH: TSH: 1.16 u[IU]/mL (ref 0.35–5.50)

## 2024-02-08 LAB — VITAMIN B12: Vitamin B-12: 345 pg/mL (ref 211–911)

## 2024-02-08 MED ORDER — FAMOTIDINE 20 MG PO TABS: ORAL_TABLET | ORAL | 3 refills | Status: AC

## 2024-02-08 NOTE — Assessment & Plan Note (Signed)
 Lab Results  Component Value Date   HGBA1C 6.6 (H) 01/28/2023   Stable, pt to continue current medical treatment - diet,wt control, f/u lab today

## 2024-02-08 NOTE — Assessment & Plan Note (Signed)
Lab Results  Component Value Date   VITAMINB12 277 01/28/2023   Low, to start oral replacement - b12 1000 mcg qd

## 2024-02-08 NOTE — Patient Instructions (Signed)
 Please take all new medication as prescribed - the bedtime pepcid  20 mg   Please continue all other medications as before, and refills have been done if requested.  Please have the pharmacy call with any other refills you may need.  Please continue your efforts at being more active, low cholesterol diet, and weight control.  You are otherwise up to date with prevention measures today.  Please keep your appointments with your specialists as you may have planned  Please go to the LAB at the blood drawing area for the tests to be done  You will be contacted by phone if any changes need to be made immediately.  Otherwise, you will receive a letter about your results with an explanation, but please check with MyChart first.  Please make an Appointment to return in 6 months, or sooner if needed

## 2024-02-08 NOTE — Assessment & Plan Note (Signed)
 Uncontrolled mild to mod, cont protonix  40 every day, add pepcid  20 mg qhs

## 2024-02-08 NOTE — Progress Notes (Signed)
 Patient ID: Molly Lewis, female   DOB: 04-Jan-1951, 73 y.o.   MRN: [id number]        Chief Complaint: follow up HTN, HLD, dm, low b12       HPI:  Molly Lewis is a 73 y.o. female here overall doing ok, hard to lose wt but did lose 4 lbs with better diet; Pt denies chest pain, increased sob or doe, wheezing, orthopnea, PND, increased LE swelling, palpitations, dizziness or syncope.   Pt denies polydipsia, polyuria, or new focal neuro s/s.    Pt denies fever, wt loss, night sweats, loss of appetite, or other constitutional symptoms  Scheduled for colonoscopy July 2025  Was not able to start the mounjaro due to cost.  Does have worsening reflux recently, particularly at night.  Wt Readings from Last 3 Encounters:  02/08/24 178 lb (80.7 kg)  01/06/24 182 lb (82.6 kg)  08/19/23 182 lb (82.6 kg)   BP Readings from Last 3 Encounters:  02/08/24 122/78  08/19/23 128/84  07/27/23 118/78         Past Medical History:  Diagnosis Date   Allergic rhinitis, cause unspecified 07/18/2011   Allergy     ANXIETY 06/23/2007   BREAST BIOPSY, HX OF 06/23/2007   benign   Bursitis    Cataract    bilateral lens implants   Chronic back pain    Chronic constipation    "i have had it all my life and the last time i had a colonoscopy they tol me i have a distened colon because of it"    COLONIC POLYPS, HX OF 06/27/2008   COMMON MIGRAINE 06/23/2007   occasional   DEPRESSION 06/23/2007   Detached retina    GERD 06/23/2007   HYPERLIPIDEMIA 06/23/2007   Hypertension    Impaired glucose tolerance 02/16/2011   INSOMNIA-SLEEP DISORDER-UNSPEC 06/26/2009   Irritable bowel syndrome 06/23/2007   diet controlled   Microhematuria 07/31/2015   OSTEOARTHRITIS, LUMBAR SPINE 06/23/2007   OSTEOPENIA 07/11/2008   Pre-diabetes    Past Surgical History:  Procedure Laterality Date   APPENDECTOMY     BIOPSY  09/19/2020   Procedure: BIOPSY;  Surgeon: Normie Becton., MD;  Location: [facility];  Service:  Gastroenterology;;   CATARACT EXTRACTION     BIL   colonoscopy  02/2019   COLONOSCOPY  09/2013, 07/2008, 08/2003, 07/1999   multiple   COLONOSCOPY WITH PROPOFOL  N/A 03/29/2019   Procedure: COLONOSCOPY WITH PROPOFOL ;  Surgeon: Normie Becton., MD;  Location: [facility];  Service: Gastroenterology;  Laterality: N/A;   COLONOSCOPY WITH PROPOFOL  N/A 09/19/2020   Procedure: COLONOSCOPY WITH PROPOFOL ;  Surgeon: Mansouraty, Albino Alu., MD;  Location: [facility] ENDOSCOPY;  Service: Gastroenterology;  Laterality: N/A;   ENDOSCOPIC MUCOSAL RESECTION N/A 03/29/2019   Procedure: ENDOSCOPIC MUCOSAL RESECTION;  Surgeon: Brice Campi Albino Alu., MD;  Location: [facility]COPY;  Service: Gastroenterology;  Laterality: N/A;   HEMOSTASIS CLIP PLACEMENT  03/29/2019   Procedure: HEMOSTASIS CLIP PLACEMENT;  Surgeon: Normie Becton., MD;  Location: [facility];  Service: Gastroenterology;;   POLYPECTOMY  09/19/2020   Procedure: POLYPECTOMY;  Surgeon: Normie Becton., MD;  Location: [facility] ENDOSCOPY;  Service: Gastroenterology;;   REPLACEMENT TOTAL KNEE Right 04/2020   RETINAL DETACHMENT SURGERY Right 01/2019   RETINAL TEAR REPAIR CRYOTHERAPY Right    SUBMUCOSAL LIFTING INJECTION  03/29/2019   Procedure: SUBMUCOSAL LIFTING INJECTION;  Surgeon: Normie Becton., MD;  Location: [facility];  Service: Gastroenterology;;   TOTAL ABDOMINAL HYSTERECTOMY     UPPER  GASTROINTESTINAL ENDOSCOPY  10/2003   WISDOM TOOTH EXTRACTION      reports that she quit smoking about 14 years ago. Her smoking use included cigarettes. She started smoking about 49 years ago. She has a 35 pack-year smoking history. She has been exposed to tobacco smoke. She has never used smokeless tobacco. She reports that she does not currently use alcohol. She reports that she does not use drugs. family history includes Brain cancer in her maternal grandmother and mother; Heart disease in her father; Hypertension in her father. Allergies   Allergen Reactions   Codeine Nausea And Vomiting    Violent vomiting   Oxycodone    Prevnar 20 [Pneumococcal 20-Val Conj Vacc] Other (See Comments)    Rash to torso   Current Outpatient Medications on File Prior to Visit  Medication Sig Dispense Refill   ALPRAZolam  (XANAX ) 1 MG tablet TAKE 1 TABLET BY MOUTH TWICE DAILY AS NEEDED 60 tablet 2   ASPIRIN  LOW DOSE 81 MG tablet TAKE 1 TABLET(81 MG) BY MOUTH DAILY. SWALLOW WHOLE 90 tablet 3   atorvastatin  (LIPITOR) 80 MG tablet TAKE 1 TABLET(80 MG) BY MOUTH DAILY 90 tablet 3   Bacillus Coagulans-Inulin (PROBIOTIC-PREBIOTIC PO) Take 1 capsule by mouth daily.     Calcium -Magnesium-Vitamin D  (CALCIUM  1200+D3 PO) Take 1 tablet by mouth daily.     celecoxib  (CELEBREX ) 200 MG capsule TAKE 1 CAPSULE(200 MG) BY MOUTH TWICE DAILY AS NEEDED FOR MILD PAIN OR MODERATE PAIN 180 capsule 3   citalopram  (CELEXA ) 20 MG tablet Take 1 tablet (20 mg total) by mouth daily. 90 tablet 3   cyclobenzaprine  (FLEXERIL ) 5 MG tablet Take 1 tablet (5 mg total) by mouth 3 (three) times daily as needed for muscle spasms. 60 tablet 1   ezetimibe  (ZETIA ) 10 MG tablet TAKE 1 TABLET(10 MG) BY MOUTH DAILY 90 tablet 3   hydrochlorothiazide  (MICROZIDE ) 12.5 MG capsule Take 1 capsule (12.5 mg total) by mouth daily. 90 capsule 3   HYDROcodone-acetaminophen (NORCO/VICODIN) 5-325 MG tablet      HYDROmorphone (DILAUDID) 2 MG tablet Take 2 mg by mouth 3 (three) times daily as needed.     losartan  (COZAAR ) 100 MG tablet TAKE 1 TABLET(100 MG) BY MOUTH DAILY 90 tablet 3   metoprolol  succinate (TOPROL -XL) 50 MG 24 hr tablet Take 1 tablet (50 mg total) by mouth daily. Take with or immediately following a meal. 90 tablet 3   metroNIDAZOLE  (METROGEL ) 0.75 % gel Apply 1 Application topically 2 (two) times daily. 45 g 2   Multiple Vitamin (MULTIVITAMIN WITH MINERALS) TABS tablet Take 1 tablet by mouth daily.     pantoprazole  (PROTONIX ) 40 MG tablet TAKE 1 TABLET(40 MG) BY MOUTH DAILY 90 tablet 3    tirzepatide (MOUNJARO) 5 MG/0.5ML Pen Inject 5 mg into the skin once a week. 6 mL 3   tretinoin (RETIN-A) 0.025 % cream Apply topically every other day.     No current facility-administered medications on file prior to visit.        ROS:  All others reviewed and negative.  Objective        PE:  BP 122/78 (BP Location: Left Arm, Patient Position: Sitting, Cuff Size: Normal)   Pulse 70   Temp 98.3 F (36.8 C) (Oral)   Ht 5\' 5"  (1.651 m)   Wt 178 lb (80.7 kg)   LMP  (LMP Unknown)   SpO2 98%   BMI 29.62 kg/m  Constitutional: Pt appears in NAD               HENT: Head: NCAT.                Right Ear: External ear normal.                 Left Ear: External ear normal.                Eyes: . Pupils are equal, round, and reactive to light. Conjunctivae and EOM are normal               Nose: without d/c or deformity               Neck: Neck supple. Gross normal ROM               Cardiovascular: Normal rate and regular rhythm.                 Pulmonary/Chest: Effort normal and breath sounds without rales or wheezing.                Abd:  Soft, NT, ND, + BS, no organomegaly               Neurological: Pt is alert. At baseline orientation, motor grossly intact               Skin: Skin is warm. No rashes, no other new lesions, LE edema - none               Psychiatric: Pt behavior is normal without agitation   Micro: none  Cardiac tracings I have personally interpreted today:  none  Pertinent Radiological findings (summarize): none   Lab Results  Component Value Date   WBC 4.4 01/28/2023   HGB 13.5 01/28/2023   HCT 39.8 01/28/2023   PLT 163.0 01/28/2023   GLUCOSE 109 (H) 01/28/2023   CHOL 116 01/28/2023   TRIG 185.0 (H) 01/28/2023   HDL 40.00 01/28/2023   LDLDIRECT 90.0 10/15/2020   LDLCALC 39 01/28/2023   ALT 35 01/28/2023   AST 28 01/28/2023   NA 139 01/28/2023   K 3.6 01/28/2023   CL 99 01/28/2023   CREATININE 0.91 01/28/2023   BUN 17 01/28/2023   CO2  32 01/28/2023   TSH 0.89 01/28/2023   HGBA1C 6.6 (H) 01/28/2023   MICROALBUR 0.9 01/28/2023   Assessment/Plan:  Molly Lewis is a 73 y.o. White or Caucasian [1] female with  has a past medical history of Allergic rhinitis, cause unspecified (07/18/2011), Allergy , ANXIETY (06/23/2007), BREAST BIOPSY, HX OF (06/23/2007), Bursitis, Cataract, Chronic back pain, Chronic constipation, COLONIC POLYPS, HX OF (06/27/2008), COMMON MIGRAINE (06/23/2007), DEPRESSION (06/23/2007), Detached retina, GERD (06/23/2007), HYPERLIPIDEMIA (06/23/2007), Hypertension, Impaired glucose tolerance (02/16/2011), INSOMNIA-SLEEP DISORDER-UNSPEC (06/26/2009), Irritable bowel syndrome (06/23/2007), Microhematuria (07/31/2015), OSTEOARTHRITIS, LUMBAR SPINE (06/23/2007), OSTEOPENIA (07/11/2008), and Pre-diabetes.  Hyperlipidemia Lab Results  Component Value Date   LDLCALC 39 01/28/2023   Stable, pt to continue current statin lipitor 80 mg qd   GERD Uncontrolled mild to mod, cont protonix  40 every day, add pepcid  20 mg qhs  Diabetes (HCC) Lab Results  Component Value Date   HGBA1C 6.6 (H) 01/28/2023   Stable, pt to continue current medical treatment - diet,wt control, f/u lab today   Hypertension BP Readings from Last 3 Encounters:  02/08/24 122/78  08/19/23 128/84  07/27/23 118/78   Stable, pt to continue medical treatment hct 12.5 every day, losartan  100 every day,  toprol  xl 50 qd   B12 deficiency Lab Results  Component Value Date   VITAMINB12 277 01/28/2023   Low, to start oral replacement - b12 1000 mcg qd  Followup: Return in about 6 months (around 08/09/2024).  Rosalia Colonel, MD 02/08/2024 10:42 AM [facility]

## 2024-02-08 NOTE — Assessment & Plan Note (Signed)
 Lab Results  Component Value Date   LDLCALC 39 01/28/2023   Stable, pt to continue current statin lipitor 80 mg qd

## 2024-02-08 NOTE — Assessment & Plan Note (Signed)
 BP Readings from Last 3 Encounters:  02/08/24 122/78  08/19/23 128/84  07/27/23 118/78   Stable, pt to continue medical treatment hct 12.5 every day, losartan  100 every day, toprol  xl 50 qd

## 2024-02-09 ENCOUNTER — Ambulatory Visit: Payer: Self-pay | Admitting: Internal Medicine

## 2024-03-07 ENCOUNTER — Ambulatory Visit (AMBULATORY_SURGERY_CENTER): Admitting: *Deleted

## 2024-03-07 VITALS — Ht 65.0 in | Wt 175.0 lb

## 2024-03-07 DIAGNOSIS — Z860100 Personal history of colon polyps, unspecified: Secondary | ICD-10-CM

## 2024-03-07 MED ORDER — NA SULFATE-K SULFATE-MG SULF 17.5-3.13-1.6 GM/177ML PO SOLN: 1.0000 | Freq: Once | ORAL | 0 refills | Status: AC

## 2024-03-07 NOTE — Progress Notes (Signed)
 Pt's name and DOB verified at the beginning of the pre-visit wit 2 identifiers   Pt denies any difficulty with ambulating,sitting, laying down or rolling side to side  Pt has no issues moving head neck or swallowing  No egg or soy allergy  known to patient   No issues known to pt with past sedation with any surgeries or procedures  No FH of Malignant Hyperthermia  Pt is not on home 02   Pt is not on blood thinners   Pt denies issues with constipation   Pt has frequent issues with constipation RN instructed pt to use Miralax per bottles instructions a week before prep days. Pt states they will  Pt is not on dialysis  Pt denise any abnormal heart rhythms   Pt denies any upcoming cardiac testing  Patient's chart reviewed by Norleen Schillings CNRA prior to pre-visit and patient appropriate for the [facility].  Pre-visit completed and red dot placed by patient's name on their procedure day (on provider's schedule).    Visit by phone  Pt states weight is 175 lb  Instructions reviewed. Pt given  both [facility] main # and MD on call # prior to instructions.  Pt states understanding of instructions. Instructed pt to review instructions again prior to procedure and call main # given if has questions.. Pt states they will.   Instructed pt on where to find instructions on My Chart.

## 2024-03-21 ENCOUNTER — Encounter: Payer: Self-pay | Admitting: Gastroenterology

## 2024-03-21 ENCOUNTER — Ambulatory Visit: Admitting: Gastroenterology

## 2024-03-21 VITALS — BP 91/49 | HR 68 | Temp 97.3°F | Resp 19 | Ht 65.0 in | Wt 175.0 lb

## 2024-03-21 DIAGNOSIS — K5730 Diverticulosis of large intestine without perforation or abscess without bleeding: Secondary | ICD-10-CM

## 2024-03-21 DIAGNOSIS — K644 Residual hemorrhoidal skin tags: Secondary | ICD-10-CM

## 2024-03-21 DIAGNOSIS — K635 Polyp of colon: Secondary | ICD-10-CM

## 2024-03-21 DIAGNOSIS — D123 Benign neoplasm of transverse colon: Secondary | ICD-10-CM | POA: Diagnosis not present

## 2024-03-21 DIAGNOSIS — Z860101 Personal history of adenomatous and serrated colon polyps: Secondary | ICD-10-CM

## 2024-03-21 DIAGNOSIS — D125 Benign neoplasm of sigmoid colon: Secondary | ICD-10-CM

## 2024-03-21 DIAGNOSIS — Z1211 Encounter for screening for malignant neoplasm of colon: Secondary | ICD-10-CM | POA: Diagnosis not present

## 2024-03-21 DIAGNOSIS — K648 Other hemorrhoids: Secondary | ICD-10-CM

## 2024-03-21 DIAGNOSIS — Z860100 Personal history of colon polyps, unspecified: Secondary | ICD-10-CM

## 2024-03-21 MED ORDER — SODIUM CHLORIDE 0.9 % IV SOLN: 500.0000 mL | INTRAVENOUS | Status: DC

## 2024-03-21 NOTE — Op Note (Addendum)
 [facility] Patient Name: Molly Lewis Procedure Date: 03/21/2024 9:02 AM MRN: [id number] Endoscopist: Gustav ALONSO Mcgee , MD, [id number] Age: 73 Referring MD:  Date of Birth: 13-Jul-1951 Gender: Female Account #: [id number] Procedure:                Colonoscopy Indications:              High risk colon cancer surveillance: Personal                            history of colonic polyps, Surveillance: Piecemeal                            removal of large sessile adenoma last colonoscopy                            2022, High risk colon cancer surveillance: Personal                            history of adenoma (10 mm or greater in size), High                            risk colon cancer surveillance: Personal history of                            multiple (3 or more) adenomas, High risk colon                            cancer surveillance: Personal history of sessile                            serrated colon polyp (10 mm or greater in size) Medicines:                Monitored Anesthesia Care Procedure:                Pre-Anesthesia Assessment:                           - Prior to the procedure, a History and Physical                            was performed, and patient medications and                            allergies were reviewed. The patient's tolerance of                            previous anesthesia was also reviewed. The risks                            and benefits of the procedure and the sedation                            options and risks were discussed with the patient.  All questions were answered, and informed consent                            was obtained. Prior Anticoagulants: The patient has                            taken no anticoagulant or antiplatelet agents. ASA                            Grade Assessment: II - A patient with mild systemic                            disease. After reviewing the risks and benefits,                             the patient was deemed in satisfactory condition to                            undergo the procedure.                           After obtaining informed consent, the colonoscope                            was passed under direct vision. Throughout the                            procedure, the patient's blood pressure, pulse, and                            oxygen saturations were monitored continuously. The                            Olympus Scope SN: [id number] was introduced through                            the anus and advanced to the the cecum, identified                            by appendiceal orifice and ileocecal valve. The                            colonoscopy was performed without difficulty. The                            patient tolerated the procedure well. The quality                            of the bowel preparation was adequate. The                            ileocecal valve, appendiceal orifice, and rectum  were photographed. Scope In: 9:13:35 AM Scope Out: 9:41:26 AM Scope Withdrawal Time: 0 hours 16 minutes 29 seconds  Total Procedure Duration: 0 hours 27 minutes 51 seconds  Findings:                 The perianal and digital rectal examinations were                            normal.                           A 7 mm polyp was found in the transverse colon. The                            polyp was sessile. The polyp was removed with a                            cold snare. Resection and retrieval were complete.                           Two sessile polyps were found in the sigmoid colon.                            The polyps were 3 to 5 mm in size. These polyps                            were removed with a cold snare. Resection and                            retrieval were complete.                           Multiple small-mouthed diverticula were found in                            the sigmoid colon and descending colon.  There was                            narrowing of the colon in association with the                            diverticular opening. There was evidence of                            diverticular spasm.                           Non-bleeding external and internal hemorrhoids were                            found during retroflexion. The hemorrhoids were                            medium-sized. Complications:            No immediate complications.  Estimated Blood Loss:     Estimated blood loss was minimal. Impression:               - One 7 mm polyp in the transverse colon, removed                            with a cold snare. Resected and retrieved.                           - Two 3 to 5 mm polyps in the sigmoid colon,                            removed with a cold snare. Resected and retrieved.                           - Severe diverticulosis in the sigmoid colon and in                            the descending colon. There was narrowing of the                            colon in association with the diverticular opening.                            There was evidence of diverticular spasm.                           - Non-bleeding external and internal hemorrhoids. Recommendation:           - Patient has a contact number available for                            emergencies. The signs and symptoms of potential                            delayed complications were discussed with the                            patient. Return to normal activities tomorrow.                            Written discharge instructions were provided to the                            patient.                           - Resume previous diet.                           - Continue present medications.                           - Await pathology results.                           -  Repeat colonoscopy in 3-5 years for surveillance                            based on pathology results. Molly Lewis V. Molly Bencosme, MD 03/21/2024  9:50:12 AM This report has been signed electronically.

## 2024-03-21 NOTE — Progress Notes (Unsigned)
 Patient states there have been no changes to medical or surgical history since time of pre-visit.

## 2024-03-21 NOTE — Progress Notes (Unsigned)
 [facility] History and Physical   Primary Care Physician:  Norleen Lynwood ORN, MD   Reason for Procedure:  History of adenomatous colon polyps  Plan:    Surveillance colonoscopy with possible interventions as needed     HPI: Molly Lewis is a very pleasant 73 y.o. female here for surveillance colonoscopy. Denies any nausea, vomiting, abdominal pain, melena or bright red blood per rectum  The risks and benefits as well as alternatives of endoscopic procedure(s) have been discussed and reviewed. All questions answered. The patient agrees to proceed.    Past Medical History:  Diagnosis Date   Allergic rhinitis, cause unspecified 07/18/2011   Allergy     ANXIETY 06/23/2007   Arthritis    BREAST BIOPSY, HX OF 06/23/2007   benign   Bursitis    Cataract    bilateral lens implants   Chronic back pain    Chronic constipation    i have had it all my life and the last time i had a colonoscopy they tol me i have a distened colon because of it    COLONIC POLYPS, HX OF 06/27/2008   COMMON MIGRAINE 06/23/2007   occasional   DEPRESSION 06/23/2007   Detached retina    GERD 06/23/2007   HYPERLIPIDEMIA 06/23/2007   Hypertension    Impaired glucose tolerance 02/16/2011   INSOMNIA-SLEEP DISORDER-UNSPEC 06/26/2009   Irritable bowel syndrome 06/23/2007   diet controlled   Microhematuria 07/31/2015   OSTEOARTHRITIS, LUMBAR SPINE 06/23/2007   OSTEOPENIA 07/11/2008   Pre-diabetes     Past Surgical History:  Procedure Laterality Date   APPENDECTOMY     BIOPSY  09/19/2020   Procedure: BIOPSY;  Surgeon: Wilhelmenia Aloha Raddle., MD;  Location: [facility] ENDOSCOPY;  Service: Gastroenterology;;   CATARACT EXTRACTION     BIL   colonoscopy  02/2019   COLONOSCOPY  09/2013, 07/2008, 08/2003, 07/1999   multiple   COLONOSCOPY WITH PROPOFOL  N/A 03/29/2019   Procedure: COLONOSCOPY WITH PROPOFOL ;  Surgeon: Wilhelmenia Aloha Raddle., MD;  Location: [facility];  Service: Gastroenterology;   Laterality: N/A;   COLONOSCOPY WITH PROPOFOL  N/A 09/19/2020   Procedure: COLONOSCOPY WITH PROPOFOL ;  Surgeon: Mansouraty, Aloha Raddle., MD;  Location: [facility]COPY;  Service: Gastroenterology;  Laterality: N/A;   ENDOSCOPIC MUCOSAL RESECTION N/A 03/29/2019   Procedure: ENDOSCOPIC MUCOSAL RESECTION;  Surgeon: Wilhelmenia Aloha Raddle., MD;  Location: [facility] ENDOSCOPY;  Service: Gastroenterology;  Laterality: N/A;   HEMOSTASIS CLIP PLACEMENT  03/29/2019   Procedure: HEMOSTASIS CLIP PLACEMENT;  Surgeon: Wilhelmenia Aloha Raddle., MD;  Location: [facility];  Service: Gastroenterology;;   POLYPECTOMY  09/19/2020   Procedure: POLYPECTOMY;  Surgeon: Wilhelmenia Aloha Raddle., MD;  Location: [facility] ENDOSCOPY;  Service: Gastroenterology;;   REPLACEMENT TOTAL KNEE Right 04/2020   RETINAL DETACHMENT SURGERY Right 01/2019   RETINAL TEAR REPAIR CRYOTHERAPY Right    SUBMUCOSAL LIFTING INJECTION  03/29/2019   Procedure: SUBMUCOSAL LIFTING INJECTION;  Surgeon: Wilhelmenia Aloha Raddle., MD;  Location: [facility];  Service: Gastroenterology;;   TOTAL ABDOMINAL HYSTERECTOMY     UPPER GASTROINTESTINAL ENDOSCOPY  10/2003   WISDOM TOOTH EXTRACTION      Prior to Admission medications   Medication Sig Start Date End Date Taking? Authorizing Provider  ASPIRIN  LOW DOSE 81 MG tablet TAKE 1 TABLET(81 MG) BY MOUTH DAILY. SWALLOW WHOLE 09/06/23  Yes Chandrasekhar, Mahesh A, MD  atorvastatin  (LIPITOR) 80 MG tablet TAKE 1 TABLET(80 MG) BY MOUTH DAILY 02/01/24  Yes Norleen Lynwood ORN, MD  Calcium -Magnesium-Vitamin D  (CALCIUM  1200+D3 PO) Take 1 tablet by mouth  daily.   Yes [provider]  citalopram  (CELEXA ) 20 MG tablet Take 1 tablet (20 mg total) by mouth daily. 01/28/23  Yes Norleen Lynwood ORN, MD  ezetimibe  (ZETIA ) 10 MG tablet TAKE 1 TABLET(10 MG) BY MOUTH DAILY 01/28/23  Yes Norleen Lynwood ORN, MD  hydrochlorothiazide  (MICROZIDE ) 12.5 MG capsule Take 1 capsule (12.5 mg total) by mouth daily. 01/28/23  Yes Norleen Lynwood ORN, MD  losartan  (COZAAR )  100 MG tablet TAKE 1 TABLET(100 MG) BY MOUTH DAILY 02/01/24  Yes Norleen Lynwood ORN, MD  metoprolol  succinate (TOPROL -XL) 50 MG 24 hr tablet Take 1 tablet (50 mg total) by mouth daily. Take with or immediately following a meal. 01/28/23  Yes Norleen Lynwood ORN, MD  metroNIDAZOLE  (METROGEL ) 0.75 % gel Apply 1 Application topically 2 (two) times daily. 01/28/23  Yes Norleen Lynwood ORN, MD  Multiple Vitamin (MULTIVITAMIN WITH MINERALS) TABS tablet Take 1 tablet by mouth daily.   Yes [provider]  pantoprazole  (PROTONIX ) 40 MG tablet TAKE 1 TABLET(40 MG) BY MOUTH DAILY 02/01/24  Yes Norleen Lynwood ORN, MD  tretinoin (RETIN-A) 0.025 % cream Apply topically every other day. 07/13/23  Yes [provider]  ALPRAZolam  (XANAX ) 1 MG tablet TAKE 1 TABLET BY MOUTH TWICE DAILY AS NEEDED 11/29/23   Norleen Lynwood ORN, MD  Bacillus Coagulans-Inulin (PROBIOTIC-PREBIOTIC PO) Take 1 capsule by mouth daily. Patient not taking: Reported on 03/21/2024    [provider]  celecoxib  (CELEBREX ) 200 MG capsule TAKE 1 CAPSULE(200 MG) BY MOUTH TWICE DAILY AS NEEDED FOR MILD PAIN OR MODERATE PAIN Patient taking differently: as needed. TAKE 1 CAPSULE(200 MG) BY MOUTH TWICE DAILY AS NEEDED FOR MILD PAIN OR MODERATE PAIN 01/28/23   Norleen Lynwood ORN, MD  cyclobenzaprine  (FLEXERIL ) 5 MG tablet Take 1 tablet (5 mg total) by mouth 3 (three) times daily as needed for muscle spasms. 01/28/23   Norleen Lynwood ORN, MD  famotidine  (PEPCID ) 20 MG tablet 1 tab by mouth at bedtime once daily Patient not taking: Reported on 03/21/2024 02/08/24   Norleen Lynwood ORN, MD    Current Outpatient Medications  Medication Sig Dispense Refill   ASPIRIN  LOW DOSE 81 MG tablet TAKE 1 TABLET(81 MG) BY MOUTH DAILY. SWALLOW WHOLE 90 tablet 3   atorvastatin  (LIPITOR) 80 MG tablet TAKE 1 TABLET(80 MG) BY MOUTH DAILY 90 tablet 3   Calcium -Magnesium-Vitamin D  (CALCIUM  1200+D3 PO) Take 1 tablet by mouth daily.     citalopram  (CELEXA ) 20 MG tablet Take 1 tablet (20 mg total) by  mouth daily. 90 tablet 3   ezetimibe  (ZETIA ) 10 MG tablet TAKE 1 TABLET(10 MG) BY MOUTH DAILY 90 tablet 3   hydrochlorothiazide  (MICROZIDE ) 12.5 MG capsule Take 1 capsule (12.5 mg total) by mouth daily. 90 capsule 3   losartan  (COZAAR ) 100 MG tablet TAKE 1 TABLET(100 MG) BY MOUTH DAILY 90 tablet 3   metoprolol  succinate (TOPROL -XL) 50 MG 24 hr tablet Take 1 tablet (50 mg total) by mouth daily. Take with or immediately following a meal. 90 tablet 3   metroNIDAZOLE  (METROGEL ) 0.75 % gel Apply 1 Application topically 2 (two) times daily. 45 g 2   Multiple Vitamin (MULTIVITAMIN WITH MINERALS) TABS tablet Take 1 tablet by mouth daily.     pantoprazole  (PROTONIX ) 40 MG tablet TAKE 1 TABLET(40 MG) BY MOUTH DAILY 90 tablet 3   tretinoin (RETIN-A) 0.025 % cream Apply topically every other day.     ALPRAZolam  (XANAX ) 1 MG tablet TAKE 1 TABLET BY MOUTH TWICE DAILY AS  NEEDED 60 tablet 2   Bacillus Coagulans-Inulin (PROBIOTIC-PREBIOTIC PO) Take 1 capsule by mouth daily. (Patient not taking: Reported on 03/21/2024)     celecoxib  (CELEBREX ) 200 MG capsule TAKE 1 CAPSULE(200 MG) BY MOUTH TWICE DAILY AS NEEDED FOR MILD PAIN OR MODERATE PAIN (Patient taking differently: as needed. TAKE 1 CAPSULE(200 MG) BY MOUTH TWICE DAILY AS NEEDED FOR MILD PAIN OR MODERATE PAIN) 180 capsule 3   cyclobenzaprine  (FLEXERIL ) 5 MG tablet Take 1 tablet (5 mg total) by mouth 3 (three) times daily as needed for muscle spasms. 60 tablet 1   famotidine  (PEPCID ) 20 MG tablet 1 tab by mouth at bedtime once daily (Patient not taking: Reported on 03/21/2024) 90 tablet 3   Current Facility-Administered Medications  Medication Dose Route Frequency Provider Last Rate Last Admin   0.9 %  sodium chloride  infusion  500 mL Intravenous Continuous Insiya Oshea V, MD        Allergies as of 03/21/2024 - Review Complete 03/21/2024  Allergen Reaction Noted   Codeine Nausea And Vomiting 06/23/2007   Hydrocodone-acetaminophen Itching 03/07/2024    Oxycodone Itching 06/01/2023   Prevnar 20 [pneumococcal 20-val conj vacc] Other (See Comments) 01/28/2023    Family History  Problem Relation Age of Onset   Brain cancer Mother    Heart disease Father        MI in early 44's   Hypertension Father    Brain cancer Maternal Grandmother    Colon cancer Neg Hx    Esophageal cancer Neg Hx    Rectal cancer Neg Hx    Stomach cancer Neg Hx    Colon polyps Neg Hx     Social History   Socioeconomic History   Marital status: Married    Spouse name: Not on file   Number of children: 2   Years of education: Not on file   Highest education level: Not on file  Occupational History   Not on file  Tobacco Use   Smoking status: Former    Current packs/day: 0.00    Average packs/day: 1 pack/day for 35.0 years (35.0 ttl pk-yrs)    Types: Cigarettes    Start date: 04/16/1974    Quit date: 04/16/2009    Years since quitting: 14.9    Passive exposure: Past   Smokeless tobacco: Never   Tobacco comments:    Quit smoking 04/15/09  Vaping Use   Vaping status: Never Used  Substance and Sexual Activity   Alcohol use: Not Currently   Drug use: No   Sexual activity: Yes    Birth control/protection: Surgical    Comment: Hysterectomy  Other Topics Concern   Not on file  Social History Narrative   Married   Social Drivers of Health   Financial Resource Strain: Low Risk  (01/06/2024)   Overall Financial Resource Strain (CARDIA)    Difficulty of Paying Living Expenses: Not hard at all  Food Insecurity: No Food Insecurity (01/06/2024)   Hunger Vital Sign    Worried About Running Out of Food in the Last Year: Never true    Ran Out of Food in the Last Year: Never true  Transportation Needs: No Transportation Needs (01/06/2024)   PRAPARE - Administrator, Civil Service (Medical): No    Lack of Transportation (Non-Medical): No  Physical Activity: Sufficiently Active (01/06/2024)   Exercise Vital Sign    Days of Exercise per Week: 3 days     Minutes of Exercise per Session: 60 min  Stress:  No Stress Concern Present (01/06/2024)   [employer] of Occupational Health - Occupational Stress Questionnaire    Feeling of Stress : Not at all  Social Connections: Moderately Isolated (01/06/2024)   Social Connection and Isolation Panel    Frequency of Communication with Friends and Family: More than three times a week    Frequency of Social Gatherings with Friends and Family: More than three times a week    Attends Religious Services: Never    Database administrator or Organizations: No    Attends Banker Meetings: Never    Marital Status: Married  Catering manager Violence: Not At Risk (01/06/2024)   Humiliation, Afraid, Rape, and Kick questionnaire    Fear of Current or Ex-Partner: No    Emotionally Abused: No    Physically Abused: No    Sexually Abused: No    Review of Systems:  All other review of systems negative except as mentioned in the HPI.  Physical Exam: Vital signs in last 24 hours: BP (!) 157/74   Pulse 71   Temp (!) 97.3 F (36.3 C) (Skin)   Resp 17   Ht 5' 5 (1.651 m)   Wt 175 lb (79.4 kg)   LMP  (LMP Unknown)   SpO2 97%   BMI 29.12 kg/m  General:   Alert, NAD Lungs:  Clear .   Heart:  Regular rate and rhythm Abdomen:  Soft, nontender and nondistended. Neuro/Psych:  Alert and cooperative. Normal mood and affect. A and O x 3  Reviewed labs, radiology imaging, old records and pertinent past GI work up  Patient is appropriate for planned procedure(s) and anesthesia in an ambulatory setting   K. [name] Lael Wetherbee , MD [phone]

## 2024-03-21 NOTE — Progress Notes (Unsigned)
 Vss nad trans to pacu

## 2024-03-21 NOTE — Patient Instructions (Signed)
Handout given on polyps.  YOU HAD AN ENDOSCOPIC PROCEDURE TODAY AT THE [facility]:   Refer to the procedure report that was given to you for any specific questions about what was found during the examination.  If the procedure report does not answer your questions, please call your gastroenterologist to clarify.  If you requested that your care partner not be given the details of your procedure findings, then the procedure report has been included in a sealed envelope for you to review at your convenience later.  YOU SHOULD EXPECT: Some feelings of bloating in the abdomen. Passage of more gas than usual.  Walking can help get rid of the air that was put into your GI tract during the procedure and reduce the bloating. If you had a lower endoscopy (such as a colonoscopy or flexible sigmoidoscopy) you may notice spotting of blood in your stool or on the toilet paper. If you underwent a bowel prep for your procedure, you may not have a normal bowel movement for a few days.  Please Note:  You might notice some irritation and congestion in your nose or some drainage.  This is from the oxygen used during your procedure.  There is no need for concern and it should clear up in a day or so.  SYMPTOMS TO REPORT IMMEDIATELY:  Following lower endoscopy (colonoscopy or flexible sigmoidoscopy):  Excessive amounts of blood in the stool  Significant tenderness or worsening of abdominal pains  Swelling of the abdomen that is new, acute  Fever of 100F or higher   For urgent or emergent issues, a gastroenterologist can be reached at any hour by calling [phone]. Do not use MyChart messaging for urgent concerns.    DIET:  We do recommend a small meal at first, but then you may proceed to your regular diet.  Drink plenty of fluids but you should avoid alcoholic beverages for 24 hours.  ACTIVITY:  You should plan to take it easy for the rest of today and you should NOT DRIVE or use heavy  machinery until tomorrow (because of the sedation medicines used during the test).    FOLLOW UP: Our staff will call the number listed on your records the next business day following your procedure.  We will call around 7:15- 8:00 am to check on you and address any questions or concerns that you may have regarding the information given to you following your procedure. If we do not reach you, we will leave a message.     If any biopsies were taken you will be contacted by phone or by letter within the next 1-3 weeks.  Please call us at [phone] if you have not heard about the biopsies in 3 weeks.    SIGNATURES/CONFIDENTIALITY: You and/or your care partner have signed paperwork which will be entered into your electronic medical record.  These signatures attest to the fact that that the information above on your After Visit Summary has been reviewed and is understood.  Full responsibility of the confidentiality of this discharge information lies with you and/or your care-partner. 

## 2024-03-21 NOTE — Progress Notes (Signed)
 Called to room to assist during endoscopic procedure.  Patient ID and intended procedure confirmed with present staff. Received instructions for my participation in the procedure from the performing physician.

## 2024-03-22 ENCOUNTER — Telehealth: Payer: Self-pay | Admitting: *Deleted

## 2024-03-22 NOTE — Telephone Encounter (Signed)
  Follow up Call-     03/21/2024    8:17 AM  Call back number  Post procedure Call Back phone  # [phone]  Permission to leave phone message Yes     Patient questions:  Do you have a fever, pain , or abdominal swelling? No. Pain Score  0 *  Have you tolerated food without any problems? Yes.    Have you been able to return to your normal activities? Yes.    Do you have any questions about your discharge instructions: Diet   No. Medications  No. Follow up visit  No.  Do you have questions or concerns about your Care? No.  Actions: * If pain score is 4 or above: No action needed, pain <4.

## 2024-03-23 LAB — SURGICAL PATHOLOGY

## 2024-04-11 ENCOUNTER — Other Ambulatory Visit: Payer: Self-pay | Admitting: Internal Medicine

## 2024-04-11 DIAGNOSIS — L299 Pruritus, unspecified: Secondary | ICD-10-CM | POA: Insufficient documentation

## 2024-04-11 MED ORDER — HYDROCHLOROTHIAZIDE 12.5 MG PO CAPS: 12.5000 mg | ORAL_CAPSULE | Freq: Every day | ORAL | 3 refills | Status: AC

## 2024-04-11 MED ORDER — EZETIMIBE 10 MG PO TABS: ORAL_TABLET | ORAL | 3 refills | Status: AC

## 2024-04-11 MED ORDER — CELECOXIB 200 MG PO CAPS: ORAL_CAPSULE | ORAL | 3 refills | Status: AC

## 2024-04-11 NOTE — Telephone Encounter (Signed)
 Copied from CRM #[id number]. Topic: Clinical - Medication Refill >> Apr 11, 2024  9:55 AM Drema MATSU wrote: Medication: hydrochlorothiazide  (MICROZIDE ) 12.5 MG capsule, ezetimibe  (ZETIA ) 10 MG tablet, celecoxib  (CELEBREX ) 200 MG capsule  Has the patient contacted their pharmacy? Yes (Agent: If no, request that the patient contact the pharmacy for the refill. If patient does not wish to contact the pharmacy document the reason why and proceed with request.) advised to call provider  (Agent: If yes, when and what did the pharmacy advise?)  This is the patient's preferred pharmacy:  [id number] [address] [place], [place] - [address] AT [facility] [address] [facility] [address] [place] [address] Phone: [phone] Fax: [phone]  Is this the correct pharmacy for this prescription? Yes If no, delete pharmacy and type the correct one.   Has the prescription been filled recently? No  Is the patient out of the medication? Yes a few pills left of celecoxib    Has the patient been seen for an appointment in the last year OR does the patient have an upcoming appointment? Yes  Can we respond through MyChart? Yes  Agent: Please be advised that Rx refills may take up to 3 business days. We ask that you follow-up with your pharmacy.

## 2024-05-03 ENCOUNTER — Other Ambulatory Visit: Payer: Self-pay | Admitting: Internal Medicine

## 2024-05-10 ENCOUNTER — Other Ambulatory Visit: Payer: Self-pay | Admitting: Internal Medicine

## 2024-05-10 NOTE — Telephone Encounter (Unsigned)
 Copied from CRM #[id number]. Topic: Clinical - Medication Refill >> May 10, 2024  9:50 AM Turkey A wrote: Medication: citalopram  (CELEXA ) 20 MG tablet  Has the patient contacted their pharmacy? Yes (Agent: If no, request that the patient contact the pharmacy for the refill. If patient does not wish to contact the pharmacy document the reason why and proceed with request.) (Agent: If yes, when and what did the pharmacy advise?) Sent fax office did not respond  This is the patient's preferred pharmacy:  [id number] [place], [place] - [address] AT [facility] OF [address] & [facility] [address] [place] [address] Phone: [phone] Fax: [phone]   Is this the correct pharmacy for this prescription? Yes If no, delete pharmacy and type the correct one.   Has the prescription been filled recently? No  Is the patient out of the medication? Yes  Has the patient been seen for an appointment in the last year OR does the patient have an upcoming appointment? Yes  Can we respond through MyChart? Yes  Agent: Please be advised that Rx refills may take up to 3 business days. We ask that you follow-up with your pharmacy.

## 2024-05-11 MED ORDER — CITALOPRAM HYDROBROMIDE 20 MG PO TABS: 20.0000 mg | ORAL_TABLET | Freq: Every day | ORAL | 3 refills | Status: AC

## 2024-07-20 ENCOUNTER — Ambulatory Visit: Payer: Self-pay | Admitting: Gastroenterology

## 2024-08-10 ENCOUNTER — Ambulatory Visit: Admitting: Internal Medicine

## 2024-08-10 ENCOUNTER — Ambulatory Visit: Payer: Self-pay | Admitting: Internal Medicine

## 2024-08-10 ENCOUNTER — Encounter: Payer: Self-pay | Admitting: Internal Medicine

## 2024-08-10 VITALS — BP 132/80 | HR 75 | Temp 97.9°F | Ht 65.0 in | Wt 172.0 lb

## 2024-08-10 DIAGNOSIS — E7800 Pure hypercholesterolemia, unspecified: Secondary | ICD-10-CM

## 2024-08-10 DIAGNOSIS — E559 Vitamin D deficiency, unspecified: Secondary | ICD-10-CM

## 2024-08-10 DIAGNOSIS — J309 Allergic rhinitis, unspecified: Secondary | ICD-10-CM

## 2024-08-10 DIAGNOSIS — I10 Essential (primary) hypertension: Secondary | ICD-10-CM | POA: Diagnosis not present

## 2024-08-10 DIAGNOSIS — E1165 Type 2 diabetes mellitus with hyperglycemia: Secondary | ICD-10-CM

## 2024-08-10 DIAGNOSIS — J069 Acute upper respiratory infection, unspecified: Secondary | ICD-10-CM

## 2024-08-10 LAB — BASIC METABOLIC PANEL WITH GFR
BUN: 18 mg/dL (ref 6–23)
CO2: 30 meq/L (ref 19–32)
Calcium: 9.8 mg/dL (ref 8.4–10.5)
Chloride: 100 meq/L (ref 96–112)
Creatinine, Ser: 0.93 mg/dL (ref 0.40–1.20)
GFR: 61.81 mL/min (ref >60.00)
Glucose, Bld: 115 mg/dL — ABNORMAL HIGH (ref 70–99)
Potassium: 4.1 meq/L (ref 3.5–5.1)
Sodium: 139 meq/L (ref 135–145)

## 2024-08-10 LAB — CBC WITH DIFFERENTIAL/PLATELET
Basophils Absolute: 0 K/uL (ref 0.0–0.1)
Basophils Relative: 0.8 % (ref 0.0–3.0)
Eosinophils Absolute: 0.1 K/uL (ref 0.0–0.7)
Eosinophils Relative: 2.7 % (ref 0.0–5.0)
HCT: 38.2 % (ref 36.0–46.0)
Hemoglobin: 13.2 g/dL (ref 12.0–15.0)
Lymphocytes Relative: 46 % (ref 12.0–46.0)
Lymphs Abs: 2.3 K/uL (ref 0.7–4.0)
MCHC: 34.6 g/dL (ref 30.0–36.0)
MCV: 87.2 fl (ref 78.0–100.0)
Monocytes Absolute: 0.4 K/uL (ref 0.1–1.0)
Monocytes Relative: 8.8 % (ref 3.0–12.0)
Neutro Abs: 2 K/uL (ref 1.4–7.7)
Neutrophils Relative %: 41.7 % — ABNORMAL LOW (ref 43.0–77.0)
Platelets: 159 K/uL (ref 150.0–400.0)
RBC: 4.37 Mil/uL (ref 3.87–5.11)
RDW: 13.7 % (ref 11.5–15.5)
WBC: 4.9 K/uL (ref 4.0–10.5)

## 2024-08-10 LAB — HEPATIC FUNCTION PANEL
ALT: 34 U/L (ref 0–35)
AST: 29 U/L (ref 0–37)
Albumin: 4.4 g/dL (ref 3.5–5.2)
Alkaline Phosphatase: 65 U/L (ref 39–117)
Bilirubin, Direct: 0.1 mg/dL (ref 0.0–0.3)
Total Bilirubin: 0.5 mg/dL (ref 0.2–1.2)
Total Protein: 7 g/dL (ref 6.0–8.3)

## 2024-08-10 LAB — LIPID PANEL
Cholesterol: 116 mg/dL (ref 0–200)
HDL: 41.9 mg/dL (ref >39.00)
LDL Cholesterol: 30 mg/dL (ref 0–99)
NonHDL: 74.46
Total CHOL/HDL Ratio: 3
Triglycerides: 222 mg/dL — ABNORMAL HIGH (ref 0.0–149.0)
VLDL: 44.4 mg/dL — ABNORMAL HIGH (ref 0.0–40.0)

## 2024-08-10 LAB — HEMOGLOBIN A1C: Hgb A1c MFr Bld: 6.5 % (ref 4.6–6.5)

## 2024-08-10 LAB — VITAMIN D 25 HYDROXY (VIT D DEFICIENCY, FRACTURES): VITD: 49.36 ng/mL (ref 30.00–100.00)

## 2024-08-10 MED ORDER — MONTELUKAST SODIUM 10 MG PO TABS: 10.0000 mg | ORAL_TABLET | Freq: Every day | ORAL | 11 refills | Status: AC

## 2024-08-10 MED ORDER — AZITHROMYCIN 250 MG PO TABS: ORAL_TABLET | ORAL | 1 refills | Status: AC

## 2024-08-10 MED ORDER — PROMETHAZINE-DM 6.25-15 MG/5ML PO SYRP: 5.0000 mL | ORAL_SOLUTION | Freq: Four times a day (QID) | ORAL | 0 refills | Status: AC | PRN

## 2024-08-10 NOTE — Patient Instructions (Addendum)
 Please take all new medication as prescribed - the antibiotic, cough medicine, and the generic for Singulair for allergies and any possible asthma  Please continue all other medications as before, and refills have been done if requested.  Please have the pharmacy call with any other refills you may need.  Please continue your efforts at being more active, low cholesterol diet, and weight control.  Please keep your appointments with your specialists as you may have planned  Please go to the LAB at the blood drawing area for the tests to be done  You will be contacted by phone if any changes need to be made immediately.  Otherwise, you will receive a letter about your results with an explanation, but please check with MyChart first.  Please make an Appointment to return in 6 months, or sooner if needed, also with Lab Appointment for testing done 3-5 days before at the FIRST FLOOR Lab (so this is for TWO appointments - please see the scheduling desk as you leave)

## 2024-08-10 NOTE — Progress Notes (Unsigned)
 Patient ID: Molly Lewis, female   DOB: 03/02/1951, 73 y.o.   MRN: [id number]        Chief Complaint: follow up sinusitis, allergies, htn, hld       HPI:  Molly Lewis is a 73 y.o. female  Here with 2-3 days acute onset fever, facial pain, pressure, headache, general weakness and malaise, and greenish d/c, with mild ST and cough, but pt denies chest pain, wheezing, increased sob or doe, orthopnea, PND, increased LE swelling, palpitations, dizziness or syncope.  Does have several wks ongoing nasal allergy  symptoms with clearish congestion, itch and sneezing, without fever, pain, ST, cough, swelling or wheezing.         Wt Readings from Last 3 Encounters:  08/10/24 172 lb (78 kg)  03/21/24 175 lb (79.4 kg)  03/07/24 175 lb (79.4 kg)   BP Readings from Last 3 Encounters:  08/10/24 132/80  03/21/24 (!) 91/49  02/08/24 122/78         Past Medical History:  Diagnosis Date   Allergic rhinitis, cause unspecified 07/18/2011   Allergy     ANXIETY 06/23/2007   Arthritis    BREAST BIOPSY, HX OF 06/23/2007   benign   Bursitis    Cataract    bilateral lens implants   Chronic back pain    Chronic constipation    i have had it all my life and the last time i had a colonoscopy they tol me i have a distened colon because of it    COLONIC POLYPS, HX OF 06/27/2008   COMMON MIGRAINE 06/23/2007   occasional   DEPRESSION 06/23/2007   Detached retina    GERD 06/23/2007   HYPERLIPIDEMIA 06/23/2007   Hypertension    Impaired glucose tolerance 02/16/2011   INSOMNIA-SLEEP DISORDER-UNSPEC 06/26/2009   Irritable bowel syndrome 06/23/2007   diet controlled   Microhematuria 07/31/2015   OSTEOARTHRITIS, LUMBAR SPINE 06/23/2007   OSTEOPENIA 07/11/2008   Pre-diabetes    Past Surgical History:  Procedure Laterality Date   APPENDECTOMY     BIOPSY  09/19/2020   Procedure: BIOPSY;  Surgeon: Wilhelmenia Aloha Raddle., MD;  Location: [facility] ENDOSCOPY;  Service: Gastroenterology;;   CATARACT EXTRACTION      BIL   colonoscopy  02/2019   COLONOSCOPY  09/2013, 07/2008, 08/2003, 07/1999   multiple   COLONOSCOPY WITH PROPOFOL  N/A 03/29/2019   Procedure: COLONOSCOPY WITH PROPOFOL ;  Surgeon: Wilhelmenia Aloha Raddle., MD;  Location: [facility];  Service: Gastroenterology;  Laterality: N/A;   COLONOSCOPY WITH PROPOFOL  N/A 09/19/2020   Procedure: COLONOSCOPY WITH PROPOFOL ;  Surgeon: Mansouraty, Aloha Raddle., MD;  Location: [facility] ENDOSCOPY;  Service: Gastroenterology;  Laterality: N/A;   ENDOSCOPIC MUCOSAL RESECTION N/A 03/29/2019   Procedure: ENDOSCOPIC MUCOSAL RESECTION;  Surgeon: Wilhelmenia Aloha Raddle., MD;  Location: [facility] ENDOSCOPY;  Service: Gastroenterology;  Laterality: N/A;   HEMOSTASIS CLIP PLACEMENT  03/29/2019   Procedure: HEMOSTASIS CLIP PLACEMENT;  Surgeon: Wilhelmenia Aloha Raddle., MD;  Location: [facility];  Service: Gastroenterology;;   POLYPECTOMY  09/19/2020   Procedure: POLYPECTOMY;  Surgeon: Wilhelmenia Aloha Raddle., MD;  Location: [facility] ENDOSCOPY;  Service: Gastroenterology;;   REPLACEMENT TOTAL KNEE Right 04/2020   RETINAL DETACHMENT SURGERY Right 01/2019   RETINAL TEAR REPAIR CRYOTHERAPY Right    SUBMUCOSAL LIFTING INJECTION  03/29/2019   Procedure: SUBMUCOSAL LIFTING INJECTION;  Surgeon: Wilhelmenia Aloha Raddle., MD;  Location: [facility];  Service: Gastroenterology;;   TOTAL ABDOMINAL HYSTERECTOMY     UPPER GASTROINTESTINAL ENDOSCOPY  10/2003   WISDOM TOOTH EXTRACTION  reports that she quit smoking about 15 years ago. Her smoking use included cigarettes. She started smoking about 50 years ago. She has a 35 pack-year smoking history. She has been exposed to tobacco smoke. She has never used smokeless tobacco. She reports that she does not currently use alcohol. She reports that she does not use drugs. family history includes Brain cancer in her maternal grandmother and mother; Heart disease in her father; Hypertension in her father. Allergies  Allergen Reactions   Codeine Nausea And  Vomiting    Violent vomiting   Hydrocodone-Acetaminophen Itching   Oxycodone Itching   Prevnar 20 [Pneumococcal 20-Val Conj Vacc] Other (See Comments)    Rash to torso   Current Outpatient Medications on File Prior to Visit  Medication Sig Dispense Refill   ALPRAZolam  (XANAX ) 1 MG tablet TAKE 1 TABLET BY MOUTH TWICE DAILY AS NEEDED 60 tablet 2   ASPIRIN  LOW DOSE 81 MG tablet TAKE 1 TABLET(81 MG) BY MOUTH DAILY. SWALLOW WHOLE 90 tablet 3   atorvastatin  (LIPITOR) 80 MG tablet TAKE 1 TABLET(80 MG) BY MOUTH DAILY 90 tablet 3   Bacillus Coagulans-Inulin (PROBIOTIC-PREBIOTIC PO) Take 1 capsule by mouth daily. (Patient not taking: Reported on 03/21/2024)     Calcium -Magnesium-Vitamin D  (CALCIUM  1200+D3 PO) Take 1 tablet by mouth daily.     celecoxib  (CELEBREX ) 200 MG capsule TAKE 1 CAPSULE(200 MG) BY MOUTH TWICE DAILY AS NEEDED FOR MILD PAIN OR MODERATE PAIN 180 capsule 3   citalopram  (CELEXA ) 20 MG tablet Take 1 tablet (20 mg total) by mouth daily. 90 tablet 3   cyclobenzaprine  (FLEXERIL ) 5 MG tablet Take 1 tablet (5 mg total) by mouth 3 (three) times daily as needed for muscle spasms. 60 tablet 1   diclofenac (VOLTAREN) 75 MG EC tablet Oral     etodolac (LODINE) 400 MG tablet Oral     ezetimibe  (ZETIA ) 10 MG tablet TAKE 1 TABLET(10 MG) BY MOUTH DAILY 90 tablet 3   famotidine  (PEPCID ) 20 MG tablet 1 tab by mouth at bedtime once daily (Patient not taking: Reported on 03/21/2024) 90 tablet 3   hydrochlorothiazide  (MICROZIDE ) 12.5 MG capsule Take 1 capsule (12.5 mg total) by mouth daily. 90 capsule 3   losartan  (COZAAR ) 100 MG tablet TAKE 1 TABLET(100 MG) BY MOUTH DAILY 90 tablet 3   metoprolol  succinate (TOPROL -XL) 50 MG 24 hr tablet TAKE 1 TABLET(50 MG) BY MOUTH DAILY WITH OR IMMEDIATELY FOLLOWING A MEAL 90 tablet 3   metroNIDAZOLE  (METROGEL ) 0.75 % gel Apply 1 Application topically 2 (two) times daily. 45 g 2   Multiple Vitamin (MULTIVITAMIN WITH MINERALS) TABS tablet Take 1 tablet by mouth daily.      pantoprazole  (PROTONIX ) 40 MG tablet TAKE 1 TABLET(40 MG) BY MOUTH DAILY 90 tablet 3   tretinoin (RETIN-A) 0.025 % cream Apply topically every other day.     No current facility-administered medications on file prior to visit.        ROS:  All others reviewed and negative.  Objective        PE:  BP 132/80 (BP Location: Right Arm, Patient Position: Sitting, Cuff Size: Normal)   Pulse 75   Temp 97.9 F (36.6 C) (Oral)   Ht 5' 5 (1.651 m)   Wt 172 lb (78 kg)   LMP  (LMP Unknown)   SpO2 98%   BMI 28.62 kg/m                 Constitutional: Pt appears mild ill  HENT: Head: NCAT.                Right Ear: External ear normal.                 Left Ear: External ear normal. Bilat tm's with mild erythema.  Max sinus areas mild tender.  Pharynx with mild erythema, no exudate               Eyes: . Pupils are equal, round, and reactive to light. Conjunctivae and EOM are normal               Nose: without d/c or deformity               Neck: Neck supple. Gross normal ROM               Cardiovascular: Normal rate and regular rhythm.                 Pulmonary/Chest: Effort normal and breath sounds without rales or wheezing.                              Neurological: Pt is alert. At baseline orientation, motor grossly intact               Skin: Skin is warm. No rashes, no other new lesions, LE edema - none               Psychiatric: Pt behavior is normal without agitation   Micro: none  Cardiac tracings I have personally interpreted today:  none  Pertinent Radiological findings (summarize): none   Lab Results  Component Value Date   WBC 4.9 08/10/2024   HGB 13.2 08/10/2024   HCT 38.2 08/10/2024   PLT 159.0 08/10/2024   GLUCOSE 115 (H) 08/10/2024   CHOL 116 08/10/2024   TRIG 222.0 (H) 08/10/2024   HDL 41.90 08/10/2024   LDLDIRECT 90.0 10/15/2020   LDLCALC 30 08/10/2024   ALT 34 08/10/2024   AST 29 08/10/2024   NA 139 08/10/2024   K 4.1 08/10/2024   CL 100  08/10/2024   CREATININE 0.93 08/10/2024   BUN 18 08/10/2024   CO2 30 08/10/2024   TSH 1.16 02/08/2024   HGBA1C 6.5 08/10/2024   MICROALBUR <0.7 02/08/2024   Assessment/Plan:  Molly Lewis is a 73 y.o. White or Caucasian [1] female with  has a past medical history of Allergic rhinitis, cause unspecified (07/18/2011), Allergy , ANXIETY (06/23/2007), Arthritis, BREAST BIOPSY, HX OF (06/23/2007), Bursitis, Cataract, Chronic back pain, Chronic constipation, COLONIC POLYPS, HX OF (06/27/2008), COMMON MIGRAINE (06/23/2007), DEPRESSION (06/23/2007), Detached retina, GERD (06/23/2007), HYPERLIPIDEMIA (06/23/2007), Hypertension, Impaired glucose tolerance (02/16/2011), INSOMNIA-SLEEP DISORDER-UNSPEC (06/26/2009), Irritable bowel syndrome (06/23/2007), Microhematuria (07/31/2015), OSTEOARTHRITIS, LUMBAR SPINE (06/23/2007), OSTEOPENIA (07/11/2008), and Pre-diabetes.  Hypertension BP Readings from Last 3 Encounters:  08/10/24 132/80  03/21/24 (!) 91/49  02/08/24 122/78   Stable, pt to continue medical treatment hct 12.5 every day, losartan  100 mg every day, toprol  xl 50 qd   Hyperlipidemia Lab Results  Component Value Date   LDLCALC 30 08/10/2024   Stable, pt to continue current statin lipitor 80 qd   Diabetes (HCC) Lab Results  Component Value Date   HGBA1C 6.5 08/10/2024   Stable, pt to continue current medical treatment  - diet, wt control   Allergic rhinitis Mild to mod, for add singulair  10 mg every day, to f/u any worsening symptoms or concerns  Upper respiratory  infection Mild to mod, for antibx course, zpack, cough med prn,  to f/u any worsening symptoms or concerns  Followup: Return in about 6 months (around 02/08/2025).  Lynwood Rush, MD 08/13/2024 1:35 PM [facility]

## 2024-08-10 NOTE — Progress Notes (Signed)
 The test results show that your current treatment is OK, as the tests are stable.  Please continue the same plan.  There is no other need for change of treatment or further evaluation based on these results, at this time.  thanks

## 2024-08-13 ENCOUNTER — Encounter: Payer: Self-pay | Admitting: Internal Medicine

## 2024-08-13 DIAGNOSIS — J069 Acute upper respiratory infection, unspecified: Secondary | ICD-10-CM | POA: Insufficient documentation

## 2024-08-13 NOTE — Assessment & Plan Note (Signed)
 Lab Results  Component Value Date   HGBA1C 6.5 08/10/2024   Stable, pt to continue current medical treatment  - diet, wt control

## 2024-08-13 NOTE — Assessment & Plan Note (Signed)
 Mild to mod, for add singulair 10 mg every day,  to f/u any worsening symptoms or concerns

## 2024-08-13 NOTE — Assessment & Plan Note (Signed)
 Lab Results  Component Value Date   LDLCALC 30 08/10/2024   Stable, pt to continue current statin lipitor 80 qd

## 2024-08-13 NOTE — Assessment & Plan Note (Signed)
 BP Readings from Last 3 Encounters:  08/10/24 132/80  03/21/24 (!) 91/49  02/08/24 122/78   Stable, pt to continue medical treatment hct 12.5 every day, losartan  100 mg every day, toprol  xl 50 qd

## 2024-08-13 NOTE — Assessment & Plan Note (Signed)
Mild to mod, for antibx course, zpack,  cough med prn,  to f/u any worsening symptoms or concerns

## 2024-08-22 ENCOUNTER — Other Ambulatory Visit: Payer: Self-pay | Admitting: Internal Medicine

## 2024-08-22 MED ORDER — ALPRAZOLAM 1 MG PO TABS: 1.0000 mg | ORAL_TABLET | Freq: Two times a day (BID) | ORAL | 2 refills | Status: AC | PRN

## 2024-08-22 NOTE — Telephone Encounter (Signed)
 Copied from CRM #[id number]. Topic: Clinical - Medication Refill >> Aug 22, 2024  2:43 PM Drema MATSU wrote: Medication: ALPRAZolam  (XANAX ) 1 MG tablet  Has the patient contacted their pharmacy? Yes (Agent: If no, request that the patient contact the pharmacy for the refill. If patient does not wish to contact the pharmacy document the reason why and proceed with request.) sent over a request  (Agent: If yes, when and what did the pharmacy advise?)  This is the patient's preferred pharmacy:  [id number] [address], [place] - [address] AT [facility] [address] [facility] [address] [place] [address] Phone: [phone] Fax: [phone]  Is this the correct pharmacy for this prescription? Yes If no, delete pharmacy and type the correct one.   Has the prescription been filled recently? Yes  Is the patient out of the medication? Yes  Has the patient been seen for an appointment in the last year OR does the patient have an upcoming appointment? Yes  Can we respond through MyChart? No  Agent: Please be advised that Rx refills may take up to 3 business days. We ask that you follow-up with your pharmacy.

## 2024-09-05 ENCOUNTER — Telehealth: Payer: Self-pay

## 2024-09-05 NOTE — Telephone Encounter (Signed)
 Pharmacy Patient Advocate Encounter   Received notification from Onbase that prior authorization for Mounjaro  5MG /0.5ML auto-injectors  is due for renewal.   Insurance verification completed.   The patient is insured through [employer].  Action: Medication has been discontinued. Archived Key: [id number]

## 2024-11-09 ENCOUNTER — Ambulatory Visit: Admitting: Family Medicine

## 2025-01-09 ENCOUNTER — Ambulatory Visit
